# Patient Record
Sex: Female | Born: 1975 | Race: Black or African American | Hispanic: No | Marital: Married | Smoking: Never smoker
Health system: Southern US, Community
[De-identification: ages and names within clinical notes are randomized; demographics above are authoritative.]

## PROBLEM LIST (undated history)

## (undated) DIAGNOSIS — M1990 Unspecified osteoarthritis, unspecified site: Secondary | ICD-10-CM

## (undated) DIAGNOSIS — T7840XA Allergy, unspecified, initial encounter: Secondary | ICD-10-CM

## (undated) DIAGNOSIS — M549 Dorsalgia, unspecified: Secondary | ICD-10-CM

## (undated) DIAGNOSIS — D649 Anemia, unspecified: Secondary | ICD-10-CM

## (undated) DIAGNOSIS — F419 Anxiety disorder, unspecified: Secondary | ICD-10-CM

## (undated) DIAGNOSIS — E559 Vitamin D deficiency, unspecified: Secondary | ICD-10-CM

## (undated) DIAGNOSIS — J45909 Unspecified asthma, uncomplicated: Secondary | ICD-10-CM

## (undated) DIAGNOSIS — M2550 Pain in unspecified joint: Secondary | ICD-10-CM

## (undated) DIAGNOSIS — M7989 Other specified soft tissue disorders: Secondary | ICD-10-CM

## (undated) DIAGNOSIS — Z91018 Allergy to other foods: Secondary | ICD-10-CM

## (undated) HISTORY — DX: Other specified soft tissue disorders: M79.89

## (undated) HISTORY — PX: COSMETIC SURGERY: SHX468

## (undated) HISTORY — DX: Anemia, unspecified: D64.9

## (undated) HISTORY — DX: Vitamin D deficiency, unspecified: E55.9

## (undated) HISTORY — DX: Unspecified osteoarthritis, unspecified site: M19.90

## (undated) HISTORY — DX: Allergy to other foods: Z91.018

## (undated) HISTORY — DX: Pain in unspecified joint: M25.50

## (undated) HISTORY — DX: Dorsalgia, unspecified: M54.9

## (undated) HISTORY — DX: Allergy, unspecified, initial encounter: T78.40XA

## (undated) HISTORY — DX: Anxiety disorder, unspecified: F41.9

---

## 2001-10-09 HISTORY — PX: GASTRIC BYPASS: SHX52

## 2012-04-11 ENCOUNTER — Encounter (HOSPITAL_BASED_OUTPATIENT_CLINIC_OR_DEPARTMENT_OTHER): Payer: Self-pay | Admitting: *Deleted

## 2012-04-11 ENCOUNTER — Emergency Department (HOSPITAL_BASED_OUTPATIENT_CLINIC_OR_DEPARTMENT_OTHER)
Admission: EM | Admit: 2012-04-11 | Discharge: 2012-04-11 | Disposition: A | Discharge status: Home / Self Care | Payer: No Typology Code available for payment source | Attending: Emergency Medicine | Admitting: Emergency Medicine

## 2012-04-11 DIAGNOSIS — Z91013 Allergy to seafood: Secondary | ICD-10-CM | POA: Insufficient documentation

## 2012-04-11 DIAGNOSIS — M542 Cervicalgia: Secondary | ICD-10-CM | POA: Insufficient documentation

## 2012-04-11 DIAGNOSIS — M7918 Myalgia, other site: Secondary | ICD-10-CM

## 2012-04-11 DIAGNOSIS — J45909 Unspecified asthma, uncomplicated: Secondary | ICD-10-CM | POA: Insufficient documentation

## 2012-04-11 HISTORY — DX: Unspecified asthma, uncomplicated: J45.909

## 2012-04-11 MED ORDER — DIAZEPAM 5 MG PO TABS
5.0000 mg | ORAL_TABLET | Freq: Once | ORAL | Status: AC
  Administered 2012-04-11: 5 mg via ORAL
  Filled 2012-04-11: qty 1

## 2012-04-11 MED ORDER — IBUPROFEN 600 MG PO TABS: 600.0000 mg | ORAL_TABLET | Freq: Four times a day (QID) | ORAL | Status: AC | PRN

## 2012-04-11 MED ORDER — DIAZEPAM 5 MG PO TABS: 5.0000 mg | ORAL_TABLET | Freq: Two times a day (BID) | ORAL | Status: AC

## 2012-04-11 MED ORDER — OXYCODONE-ACETAMINOPHEN 5-325 MG PO TABS
1.0000 | ORAL_TABLET | Freq: Once | ORAL | Status: AC
  Administered 2012-04-11: 1 via ORAL
  Filled 2012-04-11: qty 1

## 2012-04-11 MED ORDER — ONDANSETRON 4 MG PO TBDP
4.0000 mg | ORAL_TABLET | Freq: Once | ORAL | Status: AC
  Administered 2012-04-11: 4 mg via ORAL
  Filled 2012-04-11: qty 1

## 2012-04-11 MED ORDER — OXYCODONE-ACETAMINOPHEN 5-325 MG PO TABS: 1.0000 | ORAL_TABLET | ORAL | Status: AC | PRN
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
## 2012-10-09 HISTORY — PX: KNEE SURGERY: SHX244

## 2013-10-21 ENCOUNTER — Ambulatory Visit (INDEPENDENT_AMBULATORY_CARE_PROVIDER_SITE_OTHER): Payer: BC Managed Care – PPO | Admitting: Obstetrics & Gynecology

## 2013-10-21 ENCOUNTER — Encounter: Payer: Self-pay | Admitting: Obstetrics & Gynecology

## 2013-10-21 VITALS — BP 126/87 | HR 96 | Resp 16 | Ht 66.0 in | Wt 297.0 lb

## 2013-10-21 DIAGNOSIS — Z0000 Encounter for general adult medical examination without abnormal findings: Secondary | ICD-10-CM

## 2013-10-21 DIAGNOSIS — Z124 Encounter for screening for malignant neoplasm of cervix: Secondary | ICD-10-CM

## 2013-10-21 DIAGNOSIS — Z01419 Encounter for gynecological examination (general) (routine) without abnormal findings: Secondary | ICD-10-CM

## 2013-10-21 DIAGNOSIS — Z1151 Encounter for screening for human papillomavirus (HPV): Secondary | ICD-10-CM

## 2013-10-21 DIAGNOSIS — Z23 Encounter for immunization: Secondary | ICD-10-CM

## 2013-10-21 DIAGNOSIS — Z113 Encounter for screening for infections with a predominantly sexual mode of transmission: Secondary | ICD-10-CM

## 2013-10-21 MED ORDER — NORETHINDRONE-ETH ESTRADIOL 0.4-35 MG-MCG PO TABS: ORAL_TABLET | ORAL | Status: DC

## 2013-10-21 NOTE — Progress Notes (Signed)
Subjective:    Lisa Tran is a 38 y.o. female who presents for an annual exam. The patient has no complaints today except for some dysuria since the beginning of the year. She tried OTC UTI pills with some help. The patient is sexually active. GYN screening history: last pap: was normal. The patient wears seatbelts: yes. The patient participates in regular exercise: yes. (zumba) Has the patient ever been transfused or tattooed?: no. The patient reports that there is not domestic violence in her life.   Menstrual History: OB History   Grav Para Term Preterm Abortions TAB SAB Ect Mult Living   0 0 0 0 0 0 0 0 0 0       Menarche age: 32 [place]: 45   Patient's last menstrual period was 10/07/2013.    The following portions of the patient's history were reviewed and updated as appropriate: allergies, current medications, past family history, past medical history, past social history, past surgical history and problem list.  Review of Systems A comprehensive review of systems was negative. Married for 7 years, denies dyspareunia. Uses OTC for contraception. She wants a flu vaccine soon, when she doesn't have a cold. 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 No mammogram ever.   Objective:    BP 126/87  Pulse 96  Resp 16  Ht 5\' 6"  (1.676 m)  Wt 297 lb (134.718 kg)  BMI 47.96 kg/m2  LMP 10/07/2013  General Appearance:    Alert, cooperative, no distress, appears stated age  Head:    Normocephalic, without obvious abnormality, atraumatic  Eyes:    PERRL, conjunctiva/corneas clear, EOM's intact, fundi    benign, both eyes  Ears:    Normal TM's and external ear canals, both ears  Nose:   Nares normal, septum midline, mucosa normal, no drainage    or sinus tenderness  Throat:   Lips, mucosa, and tongue normal; teeth and gums normal  Neck:   Supple, symmetrical, trachea midline, no adenopathy;    thyroid:  no enlargement/tenderness/nodules; no carotid   bruit or JVD  Back:      Symmetric, no curvature, ROM normal, no CVA tenderness  Lungs:     Clear to auscultation bilaterally, respirations unlabored  Chest Wall:    No tenderness or deformity   Heart:    Regular rate and rhythm, S1 and S2 normal, no murmur, rub   or gallop  Breast Exam:    No tenderness, masses, or nipple abnormality  Abdomen:     Soft, non-tender, bowel sounds active all four quadrants,    no masses, no organomegaly  Genitalia:    Normal female without lesion, discharge or tenderness, NSSmid plane, NT, mobile, no adnexal masses appreciated.     Extremities:   Extremities normal, atraumatic, no cyanosis or edema  Pulses:   2+ and symmetric all extremities  Skin:   Skin color, texture, turgor normal, no rashes or lesions  Lymph nodes:   Cervical, supraclavicular, and axillary nodes normal  Neurologic:   CNII-XII intact, normal strength, sensation and reflexes    throughout  .    Assessment:    Healthy female exam.    Plan:     Breast self exam technique reviewed and patient encouraged to perform self-exam monthly. Chlamydia specimen. GC specimen. Mammogram. Thin prep Pap smear. with cotesting Refer to bariatric center Flu vaccine when well Urine culture

## 2013-10-21 NOTE — Patient Instructions (Signed)

## 2013-10-23 LAB — CULTURE, URINE COMPREHENSIVE
Colony Count: NO GROWTH
Organism ID, Bacteria: NO GROWTH

## 2013-11-03 ENCOUNTER — Other Ambulatory Visit: Payer: Self-pay | Admitting: *Deleted

## 2013-11-03 DIAGNOSIS — IMO0001 Reserved for inherently not codable concepts without codable children: Secondary | ICD-10-CM

## 2013-11-03 MED ORDER — NORETHINDRONE-ETH ESTRADIOL 0.4-35 MG-MCG PO TABS: ORAL_TABLET | ORAL | Status: DC

## 2013-11-03 NOTE — Telephone Encounter (Signed)
Pt called stating that her OCP RX was not at the [facility], even though there is a confirmation receipt.  RX resent to [facility] per original orders.

## 2013-12-15 ENCOUNTER — Ambulatory Visit (HOSPITAL_COMMUNITY)
Admission: RE | Admit: 2013-12-15 | Discharge: 2013-12-15 | Disposition: A | Discharge status: Home / Self Care | Payer: BC Managed Care – PPO | Source: Ambulatory Visit | Attending: Obstetrics & Gynecology | Admitting: Obstetrics & Gynecology

## 2013-12-15 DIAGNOSIS — Z1231 Encounter for screening mammogram for malignant neoplasm of breast: Secondary | ICD-10-CM | POA: Insufficient documentation

## 2013-12-15 DIAGNOSIS — Z0000 Encounter for general adult medical examination without abnormal findings: Secondary | ICD-10-CM

## 2014-11-13 ENCOUNTER — Other Ambulatory Visit: Payer: Self-pay | Admitting: Obstetrics & Gynecology

## 2014-11-13 DIAGNOSIS — Z3041 Encounter for surveillance of contraceptive pills: Secondary | ICD-10-CM

## 2014-11-23 ENCOUNTER — Ambulatory Visit: Payer: Self-pay | Admitting: Obstetrics & Gynecology

## 2014-12-08 ENCOUNTER — Other Ambulatory Visit: Payer: Self-pay | Admitting: General Practice

## 2014-12-08 DIAGNOSIS — Z1231 Encounter for screening mammogram for malignant neoplasm of breast: Secondary | ICD-10-CM

## 2014-12-23 ENCOUNTER — Ambulatory Visit (INDEPENDENT_AMBULATORY_CARE_PROVIDER_SITE_OTHER): Payer: BLUE CROSS/BLUE SHIELD | Admitting: Obstetrics & Gynecology

## 2014-12-23 ENCOUNTER — Ambulatory Visit (INDEPENDENT_AMBULATORY_CARE_PROVIDER_SITE_OTHER): Payer: BLUE CROSS/BLUE SHIELD

## 2014-12-23 ENCOUNTER — Encounter: Payer: Self-pay | Admitting: Obstetrics & Gynecology

## 2014-12-23 VITALS — BP 114/74 | HR 77 | Resp 16 | Ht 66.0 in | Wt 302.0 lb

## 2014-12-23 DIAGNOSIS — Z1231 Encounter for screening mammogram for malignant neoplasm of breast: Secondary | ICD-10-CM

## 2014-12-23 DIAGNOSIS — Z01419 Encounter for gynecological examination (general) (routine) without abnormal findings: Secondary | ICD-10-CM

## 2014-12-23 DIAGNOSIS — Z3041 Encounter for surveillance of contraceptive pills: Secondary | ICD-10-CM | POA: Diagnosis not present

## 2014-12-23 DIAGNOSIS — Z124 Encounter for screening for malignant neoplasm of cervix: Secondary | ICD-10-CM | POA: Diagnosis not present

## 2014-12-23 DIAGNOSIS — N760 Acute vaginitis: Secondary | ICD-10-CM

## 2014-12-23 DIAGNOSIS — R922 Inconclusive mammogram: Secondary | ICD-10-CM

## 2014-12-23 DIAGNOSIS — A499 Bacterial infection, unspecified: Secondary | ICD-10-CM | POA: Diagnosis not present

## 2014-12-23 DIAGNOSIS — Z0000 Encounter for general adult medical examination without abnormal findings: Secondary | ICD-10-CM

## 2014-12-23 DIAGNOSIS — Z1151 Encounter for screening for human papillomavirus (HPV): Secondary | ICD-10-CM

## 2014-12-23 DIAGNOSIS — B9689 Other specified bacterial agents as the cause of diseases classified elsewhere: Secondary | ICD-10-CM

## 2014-12-23 LAB — WET PREP FOR TRICH, YEAST, CLUE
Trich, Wet Prep: NONE SEEN
Yeast Wet Prep HPF POC: NONE SEEN

## 2014-12-23 MED ORDER — METRONIDAZOLE 500 MG PO TABS: 500.0000 mg | ORAL_TABLET | Freq: Two times a day (BID) | ORAL | Status: DC

## 2014-12-23 MED ORDER — NORETHINDRONE-ETH ESTRADIOL 0.4-35 MG-MCG PO TABS: ORAL_TABLET | ORAL | Status: DC

## 2014-12-23 NOTE — Progress Notes (Signed)
Subjective:    Lisa Tran is a 39 y.o. M AA P0  female who presents for an annual exam. The patient has no complaints today. She needs a refill of her OCPs.  The patient is sexually active. GYN screening history: last pap: was normal. The patient wears seatbelts: yes. The patient participates in regular exercise: yes. Has the patient ever been transfused or tattooed?: no. The patient reports that there is not domestic violence in her life.   Menstrual History: OB History    Gravida Para Term Preterm AB TAB SAB Ectopic Multiple Living   0 0 0 0 0 0 0 0 0 0       Menarche age: 40  Patient's last menstrual period was 12/14/2014.    The following portions of the patient's history were reviewed and updated as appropriate: allergies, current medications, past family history, past medical history, past social history, past surgical history and problem list.  Review of Systems Pertinent items are noted in HPI. Married for 8 years.Declines a flu vaccine.   Objective:    BP 114/74 mmHg  Pulse 77  Resp 16  Ht 5\' 6"  (1.676 m)  Wt 302 lb (136.986 kg)  BMI 48.77 kg/m2  LMP 12/14/2014  General Appearance:    Alert, cooperative, no distress, appears stated age  Head:    Normocephalic, without obvious abnormality, atraumatic  Eyes:    PERRL, conjunctiva/corneas clear, EOM's intact, fundi    benign, both eyes  Ears:    Normal TM's and external ear canals, both ears  Nose:   Nares normal, septum midline, mucosa normal, no drainage    or sinus tenderness  Throat:   Lips, mucosa, and tongue normal; teeth and gums normal  Neck:   Supple, symmetrical, trachea midline, no adenopathy;    thyroid:  no enlargement/tenderness/nodules; no carotid   bruit or JVD  Back:     Symmetric, no curvature, ROM normal, no CVA tenderness  Lungs:     Clear to auscultation bilaterally, respirations unlabored  Chest Wall:    No tenderness or deformity   Heart:    Regular rate and rhythm, S1 and S2 normal, no  murmur, rub   or gallop  Breast Exam:    No tenderness, masses, or nipple abnormality  Abdomen:     Soft, non-tender, bowel sounds active all four quadrants,    no masses, no organomegaly  Genitalia:    Normal female without lesion, discharge or tenderness, frothy vaginal discharge, NSSmid plane, NT, no palpable adnexal masses     Extremities:   Extremities normal, atraumatic, no cyanosis or edema  Pulses:   2+ and symmetric all extremities  Skin:   Skin color, texture, turgor normal, no rashes or lesions  Lymph nodes:   Cervical, supraclavicular, and axillary nodes normal  Neurologic:   CNII-XII intact, normal strength, sensation and reflexes    throughout  .    Assessment:    Healthy female exam.  Probable BV Plan:     Breast self exam technique reviewed and patient encouraged to perform self-exam monthly. Mammogram. Thin prep Pap smear. with cotesting Treat with flagyl Refer to bariatrics

## 2014-12-23 NOTE — Addendum Note (Signed)
Addended by: Asencion Islam on: 12/23/2014 10:01 AM   Modules accepted: Orders

## 2014-12-24 ENCOUNTER — Other Ambulatory Visit: Payer: Self-pay | Admitting: General Practice

## 2014-12-24 ENCOUNTER — Ambulatory Visit: Payer: Self-pay | Admitting: Obstetrics & Gynecology

## 2014-12-24 ENCOUNTER — Telehealth: Payer: Self-pay | Admitting: *Deleted

## 2014-12-24 DIAGNOSIS — R928 Other abnormal and inconclusive findings on diagnostic imaging of breast: Secondary | ICD-10-CM

## 2014-12-24 LAB — CYTOLOGY - PAP

## 2014-12-24 NOTE — Telephone Encounter (Signed)
Pt notified of Wet prep results and she has started her Flagyl

## 2015-01-01 ENCOUNTER — Ambulatory Visit
Admission: RE | Admit: 2015-01-01 | Discharge: 2015-01-01 | Disposition: A | Discharge status: Home / Self Care | Payer: BLUE CROSS/BLUE SHIELD | Source: Ambulatory Visit | Attending: General Practice | Admitting: General Practice

## 2015-01-01 DIAGNOSIS — R928 Other abnormal and inconclusive findings on diagnostic imaging of breast: Secondary | ICD-10-CM

## 2015-02-25 DIAGNOSIS — Z9889 Other specified postprocedural states: Secondary | ICD-10-CM | POA: Insufficient documentation

## 2015-02-25 DIAGNOSIS — Z9884 Bariatric surgery status: Secondary | ICD-10-CM | POA: Insufficient documentation

## 2015-03-16 DIAGNOSIS — E611 Iron deficiency: Secondary | ICD-10-CM | POA: Insufficient documentation

## 2015-09-28 ENCOUNTER — Emergency Department
Admission: EM | Admit: 2015-09-28 | Discharge: 2015-09-28 | Disposition: A | Discharge status: Home / Self Care | Payer: BLUE CROSS/BLUE SHIELD | Source: Home / Self Care | Attending: Family Medicine | Admitting: Family Medicine

## 2015-09-28 ENCOUNTER — Encounter: Payer: Self-pay | Admitting: *Deleted

## 2015-09-28 ENCOUNTER — Emergency Department (INDEPENDENT_AMBULATORY_CARE_PROVIDER_SITE_OTHER): Payer: BLUE CROSS/BLUE SHIELD

## 2015-09-28 DIAGNOSIS — M542 Cervicalgia: Secondary | ICD-10-CM

## 2015-09-28 MED ORDER — CYCLOBENZAPRINE HCL 10 MG PO TABS: ORAL_TABLET | ORAL | Status: DC

## 2015-09-28 MED ORDER — NITROFURANTOIN MONOHYD MACRO 100 MG PO CAPS: 100.0000 mg | ORAL_CAPSULE | Freq: Two times a day (BID) | ORAL | Status: DC

## 2015-09-28 MED ORDER — PREDNISONE 20 MG PO TABS: 20.0000 mg | ORAL_TABLET | Freq: Two times a day (BID) | ORAL | Status: DC

## 2015-09-28 NOTE — ED Notes (Signed)
Pt c/o almost 2 weeks of neck pain without injury. Taken bayer and Aleve and used icy hot and heat with minimal relief.

## 2015-09-28 NOTE — ED Provider Notes (Signed)
CSN: [id number]     Arrival date & time 09/28/15  1325 History   First MD Initiated Contact with Patient 09/28/15 1332     Chief Complaint  Patient presents with  . Neck Pain      HPI Comments: Patient complains of onset of left side neck pain two weeks ago that became worse yesterday.  The pain radiates into her left trapezius area.  She recalls no injury or change in physical activities.  She has difficulty sleeping as a result of the pain.  She has developed intermittent lancinating pain with certain movements.  Her left neck pain is exacerbated by rotating her neck to the left, and flexing her neck laterally to the right.  Patient is a 39 y.o. female presenting with neck injury. The history is provided by the patient.  Neck Injury This is a new problem. Episode onset: 2 weeks ago. The problem occurs constantly. The problem has been gradually worsening. Exacerbated by: neck rotation. Nothing relieves the symptoms. She has tried a warm compress (Aleve) for the symptoms. The treatment provided mild relief.    Past Medical History  Diagnosis Date  . Asthma    Past Surgical History  Procedure Laterality Date  . Cosmetic surgery     Family History  Problem Relation Age of Onset  . Diabetes Maternal Aunt   . Diabetes Maternal Grandfather   . Hypertension Maternal Aunt    Social History  Substance Use Topics  . Smoking status: Never Smoker   . Smokeless tobacco: Never Used  . Alcohol Use: Yes   OB History    Gravida Para Term Preterm AB TAB SAB Ectopic Multiple Living   0 0 0 0 0 0 0 0 0 0      Review of Systems  Constitutional: Negative for fever, fatigue and unexpected weight change.  All other systems reviewed and are negative.   Allergies  Shellfish allergy  Home Medications   Prior to Admission medications   Medication Sig Start Date End Date Taking? Authorizing Provider  albuterol (PROVENTIL HFA;VENTOLIN HFA) 108 (90 BASE) MCG/ACT inhaler Inhale 2 puffs into the  lungs every 6 (six) hours as needed. For shortness of breath or wheezing    Historical Provider, MD  cetirizine (ZYRTEC) 10 MG tablet Take 10 mg by mouth daily.    Historical Provider, MD  Cholecalciferol (VITAMIN D3) 3000 UNITS TABS Take 1 tablet by mouth daily.    Historical Provider, MD  cyclobenzaprine (FLEXERIL) 10 MG tablet Take one tab by mouth at bedtime for muscle spasm 09/28/15   Kandra Nicolas, MD  Multiple Vitamin (MULTIVITAMIN WITH MINERALS) TABS Take 1 tablet by mouth daily.    Historical Provider, MD  NON FORMULARY 1 capsule daily.    Historical Provider, MD  norethindrone-ethinyl estradiol Hulda Humphrey) 0.4-35 MG-MCG tablet TAKE 1 TABLET DAILY 12/23/14   Emily Filbert, MD  predniSONE (DELTASONE) 20 MG tablet Take 1 tablet (20 mg total) by mouth 2 (two) times daily. Take with food. 09/28/15   Kandra Nicolas, MD   Meds Ordered and Administered this Visit  Medications - No data to display  BP 125/81 mmHg  Pulse 84  Resp 16  Ht 5\' 6"  (1.676 m)  Wt 298 lb (135.172 kg)  BMI 48.12 kg/m2  SpO2 100%  LMP 09/19/2015 No data found.   Physical Exam  Constitutional: She is oriented to person, place, and time. She appears well-developed and well-nourished. No distress.  Patient is obese (BMI 48.1)  HENT:  Head: Normocephalic.  Nose: Nose normal.  Mouth/Throat: Oropharynx is clear and moist.  Eyes: Conjunctivae are normal. Pupils are equal, round, and reactive to light.  Neck: Neck supple.    Area of patient's pain outlined; no tenderness to palpation in this area.  Distal neurovascular function is intact.   Cardiovascular: Normal heart sounds.   Pulmonary/Chest: Breath sounds normal.  Lymphadenopathy:    She has no cervical adenopathy.  Neurological: She is alert and oriented to person, place, and time.  Skin: Skin is warm and dry. No rash noted.  Nursing note and vitals reviewed.   ED Course  Procedures None   Imaging Review Dg Cervical Spine Complete  09/28/2015   CLINICAL DATA:  Pain.  No known injury. EXAM: CERVICAL SPINE - COMPLETE 4+ VIEW COMPARISON:  No prior FINDINGS: Mild loss of normal cervical lordosis noted . This may be related to positioning or torticollis. No acute bony abnormality identified. No evidence of fracture or dislocation. Pulmonary apices are clear. IMPRESSION: 1. No acute bony abnormality. 2. Mild loss of normal cervical lordosis. This may be related to positioning or torticollis. Electronically Signed   By: Marcello Moores  Register   On: 09/28/2015 14:18    MDM   1. Cervical pain (neck); ?radiculopathy      Begin prednisone burst, and Flexeril 10mg  HS. Try applying ice pack 2 to 3 times daily, or ice pack alternating with heat.  May take Extra Strength Tylenol as needed for pain.  After finishing prednisone, may resume taking 2 Aleve tabs every 12 hours. Followup with Dr. Lynne Leader ([facility]) in one week     Kandra Nicolas, MD 09/28/15 [phone]

## 2015-09-28 NOTE — Discharge Instructions (Signed)
Try applying ice pack 2 to 3 times daily, or ice pack alternating with heat.  May take Extra Strength Tylenol as needed for pain.  After finishing prednisone, may resume taking 2 Aleve tabs every 12 hours.

## 2015-09-30 ENCOUNTER — Telehealth: Payer: Self-pay

## 2015-10-05 ENCOUNTER — Encounter: Payer: Self-pay | Admitting: Family Medicine

## 2015-10-05 ENCOUNTER — Ambulatory Visit (INDEPENDENT_AMBULATORY_CARE_PROVIDER_SITE_OTHER): Payer: BLUE CROSS/BLUE SHIELD | Admitting: Family Medicine

## 2015-10-05 VITALS — BP 127/98 | HR 98 | Ht 66.0 in | Wt 311.0 lb

## 2015-10-05 DIAGNOSIS — M6249 Contracture of muscle, multiple sites: Secondary | ICD-10-CM

## 2015-10-05 DIAGNOSIS — M62838 Other muscle spasm: Secondary | ICD-10-CM | POA: Insufficient documentation

## 2015-10-05 MED ORDER — CYCLOBENZAPRINE HCL 10 MG PO TABS: ORAL_TABLET | ORAL | Status: DC

## 2015-10-05 NOTE — Assessment & Plan Note (Signed)
Plan for PT and refill flexeril.  Recheck in 4 weeks or sooner if needed.

## 2015-10-05 NOTE — Patient Instructions (Signed)
Thank you for coming in today. Come back or go to the emergency room if you notice new weakness new numbness problems walking or bowel or bladder problems. Cervical Sprain A cervical sprain is an injury in the neck in which the strong, fibrous tissues (ligaments) that connect your neck bones stretch or tear. Cervical sprains can range from mild to severe. Severe cervical sprains can cause the neck vertebrae to be unstable. This can lead to damage of the spinal cord and can result in serious nervous system problems. The amount of time it takes for a cervical sprain to get better depends on the cause and extent of the injury. Most cervical sprains heal in 1 to 3 weeks. CAUSES  Severe cervical sprains may be caused by:   Contact sport injuries (such as from football, rugby, wrestling, hockey, auto racing, gymnastics, diving, martial arts, or boxing).   Motor vehicle collisions.   Whiplash injuries. This is an injury from a sudden forward and backward whipping movement of the head and neck.  Falls.  Mild cervical sprains may be caused by:   Being in an awkward position, such as while cradling a telephone between your ear and shoulder.   Sitting in a chair that does not offer proper support.   Working at a poorly Landscape architect station.   Looking up or down for long periods of time.  SYMPTOMS   Pain, soreness, stiffness, or a burning sensation in the front, back, or sides of the neck. This discomfort may develop immediately after the injury or slowly, 24 hours or more after the injury.   Pain or tenderness directly in the middle of the back of the neck.   Shoulder or upper back pain.   Limited ability to move the neck.   Headache.   Dizziness.   Weakness, numbness, or tingling in the hands or arms.   Muscle spasms.   Difficulty swallowing or chewing.   Tenderness and swelling of the neck.  DIAGNOSIS  Most of the time your health care provider can diagnose  a cervical sprain by taking your history and doing a physical exam. Your health care provider will ask about previous neck injuries and any known neck problems, such as arthritis in the neck. X-rays may be taken to find out if there are any other problems, such as with the bones of the neck. Other tests, such as a CT scan or MRI, may also be needed.  TREATMENT  Treatment depends on the severity of the cervical sprain. Mild sprains can be treated with rest, keeping the neck in place (immobilization), and pain medicines. Severe cervical sprains are immediately immobilized. Further treatment is done to help with pain, muscle spasms, and other symptoms and may include:  Medicines, such as pain relievers, numbing medicines, or muscle relaxants.   Physical therapy. This may involve stretching exercises, strengthening exercises, and posture training. Exercises and improved posture can help stabilize the neck, strengthen muscles, and help stop symptoms from returning.  HOME CARE INSTRUCTIONS   Put ice on the injured area.   Put ice in a plastic bag.   Place a towel between your skin and the bag.   Leave the ice on for 15-20 minutes, 3-4 times a day.   If your injury was severe, you may have been given a cervical collar to wear. A cervical collar is a two-piece collar designed to keep your neck from moving while it heals.  Do not remove the collar unless instructed by your health  care provider.  If you have long hair, keep it outside of the collar.  Ask your health care provider before making any adjustments to your collar. Minor adjustments may be required over time to improve comfort and reduce pressure on your chin or on the back of your head.  Ifyou are allowed to remove the collar for cleaning or bathing, follow your health care provider's instructions on how to do so safely.  Keep your collar clean by wiping it with mild soap and water and drying it completely. If the collar you have  been given includes removable pads, remove them every 1-2 days and hand wash them with soap and water. Allow them to air dry. They should be completely dry before you wear them in the collar.  If you are allowed to remove the collar for cleaning and bathing, wash and dry the skin of your neck. Check your skin for irritation or sores. If you see any, tell your health care provider.  Do not drive while wearing the collar.   Only take over-the-counter or prescription medicines for pain, discomfort, or fever as directed by your health care provider.   Keep all follow-up appointments as directed by your health care provider.   Keep all physical therapy appointments as directed by your health care provider.   Make any needed adjustments to your workstation to promote good posture.   Avoid positions and activities that make your symptoms worse.   Warm up and stretch before being active to help prevent problems.  SEEK MEDICAL CARE IF:   Your pain is not controlled with medicine.   You are unable to decrease your pain medicine over time as planned.   Your activity level is not improving as expected.  SEEK IMMEDIATE MEDICAL CARE IF:   You develop any bleeding.  You develop stomach upset.  You have signs of an allergic reaction to your medicine.   Your symptoms get worse.   You develop new, unexplained symptoms.   You have numbness, tingling, weakness, or paralysis in any part of your body.  MAKE SURE YOU:   Understand these instructions.  Will watch your condition.  Will get help right away if you are not doing well or get worse.   This information is not intended to replace advice given to you by your health care provider. Make sure you discuss any questions you have with your health care provider.   Document Released: 07/23/2007 Document Revised: 09/30/2013 Document Reviewed: 04/02/2013 Elsevier Interactive Patient Education [employer].

## 2015-10-05 NOTE — Progress Notes (Signed)
   Subjective:    I'm seeing this patient as a consultation for:  Dr. Assunta Found  CC: Neck pain  HPI: Patient was seen in urgent care on December 20 for left lateral neck and trapezius pain. X-ray showed loss of cervical lordosis but otherwise was unremarkable. She was given prednisone and Flexeril which has helped a lot. She states she is approximately 90% better. She notes some mild stiffness and pain in her left lateral trapezius. She denies no weakness or numbness fevers chills nausea vomiting or diarrhea. She has finished her prednisone and Flexeril prescriptions.  Past medical history, Surgical history, Family history not pertinant except as noted below, Social history, Allergies, and medications have been entered into the medical record, reviewed, and no changes needed.   Review of Systems: No headache, visual changes, nausea, vomiting, diarrhea, constipation, dizziness, abdominal pain, skin rash, fevers, chills, night sweats, weight loss, swollen lymph nodes, body aches, joint swelling, muscle aches, chest pain, shortness of breath, mood changes, visual or auditory hallucinations.   Objective:    Filed Vitals:   10/05/15 0828  BP: 127/98  Pulse: 98   General: Well Developed, well nourished, and in no acute distress.  Neuro/Psych: Alert and oriented x3, extra-ocular muscles intact, able to move all 4 extremities, sensation grossly intact. Skin: Warm and dry, no rashes noted.  Respiratory: Not using accessory muscles, speaking in full sentences, trachea midline.  Cardiovascular: Pulses palpable, no extremity edema. Abdomen: Does not appear distended. MSK: Neck: Nontender to spinal midline. Tender to palpation left lateral trapezius. Normal neck range of motion. Upper extremity strength reflexes sensation are intact and equal bilateral upper extremities. Pulses capillary refill and sensation are intact bilateral upper extremities.  X-ray C-spine dated 09/28/2015 reviewed  No results  found for this or any previous visit (from the past 24 hour(s)). No results found.  Impression and Recommendations:   This case required medical decision making of moderate complexity.

## 2015-10-13 ENCOUNTER — Encounter: Payer: Self-pay | Admitting: Rehabilitative and Restorative Service Providers"

## 2015-10-13 ENCOUNTER — Ambulatory Visit (INDEPENDENT_AMBULATORY_CARE_PROVIDER_SITE_OTHER): Payer: BLUE CROSS/BLUE SHIELD | Admitting: Rehabilitative and Restorative Service Providers"

## 2015-10-13 DIAGNOSIS — R293 Abnormal posture: Secondary | ICD-10-CM

## 2015-10-13 DIAGNOSIS — M539 Dorsopathy, unspecified: Secondary | ICD-10-CM

## 2015-10-13 DIAGNOSIS — Z7409 Other reduced mobility: Secondary | ICD-10-CM | POA: Diagnosis not present

## 2015-10-13 DIAGNOSIS — M542 Cervicalgia: Secondary | ICD-10-CM

## 2015-10-13 DIAGNOSIS — R29898 Other symptoms and signs involving the musculoskeletal system: Secondary | ICD-10-CM

## 2015-10-13 NOTE — Patient Instructions (Addendum)
Self massage using 4 inch rubber ball  All sport ball from [employer] for dollar store; [employer]; [employer]; etc  Axial Extension (Chin Tuck)    Pull chin in and lengthen back of neck. Hold __10-15__ seconds while counting out loud. Repeat _5___ times. Do __4-5__ sessions per day.  Extensors, Supine    Lie supine, head on small, rolled towel. Gently tuck chin and bring toward chest. Hold _10-15__ seconds. Repeat __5_ times per session. Do _3-4 sessions per day.  Side Bend, Sitting    Sit, head in comfortable, centered position, chin slightly tucked. Gently tilt head, bringing ear toward same-side shoulder. Hold _10__ seconds.  Repeat ___ times per session. Do ___ sessions per day.   Shoulder Blade Squeeze    Rotate shoulders back, then squeeze shoulder blades down and back.Hold 10 sec  Repeat __10__ times. Do _several___ sessions per day.   Scapula Adduction With Pectoralis Stretch: Low - Standing   Shoulders at 45 hands even with shoulders, keeping weight through legs, shift weight forward until you feel pull or stretch through the front of your chest. Hold _30__ seconds. Do _3__ times, _2-4__ times per day.   Scapula Adduction With Pectoralis Stretch: Mid-Range - Standing   Shoulders at 90 elbows even with shoulders, keeping weight through legs, shift weight forward until you feel pull or strength through the front of your chest. Hold __30_ seconds. Do _3__ times, __2-4_ times per day.   Scapula Adduction With Pectoralis Stretch: High - Standing   Shoulders at 120 hands up high on the doorway, keeping weight on feet, shift weight forward until you feel pull or stretch through the front of your chest. Hold _30__ seconds. Do _3__ times, _2-3__ times per day.  Lying with arms at sides 90 - 120 degrees  Stay for ~5 min   Side Twist, Supine (Non-Weight Bearing)    Lie on back, feet flat on floor, arms out to side. Slowly rock knees from side to  side in small, pain-free range of motion. Allow lower back to rotate Repeat __3-5_ times per session. Do _2-3_ sessions per day.   TENS UNIT: This is helpful for muscle pain and spasm.   Search and Purchase a TENS 7000 2nd edition at www.tenspros.com. It should be less than $30.     TENS unit instructions: Do not shower or bathe with the unit on Turn the unit off before removing electrodes or batteries If the electrodes lose stickiness add a drop of water to the electrodes after they are disconnected from the unit and place on plastic sheet. If you continued to have difficulty, call the TENS unit company to purchase more electrodes. Do not apply lotion on the skin area prior to use. Make sure the skin is clean and dry as this will help prolong the life of the electrodes. After use, always check skin for unusual red areas, rash or other skin difficulties. If there are any skin problems, does not apply electrodes to the same area. Never remove the electrodes from the unit by pulling the wires. Do not use the TENS unit or electrodes other than as directed. Do not change electrode placement without consultating your therapist or physician. Keep 2 fingers with between each electrode. Wear time ratio is 2:1, on to off times.    For example on for 30 minutes off for 15 minutes and then on for 30 minutes off for 15 minutes

## 2015-10-13 NOTE — Therapy (Signed)
[place] [facility] [place], [place], [address] Phone: [phone]   Fax:  [phone]  Physical Therapy Evaluation  Patient Details  Name: Lisa Tran MRN: [id number] Date of Birth: 1976/06/22 Referring Provider: Georgina Snell  Encounter Date: 10/13/2015      PT End of Session - 10/13/15 0809    Visit Number 1   Number of Visits 4   Date for PT Re-Evaluation 11/10/15   PT Start Time 0709   PT Stop Time 0813   PT Time Calculation (min) 64 min   Activity Tolerance Patient tolerated treatment well      Past Medical History  Diagnosis Date  . Asthma     Past Surgical History  Procedure Laterality Date  . Cosmetic surgery      There were no vitals filed for this visit.  Visit Diagnosis:  Cervical pain - Plan: PT plan of care cert/re-cert  Tightness of neck - Plan: PT plan of care cert/re-cert  Abnormal posture - Plan: PT plan of care cert/re-cert  Decreased mobility and endurance - Plan: PT plan of care cert/re-cert      Subjective Assessment - 10/13/15 0715    Subjective Patient reports that she noted Lt cervical pain ~3 weeks ago with sympotms gradually increasing over the next week and became sharp and pt was unable to sleep. She was seen be MD and treated with meds with good improvement. She continues to have some symptoms.    Pertinent History Denies any medical problems    How long can you sit comfortably? no limit   How long can you stand comfortably? no limit   How long can you walk comfortably? no limit   Diagnostic tests x-ray - cervical muscle spams    Patient Stated Goals improve neck pain and avoid a recurrence    Currently in Pain? No/denies   Pain Location Neck   Pain Orientation Left   Pain Type Acute pain   Pain Radiating Towards Lt neck into shoulder blade area - was in Rt shoulder blade    Pain Onset 1 to 4 weeks ago   Pain Frequency Intermittent   Aggravating Factors  lying wrong  at night; sleeping on side    Pain Relieving Factors heat, meds            [facility] PT Assessment - 10/13/15 0001    Assessment   Medical Diagnosis Lt cervical paraspinal spasms   Referring Provider Georgina Snell   Onset Date/Surgical Date 09/21/15   Hand Dominance Left   Next MD Visit 1/17   Precautions   Precautions None   Balance Screen   Has the patient fallen in the past 6 months No   Has the patient had a decrease in activity level because of a fear of falling?  No   Is the patient reluctant to leave their home because of a fear of falling?  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    Leisure household chores; sedentary; exercise 2-3x/wk zumba and bike    Observation/Other Assessments   Focus on Therapeutic Outcomes (FOTO)  32% limitation    Sensation   Additional Comments WFL's per pt report   Posture/Postural Control   Posture Comments head forward shoyudlers rounded and elevated; head of the humerus anterior in orientation; increased thoracic kyphosis; scapulae  abducted and rotated along the thoracic wall    AROM   Cervical Flexion 47   Cervical Extension 52  "tender"   Cervical - Right Side Bend 39   Cervical - Left Side Bend 40   Cervical - Right Rotation 73   Cervical - Left Rotation 68   Strength   Overall Strength Comments WFL's bilat UE's    Palpation   Palpation comment tight bilat cervical paraspinals; upper traps; pecs Lt > Rt                   [facility] Adult PT Treatment/Exercise - 10/13/15 0001    Self-Care   Self-Care --  myofacial ball release work    Neuro Re-ed    Neuro Re-ed Details  working on posture and alignment   Shoulder Exercises: Supine   Other Supine Exercises cervical retraction 10 sec x 5   Shoulder Exercises: Standing   Other Standing Exercises scap squeeze 10 sec x 10 with noodle     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 x3 stretches; 30 sec hold 3 reps   Other Shoulder Stretches lateral cervical flexion 10 sec x 3   Moist Heat Therapy   Number Minutes Moist Heat 15 Minutes   Moist Heat Location Cervical   Electrical Stimulation   Electrical Stimulation Location bilat cervical Lt pec/trap    Electrical Stimulation Action IFC   Electrical Stimulation Parameters to tolerance   Electrical Stimulation Goals Pain;Tone                PT Education - 10/13/15 0804    Education provided Yes   Education Details posture and alignment; HEP; TENS unit   Person(s) Educated Patient   Methods Explanation;Demonstration;Tactile cues;Verbal cues;Handout   Comprehension Verbalized understanding;Returned demonstration;Verbal cues required;Tactile cues required          PT Short Term Goals - 10/13/15 0813    PT SHORT TERM GOAL #1   Title Improve posture and alignment with pt to demonstrate good upright posture 11-10-15   Time 4   Period Weeks   Status New   PT SHORT TERM GOAL #2   Title Increase lateral cervical flexion by 5-8 degrees 11-10-15    Time 4   Period Weeks   Status New   PT SHORT TERM GOAL #3   Title Patient I In HEP 11-10-15   Time 4   Period Weeks   Status New   PT SHORT TERM GOAL #4   Title Improve FOTO to </=25% limitaion 11-10-15   Time 4   Period Weeks   Status New                  Plan - 10/13/15 0810    Clinical Impression Statement Patient presents with resolving Lt cervical muscular tightness and pain with symptoms including limited cervical ROM; muscular tightness; poor postrue and alignment; decreased functional activity level. Pt will benefit form PT to address problems identified.    Pt will benefit from skilled therapeutic intervention in order to improve on the following deficits Postural dysfunction;Improper body mechanics;Increased muscle spasms;Pain;Decreased range of motion;Decreased activity  tolerance   Rehab Potential Good   PT Frequency 1x / week   PT Duration 4 weeks   PT Treatment/Interventions Patient/family education;ADLs/Self Care Home Management;Neuromuscular re-education;Therapeutic activities;Therapeutic exercise;Moist Heat;Electrical Stimulation;Cryotherapy;Ultrasound;Dry needling;Manual techniques   PT Next Visit Plan review exercises; progress stretching; add pposterior shoulder girdle stretcthening   PT Home Exercise Plan postural correction; education re  work ergonomic changes; HEP    Consulted and Agree with Plan of Care Patient         Problem List Patient Active Problem List   Diagnosis Date Noted  . Cervical paraspinal muscle spasm 10/05/2015  . Iron deficiency 03/16/2015  . Other specified postprocedural states 02/25/2015  . Morbid obesity ([place]) 02/25/2015    Finnean Cerami Nilda Simmer PT, MPH  10/13/2015, 8:30 AM  [facility] [place], [place], [address] Phone: [phone]   Fax:  [phone]  Name: Lisa Tran MRN: [id number] Date of Birth: [birth year]

## 2015-10-20 ENCOUNTER — Ambulatory Visit (INDEPENDENT_AMBULATORY_CARE_PROVIDER_SITE_OTHER): Payer: BLUE CROSS/BLUE SHIELD | Admitting: Rehabilitative and Restorative Service Providers"

## 2015-10-20 ENCOUNTER — Encounter: Payer: Self-pay | Admitting: Rehabilitative and Restorative Service Providers"

## 2015-10-20 DIAGNOSIS — Z7409 Other reduced mobility: Secondary | ICD-10-CM | POA: Diagnosis not present

## 2015-10-20 DIAGNOSIS — R29898 Other symptoms and signs involving the musculoskeletal system: Secondary | ICD-10-CM

## 2015-10-20 DIAGNOSIS — R293 Abnormal posture: Secondary | ICD-10-CM

## 2015-10-20 DIAGNOSIS — M539 Dorsopathy, unspecified: Secondary | ICD-10-CM | POA: Diagnosis not present

## 2015-10-20 DIAGNOSIS — M542 Cervicalgia: Secondary | ICD-10-CM | POA: Diagnosis not present

## 2015-10-20 NOTE — Therapy (Signed)
[place], [place], [address] Phone: [phone]   Fax:  [phone]  Physical Therapy Treatment  Patient Details  Name: Lisa Tran MRN: [id number] Date of Birth: 01/17/1976 Referring Provider: Georgina Snell  Encounter Date: 10/20/2015      PT End of Session - 10/20/15 0711    Visit Number 2   Number of Visits 4   Date for PT Re-Evaluation 11/10/15   PT Start Time 0707   PT Stop Time 0755   PT Time Calculation (min) 48 min   Activity Tolerance Patient tolerated treatment well      Past Medical History  Diagnosis Date  . Asthma     Past Surgical History  Procedure Laterality Date  . Cosmetic surgery      There were no vitals filed for this visit.  Visit Diagnosis:  Cervical pain  Tightness of neck  Abnormal posture  Decreased mobility and endurance      Subjective Assessment - 10/20/15 0709    Subjective Patient reports that she continues to have tightness in the neck - Lt > Rt. She is more aware of the tightness in her neck and shoulders and is working to improve this - working on posture and work station. Her employer is going to work on Personal assistant for work station.    Currently in Pain? Yes   Pain Score 3    Pain Orientation Left   Pain Descriptors / Indicators Tightness   Pain Onset 1 to 4 weeks ago   Pain Frequency Intermittent            [facility] PT Assessment - 10/20/15 0001    AROM   Cervical Extension 55  tender/discomfort   Cervical - Right Side Bend 40   Cervical - Left Side Bend 44   Palpation   Palpation comment tight bilat cervical paraspinals; upper traps; pecs Lt > Rt                     [facility] Adult PT Treatment/Exercise - 10/20/15 0001    Shoulder Exercises: Supine   Other Supine Exercises cervical retraction 10 sec x 5   Shoulder Exercises: Standing   Extension Both;10 reps;Theraband   Theraband Level (Shoulder Extension) Level 2 (Red)    Row Both;10 reps;Theraband   Theraband Level (Shoulder Row) Level 2 (Red)   Retraction Both;10 reps;Theraband   Theraband Level (Shoulder Retraction) Level 1 (Yellow)  with noodle   Other Standing Exercises scap squeeze 10 sec x 10 with noodle    Shoulder Exercises: Stretch   Corner Stretch Limitations doorway x3 stretches; 30 sec hold 3 reps   Other Shoulder Stretches lateral cervical flexion 10 sec x 3   Moist Heat Therapy   Number Minutes Moist Heat 15 Minutes   Moist Heat Location Cervical   Electrical Stimulation   Electrical Stimulation Location bilat cervical Lt pec/trap    Electrical Stimulation Action IFC   Electrical Stimulation Parameters to tolerance   Electrical Stimulation Goals Pain;Tone   Manual Therapy   Joint Mobilization cervical PA  and UPA mobs    Soft tissue mobilization working through ant/lat/post cervical musculature    Passive ROM cervical                 PT Education - 10/20/15 0716    Education provided Yes   Education Details posture and alignment; corrected exercise; HEP;           PT Short  Term Goals - 10/20/15 0715    PT SHORT TERM GOAL #1   Title Improve posture and alignment with pt to demonstrate good upright posture 11-10-15   Time 4   Period Weeks   Status On-going   PT SHORT TERM GOAL #2   Title Increase lateral cervical flexion by 5-8 degrees 11-10-15    Time 4   Period Weeks   Status On-going   PT SHORT TERM GOAL #3   Title Patient I In HEP 11-10-15   Time 4   Period Weeks   Status On-going   PT SHORT TERM GOAL #4   Title Improve FOTO to </=25% limitaion 11-10-15   Time 4   Period Weeks   PT SHORT TERM GOAL #5   Status On-going                  Plan - 10/20/15 0711    Clinical Impression Statement Patient reports increaed awareness of poor posture and alignment and 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 Note improving posture and good techniques with exercises. Added posterior  shoulder girdle strengthening  without difficulty. Progressing toward stated goals of therapy.    Pt will benefit from skilled therapeutic intervention in order to improve on the following deficits Postural dysfunction;Improper body mechanics;Increased muscle spasms;Pain;Decreased range of motion;Decreased activity tolerance   Rehab Potential Good   PT Frequency 1x / week   PT Duration 4 weeks   PT Treatment/Interventions Patient/family education;ADLs/Self Care Home Management;Neuromuscular re-education;Therapeutic activities;Therapeutic exercise;Moist Heat;Electrical Stimulation;Cryotherapy;Ultrasound;Dry needling;Manual techniques   PT Next Visit Plan review exercises; progress stretching; continue posterior shoulder girdle stretcthening   PT Home Exercise Plan postural correction; education re work ergonomic changes; HEP    Consulted and Agree with Plan of Care Patient        Problem List Patient Active Problem List   Diagnosis Date Noted  . Cervical paraspinal muscle spasm 10/05/2015  . Iron deficiency 03/16/2015  . Other specified postprocedural states 02/25/2015  . Morbid obesity ([place]) 02/25/2015    Sayra Frisby Nilda Simmer PT, MPH  10/20/2015, 7:43 AM  [facility] [place], [place], [address] Phone: [phone]   Fax:  [phone]  Name: Lisa Tran MRN: [id number] Date of Birth: 1975/11/12

## 2015-10-20 NOTE — Patient Instructions (Signed)

## 2015-10-27 ENCOUNTER — Ambulatory Visit (INDEPENDENT_AMBULATORY_CARE_PROVIDER_SITE_OTHER): Payer: BLUE CROSS/BLUE SHIELD | Admitting: Physical Therapy

## 2015-10-27 DIAGNOSIS — Z7409 Other reduced mobility: Secondary | ICD-10-CM | POA: Diagnosis not present

## 2015-10-27 DIAGNOSIS — M542 Cervicalgia: Secondary | ICD-10-CM | POA: Diagnosis not present

## 2015-10-27 DIAGNOSIS — R293 Abnormal posture: Secondary | ICD-10-CM

## 2015-10-27 DIAGNOSIS — R29898 Other symptoms and signs involving the musculoskeletal system: Secondary | ICD-10-CM

## 2015-10-27 DIAGNOSIS — M539 Dorsopathy, unspecified: Secondary | ICD-10-CM

## 2015-10-27 NOTE — Therapy (Signed)
[place], [place], [address] Phone: [phone]   Fax:  [phone]  Physical Therapy Treatment  Patient Details  Name: Lisa Tran MRN: [id number] Date of Birth: 06/27/1976 Referring Provider: Dr. Georgina Snell   Encounter Date: 10/27/2015      PT End of Session - 10/27/15 0703    Visit Number 3   Number of Visits 4   Date for PT Re-Evaluation 11/10/15   PT Start Time 0701   PT Stop Time 0801   PT Time Calculation (min) 60 min   Activity Tolerance Patient tolerated treatment well;No increased pain      Past Medical History  Diagnosis Date  . Asthma     Past Surgical History  Procedure Laterality Date  . Cosmetic surgery      There were no vitals filed for this visit.  Visit Diagnosis:  Cervical pain  Tightness of neck  Abnormal posture  Decreased mobility and endurance      Subjective Assessment - 10/27/15 0704    Subjective Pt continues to have pain with laying on Lt side and with looking up (to reach into cabinet)   Currently in Pain? Yes   Pain Score 2    Pain Location Neck   Pain Orientation Left   Pain Descriptors / Indicators Nagging   Aggravating Factors  sleeping on side   Pain Relieving Factors heat, medication             [facility] PT Assessment - 10/27/15 0001    Assessment   Medical Diagnosis Lt cervical paraspinal spasms   Referring Provider Dr. Georgina Snell    Next MD Visit 11/05/15   AROM   Cervical Flexion 60   Cervical Extension 55   Cervical - Right Side Bend 50   Cervical - Left Side Bend 50   Cervical - Right Rotation 70   Cervical - Left Rotation 70          [facility] Adult PT Treatment/Exercise - 10/27/15 0001    Exercises   Exercises Neck   Shoulder Exercises: Standing   Horizontal ABduction Strengthening;Both;15 reps;Theraband   Theraband Level (Shoulder Horizontal ABduction) Level 2 (Red)   Extension Strengthening;Both;10 reps;Theraband   Theraband Level (Shoulder Extension) Level 2 (Red)   Row Strengthening;Both;10 reps;Theraband  2 sets    Theraband Level (Shoulder Row) Level 2 (Red)   Other Standing Exercises scap squeeze x 5 sec hold x 10 reps; axial extension x 5 sec hold x 10 reps   Other Standing Exercises sash with yellow band x 10 each side; external rotation with bilat -red band x 10 reps x 2 sets   Shoulder Exercises: Stretch   Other Shoulder Stretches horiz adduction with cervical flexion x 20 sec x 1 rep    Moist Heat Therapy   Number Minutes Moist Heat 15 Minutes   Moist Heat Location Cervical   Electrical Stimulation   Electrical Stimulation Location bilat cervical Lt pec/trap    Electrical Stimulation Action IFC   Electrical Stimulation Parameters to tolerance    Electrical Stimulation Goals Pain;Tone   Neck Exercises: Stretches   Upper Trapezius Stretch 4 reps   Upper Trapezius Stretch Limitations (2 reps with towel over the shoulder)    Levator Stretch 30 seconds;2 reps  each side    Other Neck Stretches elbows on knees, cervical diagonals x 5 reps each side; cervical flexion to neutral x 3 reps  PT Education - 10/27/15 0710    Education provided Yes   Education Details Pt encouraged to try towel roll in pillow to aid in improved sleeping.  HEP added horiz abd and sash exercise   Person(s) Educated Patient   Methods Explanation   Comprehension Verbalized understanding; returned demonstration           PT Short Term Goals - 10/27/15 0752    PT SHORT TERM GOAL #1   Title Improve posture and alignment with pt to demonstrate good upright posture 11-10-15   Time 4   Period Weeks   Status On-going   PT SHORT TERM GOAL #2   Title Increase lateral cervical flexion by 5-8 degrees 11-10-15    Time 4   Period Weeks   Status Achieved   PT SHORT TERM GOAL #3   Title Patient I In HEP 11-10-15   Time 4   Period Weeks   Status On-going   PT SHORT TERM GOAL #4   Title Improve FOTO to  </=25% limitaion 11-10-15   Time 4   Period Weeks   Status On-going                  Plan - 10/27/15 0749    Clinical Impression Statement Pt tolerated exercises well without increase in pain.  Pt reported decreased neck tension after upper trap/levator traps and further pain reduction with use of MHP and estim at end of session.  Patient has met STG #3 ands is progressing well towards remaining goals.    Pt will benefit from skilled therapeutic intervention in order to improve on the following deficits Postural dysfunction;Improper body mechanics;Increased muscle spasms;Pain;Decreased range of motion;Decreased activity tolerance   Rehab Potential Good   PT Frequency 1x / week   PT Duration 4 weeks   PT Treatment/Interventions Patient/family education;ADLs/Self Care Home Management;Neuromuscular re-education;Therapeutic activities;Therapeutic exercise;Moist Heat;Electrical Stimulation;Cryotherapy;Ultrasound;Dry needling;Manual techniques   PT Next Visit Plan Continue progressive stretching/strengthening of postural muscles.  Write MD note for upcoming appt (FOTO).    Consulted and Agree with Plan of Care Patient        Problem List Patient Active Problem List   Diagnosis Date Noted  . Cervical paraspinal muscle spasm 10/05/2015  . Iron deficiency 03/16/2015  . Other specified postprocedural states 02/25/2015  . Morbid obesity ([place]) 02/25/2015   Kerin Perna, PTA 10/27/2015 7:54 AM  [facility] [place], [place], [address] Phone: [phone]   Fax:  [phone]  Name: Lisa Tran MRN: [id number] Date of Birth: [birth year]

## 2015-10-27 NOTE — Patient Instructions (Signed)
(  Home) PNF: D2 Flexion - Unilateral    Opposite side toward anchor, right arm down, across body, thumb down, pull arm up and out, rotating to thumb up. Follow hand with head and eyes. Repeat __10__ times per set. Do __2__ sets per session. Do __3__ sessions per week. Yellow band, can progress to red band. (seat belt to hitch hiker pose)  Resisted Horizontal Abduction: Bilateral    Sit or stand, tubing in both hands, arms out in front. Keeping arms straight, pinch shoulder blades together and stretch arms out. Repeat _10___ times per set. Do __2__ sets per session. Do _1___ sessions per day.  [email or web address].exer.us/969    [facility] [place], [place] [address]  [phone] (office) [phone] (fax)

## 2015-11-03 ENCOUNTER — Ambulatory Visit (INDEPENDENT_AMBULATORY_CARE_PROVIDER_SITE_OTHER): Payer: BLUE CROSS/BLUE SHIELD | Admitting: Rehabilitative and Restorative Service Providers"

## 2015-11-03 DIAGNOSIS — M539 Dorsopathy, unspecified: Secondary | ICD-10-CM | POA: Diagnosis not present

## 2015-11-03 DIAGNOSIS — M542 Cervicalgia: Secondary | ICD-10-CM | POA: Diagnosis not present

## 2015-11-03 DIAGNOSIS — Z7409 Other reduced mobility: Secondary | ICD-10-CM

## 2015-11-03 DIAGNOSIS — R293 Abnormal posture: Secondary | ICD-10-CM

## 2015-11-03 DIAGNOSIS — R29898 Other symptoms and signs involving the musculoskeletal system: Secondary | ICD-10-CM

## 2015-11-03 NOTE — Patient Instructions (Signed)

## 2015-11-03 NOTE — Therapy (Signed)
[facility] [address] [place], [place], [address] Phone: [phone]   Fax:  [phone]  Physical Therapy Treatment  Patient Details  Name: Lisa Tran MRN: [id number] Date of Birth: 08/08/1976 Referring Provider: Corey  Encounter Date: 11/03/2015      PT End of Session - 11/03/15 0708    Visit Number 4   Number of Visits 4   Date for PT Re-Evaluation 11/10/15   PT Start Time 0705   PT Stop Time 0755   PT Time Calculation (min) 50 min   Activity Tolerance Patient tolerated treatment well      Past Medical History  Diagnosis Date  . Asthma     Past Surgical History  Procedure Laterality Date  . Cosmetic surgery      There were no vitals filed for this visit.  Visit Diagnosis:  Cervical pain  Tightness of neck  Abnormal posture  Decreased mobility and endurance      Subjective Assessment - 11/03/15 0709    Currently in Pain? Yes   Pain Score 1    Pain Location Neck   Pain Orientation Left   Pain Descriptors / Indicators Nagging   Pain Type Acute pain   Pain Onset More than a month ago   Pain Frequency Intermittent            [facility] PT Assessment - 11/03/15 0001    Assessment   Medical Diagnosis Lt cervical paraspinal spasms   Referring Provider Corey   Next MD Visit 11/05/15   Observation/Other Assessments   Focus on Therapeutic Outcomes (FOTO)  19% limitation    AROM   Cervical Flexion 60   Cervical Extension 61  nagging ache   Cervical - Right Side Bend 57   Cervical - Left Side Bend 54   Cervical - Right Rotation 74   Cervical - Left Rotation 78   Strength   Overall Strength Comments WFL's bilat UE's    Palpation   Palpation comment mild tight bilat cervical paraspinals; upper traps; pecs Lt > Rt                     [facility] Adult PT Treatment/Exercise - 11/03/15 0001    Shoulder Exercises: Supine   Other Supine Exercises cervical retraction 10 sec x 5    Shoulder Exercises: Standing   Horizontal ABduction Strengthening;Both;15 reps;Theraband   Theraband Level (Shoulder Horizontal ABduction) Level 2 (Red)   Extension Strengthening;Both;10 reps;Theraband   Theraband Level (Shoulder Extension) Level 2 (Red)   Row Strengthening;Both;10 reps;Theraband  2 sets    Theraband Level (Shoulder Row) Level 2 (Red)   Retraction Both;10 reps;Theraband   Theraband Level (Shoulder Retraction) Level 1 (Yellow)   Other Standing Exercises scap squeeze x 5 sec hold x 10 reps; axial extension x 5 sec hold x 10 reps   Shoulder Exercises: Stretch   Corner Stretch Limitations doorway x3 stretches; 30 sec hold 3 reps   Moist Heat Therapy   Number Minutes Moist Heat 15 Minutes   Moist Heat Location Cervical   Electrical Stimulation   Electrical Stimulation Location bilat cervical Lt pec/trap    Electrical Stimulation Action IFC   Electrical Stimulation Parameters to tolerance   Electrical Stimulation Goals Pain;Tone   Manual Therapy   Joint Mobilization cervical PA  and UPA mobs    Soft tissue mobilization working through ant/lat/post cervical musculature    Passive ROM cervical                   PT Education - 11/03/15 0721    Education provided Yes   Education Details HEP; TENS unit: cont exercise   Person(s) Educated Patient   Methods Explanation   Comprehension Verbalized understanding          PT Short Term Goals - 11/03/15 0732    PT SHORT TERM GOAL #1   Title Improve posture and alignment with pt to demonstrate good upright posture 11-10-15   Time 4   Period Weeks   Status Achieved   PT SHORT TERM GOAL #2   Title Increase lateral cervical flexion by 5-8 degrees 11-10-15    Time 4   Period Weeks   Status Achieved   PT SHORT TERM GOAL #3   Title Patient I In HEP 11-10-15   Period Weeks   Status Achieved   PT SHORT TERM GOAL #4   Title Improve FOTO to </=25% limitaion 11-10-15   Time 4   Period Weeks   Status Achieved                   Plan - 11/03/15 0731    Clinical Impression Statement Excellent progress with good improvement in symptoms. Pt is independent in HEP. Pt will continue with consistent, regular exercises and call with any questioins or problems. Goals of therapy have been accomplished.    Pt will benefit from skilled therapeutic intervention in order to improve on the following deficits Postural dysfunction;Improper body mechanics;Increased muscle spasms;Pain;Decreased range of motion;Decreased activity tolerance   Rehab Potential Good   PT Frequency 1x / week   PT Duration 4 weeks   PT Treatment/Interventions Patient/family education;ADLs/Self Care Home Management;Neuromuscular re-education;Therapeutic activities;Therapeutic exercise;Moist Heat;Electrical Stimulation;Cryotherapy;Ultrasound;Dry needling;Manual techniques   PT Next Visit Plan D/C to I HEP. Pt will persue TENS unit for home and continue with I HEP   PT Home Exercise Plan postural correction; education re work ergonomic changes; HEP    Consulted and Agree with Plan of Care Patient        Problem List Patient Active Problem List   Diagnosis Date Noted  . Cervical paraspinal muscle spasm 10/05/2015  . Iron deficiency 03/16/2015  . Other specified postprocedural states 02/25/2015  . Morbid obesity (HCC) 02/25/2015    Celyn P Holt PT, MPH  11/03/2015, 7:40 AM  [facility] [address] [place], [place], [address] Phone: [phone]   Fax:  [phone]  Name: Lisa Tran MRN: [id number] Date of Birth: 09/17/1976  PHYSICAL THERAPY DISCHARGE SUMMARY  Visits from Start of Care: 4  Current functional level related to goals / functional outcomes: I in HEP returned to all normal functional activities   Remaining deficits: Some nagging ache if she sleeps the wrong way   Education / Equipment: HEP; TB Plan: Patient agrees to discharge.  Patient  goals were met. Patient is being discharged due to meeting the stated rehab goals.  ?????    Celyn P. Holt PT, MPH 11/03/2015 7:42 AM     

## 2015-11-05 ENCOUNTER — Ambulatory Visit (INDEPENDENT_AMBULATORY_CARE_PROVIDER_SITE_OTHER): Payer: BLUE CROSS/BLUE SHIELD | Admitting: Family Medicine

## 2015-11-05 DIAGNOSIS — M6249 Contracture of muscle, multiple sites: Secondary | ICD-10-CM

## 2015-11-05 NOTE — Progress Notes (Signed)
No show

## 2016-01-05 ENCOUNTER — Other Ambulatory Visit (HOSPITAL_COMMUNITY): Payer: Self-pay | Admitting: Obstetrics & Gynecology

## 2016-01-05 DIAGNOSIS — Z1231 Encounter for screening mammogram for malignant neoplasm of breast: Secondary | ICD-10-CM

## 2016-01-19 ENCOUNTER — Ambulatory Visit (INDEPENDENT_AMBULATORY_CARE_PROVIDER_SITE_OTHER): Payer: BLUE CROSS/BLUE SHIELD

## 2016-01-19 ENCOUNTER — Ambulatory Visit (INDEPENDENT_AMBULATORY_CARE_PROVIDER_SITE_OTHER): Payer: BLUE CROSS/BLUE SHIELD | Admitting: Obstetrics & Gynecology

## 2016-01-19 ENCOUNTER — Encounter: Payer: Self-pay | Admitting: Obstetrics & Gynecology

## 2016-01-19 VITALS — BP 123/75 | HR 91 | Resp 16 | Ht 66.0 in | Wt 304.0 lb

## 2016-01-19 DIAGNOSIS — Z01419 Encounter for gynecological examination (general) (routine) without abnormal findings: Secondary | ICD-10-CM

## 2016-01-19 DIAGNOSIS — Z1231 Encounter for screening mammogram for malignant neoplasm of breast: Secondary | ICD-10-CM | POA: Diagnosis not present

## 2016-01-19 DIAGNOSIS — Z124 Encounter for screening for malignant neoplasm of cervix: Secondary | ICD-10-CM

## 2016-01-19 DIAGNOSIS — B373 Candidiasis of vulva and vagina: Secondary | ICD-10-CM | POA: Diagnosis not present

## 2016-01-19 DIAGNOSIS — B3731 Acute candidiasis of vulva and vagina: Secondary | ICD-10-CM

## 2016-01-19 DIAGNOSIS — Z1151 Encounter for screening for human papillomavirus (HPV): Secondary | ICD-10-CM | POA: Diagnosis not present

## 2016-01-19 DIAGNOSIS — R928 Other abnormal and inconclusive findings on diagnostic imaging of breast: Secondary | ICD-10-CM | POA: Diagnosis not present

## 2016-01-19 NOTE — Addendum Note (Signed)
Addended by: Asencion Islam on: 01/19/2016 10:16 AM   Modules accepted: Orders

## 2016-01-19 NOTE — Progress Notes (Signed)
Subjective:    Shakkia Kaffenberger is a 40 y.o. G66 female who presents for an annual exam. The patient has no complaints today except that she thinks she may have a yeast infection. The patient is sexually active. GYN screening history: last pap: was normal. The patient wears seatbelts: yes. The patient participates in regular exercise: yes. Has the patient ever been transfused or tattooed?: no. The patient reports that there is not domestic violence in her life.   Menstrual History: OB History    Gravida Para Term Preterm AB TAB SAB Ectopic Multiple Living   0 0 0 0 0 0 0 0 0 0       Menarche age: 5 Patient's last menstrual period was 01/02/2016.    The following portions of the patient's history were reviewed and updated as appropriate: allergies, current medications, past family history, past medical history, past social history, past surgical history and problem list.  Review of Systems Pertinent items are noted in HPI.  Married for 9 years, some dryness with sex. Works at [employer]. Mammogram today.   Objective:    BP 123/75 mmHg  Pulse 91  Resp 16  Ht 5\' 6"  (1.676 m)  Wt 304 lb (137.893 kg)  BMI 49.09 kg/m2  LMP 01/02/2016  General Appearance:    Alert, cooperative, no distress, appears stated age  Head:    Normocephalic, without obvious abnormality, atraumatic  Eyes:    PERRL, conjunctiva/corneas clear, EOM's intact, fundi    benign, both eyes  Ears:    Normal TM's and external ear canals, both ears  Nose:   Nares normal, septum midline, mucosa normal, no drainage    or sinus tenderness  Throat:   Lips, mucosa, and tongue normal; teeth and gums normal  Neck:   Supple, symmetrical, trachea midline, no adenopathy;    thyroid:  no enlargement/tenderness/nodules; no carotid   bruit or JVD  Back:     Symmetric, no curvature, ROM normal, no CVA tenderness  Lungs:     Clear to auscultation bilaterally, respirations unlabored  Chest Wall:    No tenderness or deformity   Heart:    Regular rate and rhythm, S1 and S2 normal, no murmur, rub   or gallop  Breast Exam:    No tenderness, masses, or nipple abnormality  Abdomen:     Soft, non-tender, bowel sounds active all four quadrants,    no masses, no organomegaly  Genitalia:    Normal female without lesion, discharge or tenderness, NSSmid plane, NT,  Minimal mobiltiy, normal adnexal exam     Extremities:   Extremities normal, atraumatic, no cyanosis or edema  Pulses:   2+ and symmetric all extremities  Skin:   Skin color, texture, turgor normal, no rashes or lesions  Lymph nodes:   Cervical, supraclavicular, and axillary nodes normal  Neurologic:   CNII-XII intact, normal strength, sensation and reflexes    throughout  .    Assessment:    Healthy female exam.    Plan:     Thin prep Pap smear. with cotesting Fasting labs at her convenience Refer to [facility] for weight loss

## 2016-01-20 ENCOUNTER — Other Ambulatory Visit: Payer: Self-pay | Admitting: *Deleted

## 2016-01-20 ENCOUNTER — Telehealth: Payer: Self-pay | Admitting: *Deleted

## 2016-01-20 DIAGNOSIS — Z3041 Encounter for surveillance of contraceptive pills: Secondary | ICD-10-CM

## 2016-01-20 LAB — WET PREP FOR TRICH, YEAST, CLUE
Trich, Wet Prep: NONE SEEN
Yeast Wet Prep HPF POC: NONE SEEN

## 2016-01-20 LAB — CYTOLOGY - PAP

## 2016-01-20 MED ORDER — METRONIDAZOLE 500 MG PO TABS: 500.0000 mg | ORAL_TABLET | Freq: Two times a day (BID) | ORAL | Status: DC

## 2016-01-20 MED ORDER — NORETHINDRONE-ETH ESTRADIOL 0.4-35 MG-MCG PO TABS: ORAL_TABLET | ORAL | Status: DC

## 2016-01-20 NOTE — Telephone Encounter (Signed)
Pt notified of positive BV and RX sent to [facility] per Dr Hulan Fray

## 2016-01-21 ENCOUNTER — Other Ambulatory Visit (HOSPITAL_COMMUNITY): Payer: Self-pay | Admitting: Obstetrics & Gynecology

## 2016-01-21 DIAGNOSIS — R928 Other abnormal and inconclusive findings on diagnostic imaging of breast: Secondary | ICD-10-CM

## 2016-01-24 DIAGNOSIS — Z01419 Encounter for gynecological examination (general) (routine) without abnormal findings: Secondary | ICD-10-CM | POA: Diagnosis not present

## 2016-01-25 ENCOUNTER — Ambulatory Visit (INDEPENDENT_AMBULATORY_CARE_PROVIDER_SITE_OTHER): Payer: BLUE CROSS/BLUE SHIELD | Admitting: Osteopathic Medicine

## 2016-01-25 ENCOUNTER — Encounter: Payer: Self-pay | Admitting: Osteopathic Medicine

## 2016-01-25 ENCOUNTER — Other Ambulatory Visit: Payer: BLUE CROSS/BLUE SHIELD

## 2016-01-25 DIAGNOSIS — L309 Dermatitis, unspecified: Secondary | ICD-10-CM | POA: Diagnosis not present

## 2016-01-25 LAB — CBC
HCT: 32.6 % — ABNORMAL LOW (ref 35.0–45.0)
Hemoglobin: 10.2 g/dL — ABNORMAL LOW (ref 11.7–15.5)
MCH: 23.6 pg — ABNORMAL LOW (ref 27.0–33.0)
MCHC: 31.3 g/dL — ABNORMAL LOW (ref 32.0–36.0)
MCV: 75.5 fL — ABNORMAL LOW (ref 80.0–100.0)
MPV: 8.4 fL (ref 7.5–12.5)
Platelets: 482 10*3/uL — ABNORMAL HIGH (ref 140–400)
RBC: 4.32 MIL/uL (ref 3.80–5.10)
RDW: 16.6 % — ABNORMAL HIGH (ref 11.0–15.0)
WBC: 4.2 10*3/uL (ref 3.8–10.8)

## 2016-01-25 LAB — COMPREHENSIVE METABOLIC PANEL
ALT: 18 U/L (ref 6–29)
AST: 29 U/L (ref 10–30)
Albumin: 3.8 g/dL (ref 3.6–5.1)
Alkaline Phosphatase: 49 U/L (ref 33–115)
BUN: 9 mg/dL (ref 7–25)
CO2: 25 mmol/L (ref 20–31)
Calcium: 9.1 mg/dL (ref 8.6–10.2)
Chloride: 102 mmol/L (ref 98–110)
Creat: 0.79 mg/dL (ref 0.50–1.10)
Glucose, Bld: 88 mg/dL (ref 65–99)
Potassium: 4.5 mmol/L (ref 3.5–5.3)
Sodium: 137 mmol/L (ref 135–146)
Total Bilirubin: 0.5 mg/dL (ref 0.2–1.2)
Total Protein: 6.8 g/dL (ref 6.1–8.1)

## 2016-01-25 LAB — LIPID PANEL
Cholesterol: 167 mg/dL (ref 125–200)
HDL: 108 mg/dL (ref >=46)
LDL Cholesterol: 40 mg/dL (ref <130)
Total CHOL/HDL Ratio: 1.5 Ratio (ref <=5.0)
Triglycerides: 95 mg/dL (ref <150)
VLDL: 19 mg/dL (ref <30)

## 2016-01-25 LAB — TSH: TSH: 2.3 mIU/L

## 2016-01-25 MED ORDER — HYDROCORTISONE 2.5 % EX OINT: TOPICAL_OINTMENT | CUTANEOUS | Status: DC

## 2016-01-25 NOTE — Patient Instructions (Signed)
Look up some more information about the following medications:  Qsymia (Phentermine and Topiramate)  Contrave (Bupropion and Naltrexone)  Saxenda (injectable Liraglutide)     Calorie Counting for Weight Loss Calories are energy you get from the things you eat and drink. Your body uses this energy to keep you going throughout the day. The number of calories you eat affects your weight. When you eat more calories than your body needs, your body stores the extra calories as fat. When you eat fewer calories than your body needs, your body burns fat to get the energy it needs. Calorie counting means keeping track of how many calories you eat and drink each day. If you make sure to eat fewer calories than your body needs, you should lose weight. In order for calorie counting to work, you will need to eat the number of calories that are right for you in a day to lose a healthy amount of weight per week. A healthy amount of weight to lose per week is usually 1-2 lb (0.5-0.9 kg). A dietitian can determine how many calories you need in a day and give you suggestions on how to reach your calorie goal.  WHAT DO I NEED TO KNOW ABOUT CALORIE COUNTING? In order to meet your daily calorie goal, you will need to:  Find out how many calories are in each food you would like to eat. Try to do this before you eat.  Decide how much of the food you can eat.  Write down what you ate and how many calories it had. Doing this is called keeping a food log. WHERE DO I FIND CALORIE INFORMATION? The number of calories in a food can be found on a Nutrition Facts label. Note that all the information on a label is based on a specific serving of the food. If a food does not have a Nutrition Facts label, try to look up the calories online or ask your dietitian for help. HOW DO I DECIDE HOW MUCH TO EAT? To decide how much of the food you can eat, you will need to consider both the number of calories in one serving and the size of  one serving. This information can be found on the Nutrition Facts label. If a food does not have a Nutrition Facts label, look up the information online or ask your dietitian for help. Remember that calories are listed per serving. If you choose to have more than one serving of a food, you will have to multiply the calories per serving by the amount of servings you plan to eat. For example, the label on a package of bread might say that a serving size is 1 slice and that there are 90 calories in a serving. If you eat 1 slice, you will have eaten 90 calories. If you eat 2 slices, you will have eaten 180 calories. HOW DO I KEEP A FOOD LOG? After each meal, record the following information in your food log:  What you ate.  How much of it you ate.  How many calories it had.  Then, add up your calories. Keep your food log near you, such as in a small notebook in your pocket. Another option is to use a mobile app or website. Some programs will calculate calories for you and show you how many calories you have left each time you add an item to the log. WHAT ARE SOME CALORIE COUNTING TIPS?  Use your calories on foods and drinks that will  fill you up and not leave you hungry. Some examples of this include foods like nuts and nut butters, vegetables, lean proteins, and high-fiber foods (more than 5 g fiber per serving).  Eat nutritious foods and avoid empty calories. Empty calories are calories you get from foods or beverages that do not have many nutrients, such as candy and soda. It is better to have a nutritious high-calorie food (such as an avocado) than a food with few nutrients (such as a bag of chips).  Know how many calories are in the foods you eat most often. This way, you do not have to look up how many calories they have each time you eat them.  Look out for foods that may seem like low-calorie foods but are really high-calorie foods, such as baked goods, soda, and fat-free candy.  Pay  attention to calories in drinks. Drinks such as sodas, specialty coffee drinks, alcohol, and juices have a lot of calories yet do not fill you up. Choose low-calorie drinks like water and diet drinks.  Focus your calorie counting efforts on higher calorie items. Logging the calories in a garden salad that contains only vegetables is less important than calculating the calories in a milk shake.  Find a way of tracking calories that works for you. Get creative. Most people who are successful find ways to keep track of how much they eat in a day, even if they do not count every calorie. WHAT ARE SOME PORTION CONTROL TIPS?  Know how many calories are in a serving. This will help you know how many servings of a certain food you can have.  Use a measuring cup to measure serving sizes. This is helpful when you start out. With time, you will be able to estimate serving sizes for some foods.  Take some time to put servings of different foods on your favorite plates, bowls, and cups so you know what a serving looks like.  Try not to eat straight from a bag or box. Doing this can lead to overeating. Put the amount you would like to eat in a cup or on a plate to make sure you are eating the right portion.  Use smaller plates, glasses, and bowls to prevent overeating. This is a quick and easy way to practice portion control. If your plate is smaller, less food can fit on it.  Try not to multitask while eating, such as watching TV or using your computer. If it is time to eat, sit down at a table and enjoy your food. Doing this will help you to start recognizing when you are full. It will also make you more aware of what and how much you are eating. HOW CAN I CALORIE COUNT WHEN EATING OUT?  Ask for smaller portion sizes or child-sized portions.  Consider sharing an entree and sides instead of getting your own entree.  If you get your own entree, eat only half. Ask for a box at the beginning of your meal and  put the rest of your entree in it so you are not tempted to eat it.  Look for the calories on the menu. If calories are listed, choose the lower calorie options.  Choose dishes that include vegetables, fruits, whole grains, low-fat dairy products, and lean protein. Focusing on smart food choices from each of the 5 food groups can help you stay on track at restaurants.  Choose items that are boiled, broiled, grilled, or steamed.  Choose water, milk, unsweetened iced  tea, or other drinks without added sugars. If you want an alcoholic beverage, choose a lower calorie option. For example, a regular margarita can have up to 700 calories and a glass of wine has around 150.  Stay away from items that are buttered, battered, fried, or served with cream sauce. Items labeled "crispy" are usually fried, unless stated otherwise.  Ask for dressings, sauces, and syrups on the side. These are usually very high in calories, so do not eat much of them.  Watch out for salads. Many people think salads are a healthy option, but this is often not the case. Many salads come with bacon, fried chicken, lots of cheese, fried chips, and dressing. All of these items have a lot of calories. If you want a salad, choose a garden salad and ask for grilled meats or steak. Ask for the dressing on the side, or ask for olive oil and vinegar or lemon to use as dressing.  Estimate how many servings of a food you are given. For example, a serving of cooked rice is  cup or about the size of half a tennis ball or one cupcake wrapper. Knowing serving sizes will help you be aware of how much food you are eating at restaurants. The list below tells you how big or small some common portion sizes are based on everyday objects.  1 oz--4 stacked dice.  3 oz--1 deck of cards.  1 tsp--1 dice.  1 Tbsp-- a Ping-Pong ball.  2 Tbsp--1 Ping-Pong ball.   cup--1 tennis ball or 1 cupcake wrapper.  1 cup--1 baseball.   This information is  not intended to replace advice given to you by your health care provider. Make sure you discuss any questions you have with your health care provider.   Document Released: 09/25/2005 Document Revised: 10/16/2014 Document Reviewed: 07/31/2013 Elsevier Interactive Patient Education [employer].

## 2016-01-25 NOTE — Progress Notes (Signed)
HPI: Lisa Tran is a 40 y.o. female who presents to [facility] today for chief complaint of:  Chief Complaint  Patient presents with  . Establish Care    Discuss weight and Eczema      OBESITY  . Duration: most of her life has been heavy . Context:Gastric Bypass 2003 or 2004 at about 400 lbs, got down to 200 lbs but over time has been gaining again. Would like to get back down to 200 lbs as goal.  . Modifying factors: Medication used in the past, not sure which. Previously seen by bariatrics but didn't really get along with those physicians/office so only went twice. Using elliptical 3x/week for about 30 minutes at a time with continuous resistance, getting some weight training in. Eating: "I ain't eating bad but ain't eating right" - sugary foods are a problem, stress eating is a problem and also overeats when she is happy. Had counted calories in the past - was using MyFitnessPal app but trouble sticking with this.   ECZEMA - needs refill of prn steroid cream  Past medical, social and family history reviewed: Past Medical History  Diagnosis Date  . Asthma   . Allergy    Past Surgical History  Procedure Laterality Date  . Cosmetic surgery    . Knee surgery  2014   Social History  Substance Use Topics  . Smoking status: Never Smoker   . Smokeless tobacco: Never Used  . Alcohol Use: Yes   Family History  Problem Relation Age of Onset  . Diabetes Maternal Aunt   . Diabetes Maternal Grandfather   . Cancer Maternal Grandfather     prostate  . Hypertension Maternal Aunt     Current Outpatient Prescriptions  Medication Sig Dispense Refill  . albuterol (PROVENTIL HFA;VENTOLIN HFA) 108 (90 BASE) MCG/ACT inhaler Inhale 2 puffs into the lungs every 6 (six) hours as needed. For shortness of breath or wheezing    . cetirizine (ZYRTEC) 10 MG tablet Take 10 mg by mouth daily.    . Cholecalciferol (VITAMIN D3) 3000 UNITS TABS Take 1 tablet by  mouth daily.    . Multiple Vitamin (MULTIVITAMIN WITH MINERALS) TABS Take 1 tablet by mouth daily.    . norethindrone-ethinyl estradiol (BALZIVA) 0.4-35 MG-MCG tablet TAKE 1 TABLET DAILY 84 tablet 6  . Olopatadine HCl (PAZEO) 0.7 % SOLN INSTILL 1 DROP INTO BOTH EYES ONCE A DAY FOR 30 DAYS     No current facility-administered medications for this visit.   Allergies  Allergen Reactions  . Shellfish Allergy Anaphylaxis      Review of Systems: CONSTITUTIONAL:  No  fever, no chills, (+)unintentional weight changes HEAD/EYES/EARS/NOSE/THROAT: No  headache, no vision change, no hearing change, No  sore throat, No  sinus pressure CARDIAC: No  chest pain, No  pressure, No palpitations, No  orthopnea RESPIRATORY: No  cough, No  shortness of breath/wheeze GASTROINTESTINAL: No  nausea, No  vomiting, No  abdominal pain, No  blood in stool, No  diarrhea, No  constipation  MUSCULOSKELETAL: No  myalgia/arthralgia GENITOURINARY: No  incontinence, No  abnormal genital bleeding/discharge SKIN: No  rash/wounds/concerning lesions HEM/ONC: No  easy bruising/bleeding, No  abnormal lymph node ENDOCRINE: No polyuria/polydipsia/polyphagia, No  heat/cold intolerance  NEUROLOGIC: No  weakness, No  dizziness, No  slurred speech PSYCHIATRIC: No  concerns with depression, (+) concerns with anxiety, No sleep problems  Exam:  BP 129/86 mmHg  Pulse 80  Ht 5\' 6"  (1.676 m)  Wt  306 lb (138.801 kg)  BMI 49.41 kg/m2  LMP 01/02/2016 Constitutional: VS see above. General Appearance: alert, well-developed, well-nourished, NAD Eyes: Normal lids and conjunctive, non-icteric sclera,  Ears, Nose, Mouth, Throat: MMM, Normal external inspection ears/nares.  Neck: No masses, trachea midline.  Respiratory: Normal respiratory effort. no wheeze, no rhonchi, no rales Cardiovascular: S1/S2 normal, no murmur, no rub/gallop auscultated. RRR. No lower extremity edema. Psychiatric: Normal judgment/insight. Normal mood and affect.  Oriented x3.    No results found for this or any previous visit (from the past 72 hour(s)).    ASSESSMENT/PLAN: Refer nurition therapy for meal plan but overall cut back on processed foods, red meat, sugary carbohydrates and fatty dairy foods. Healthy snacks between meals and portion control advised. For CV exercise try interval training, for weights advised on alternate upper/lower body machines rather than resting between sets of just upper or lower body. Advised talk to trainer if possible. Reviewed intensity/duration/frequency of exercise - increase one of these components at a time but plan to be continually striving for improvement/advancement as long as nonpainful and no CP/SOB. Advised low-impact exercises - continue these. Will think about weight loss medications - research these and and about insurance coverage and will come back in 6 weeks for weight check and consider meds at that time. Getting labs done today as ordered by her GYN.   Morbid obesity, unspecified obesity type ([place]) - Plan: Amb ref to Medical Nutrition Therapy-MNT  Eczema - Plan: hydrocortisone 2.5 % ointment   Return in about 6 weeks (around 03/07/2016), or sooner if needed, for WEIGHT RECHECK  .  Total time spent 45 minutes, greater than 50% of the visit was face-to-face counseling and coordinating care for diagnosis of obesity.

## 2016-01-26 LAB — VITAMIN D 25 HYDROXY (VIT D DEFICIENCY, FRACTURES): Vit D, 25-Hydroxy: 29 ng/mL — ABNORMAL LOW (ref 30–100)

## 2016-01-27 ENCOUNTER — Telehealth: Payer: Self-pay | Admitting: *Deleted

## 2016-01-27 DIAGNOSIS — E559 Vitamin D deficiency, unspecified: Secondary | ICD-10-CM

## 2016-01-27 MED ORDER — VITAMIN D (ERGOCALCIFEROL) 1.25 MG (50000 UNIT) PO CAPS: 50000.0000 [IU] | ORAL_CAPSULE | ORAL | Status: DC

## 2016-01-27 NOTE — Telephone Encounter (Signed)
-----   Message from Emily Filbert, MD sent at 01/26/2016  4:06 PM EDT ----- Please prescribe her Vitamin D 50,000 units po weekly for 8 weeks and then recheck Vitamin D level. Thanks

## 2016-01-27 NOTE — Telephone Encounter (Signed)
Pt notified of Vitamin D deficiency and per Dr Rea College for Vitamin D 50K sent to her pharmacy and will recheck levels after 8 weeks.

## 2016-03-07 ENCOUNTER — Ambulatory Visit: Payer: BLUE CROSS/BLUE SHIELD | Admitting: Osteopathic Medicine

## 2016-03-24 ENCOUNTER — Ambulatory Visit: Payer: BLUE CROSS/BLUE SHIELD | Admitting: Osteopathic Medicine

## 2016-05-02 ENCOUNTER — Ambulatory Visit (INDEPENDENT_AMBULATORY_CARE_PROVIDER_SITE_OTHER): Payer: BLUE CROSS/BLUE SHIELD | Admitting: Osteopathic Medicine

## 2016-05-02 ENCOUNTER — Encounter: Payer: Self-pay | Admitting: Osteopathic Medicine

## 2016-05-02 VITALS — BP 135/84 | HR 80 | Ht 65.5 in | Wt 306.0 lb

## 2016-05-02 DIAGNOSIS — J302 Other seasonal allergic rhinitis: Secondary | ICD-10-CM | POA: Diagnosis not present

## 2016-05-02 MED ORDER — AZELASTINE-FLUTICASONE 137-50 MCG/ACT NA SUSP: 2.0000 | Freq: Two times a day (BID) | NASAL | 3 refills | Status: DC

## 2016-05-02 MED ORDER — MONTELUKAST SODIUM 10 MG PO TABS: 10.0000 mg | ORAL_TABLET | Freq: Every day | ORAL | 3 refills | Status: DC

## 2016-05-02 NOTE — Patient Instructions (Addendum)
Ok to continue Zyrtec or Zyrtec-D plus Flonase/Fluticason nasal spray. If not helping, stop the Flonase and fill the Rx for the other nasal spray. If that's not helping, fill the Rx for Singulair.

## 2016-05-02 NOTE — Progress Notes (Signed)
HPI: Lisa Tran is a 40 y.o. Not Hispanic or Latino female  who presents to [facility] today, 05/02/16,  for chief complaint of:  Chief Complaint  Patient presents with  . Allergies     . Quality: itchy eyes, nasal congestion . Duration: 3 - 4 days has been pretty bad . Modifying factors: on meds as below - Zytrec, eye drops from eye doctor. Nasal spray OTC years ago.  . Assoc signs/symptoms: slight bloody nose this morning   Past medical, surgical, social and family history reviewed: Past Medical History:  Diagnosis Date  . Allergy   . Asthma    Past Surgical History:  Procedure Laterality Date  . COSMETIC SURGERY    . GASTRIC BYPASS  2003  . KNEE SURGERY  2014   Social History  Substance Use Topics  . Smoking status: Never Smoker  . Smokeless tobacco: Never Used  . Alcohol use Yes   Family History  Problem Relation Age of Onset  . Diabetes Maternal Aunt   . Diabetes Maternal Grandfather   . Cancer Maternal Grandfather     prostate  . Hypertension Maternal Aunt      Current medication list and allergy/intolerance information reviewed:   Current Outpatient Prescriptions  Medication Sig Dispense Refill  . albuterol (PROVENTIL HFA;VENTOLIN HFA) 108 (90 BASE) MCG/ACT inhaler Inhale 2 puffs into the lungs every 6 (six) hours as needed. For shortness of breath or wheezing    . cetirizine (ZYRTEC) 10 MG tablet Take 10 mg by mouth daily.    . hydrocortisone 2.5 % ointment Apply to affected area BID. 30 g 3  . Multiple Vitamin (MULTIVITAMIN WITH MINERALS) TABS Take 1 tablet by mouth daily.    . norethindrone-ethinyl estradiol (BALZIVA) 0.4-35 MG-MCG tablet TAKE 1 TABLET DAILY 84 tablet 6  . Olopatadine HCl (PAZEO) 0.7 % SOLN INSTILL 1 DROP INTO BOTH EYES ONCE A DAY FOR 30 DAYS    . Vitamin D, Ergocalciferol, (DRISDOL) 50000 units CAPS capsule Take 1 capsule (50,000 Units total) by mouth every 7 (seven) days. 8 capsule 0   No  current facility-administered medications for this visit.    Allergies  Allergen Reactions  . Shellfish Allergy Anaphylaxis      Review of Systems:  Constitutional:  No  fever, no chills, No recent illness,  HEENT: No  headache, no vision change, no hearing change, No sore throat, (+) sinus pressure, (+) epistaxi  Cardiac: No  chest pain, No  pressure  Respiratory:  No  shortness of breath. No  Cough   Exam:  BP 135/84   Pulse 80   Ht 5' 5.5" (1.664 m)   Wt (!) 306 lb (138.8 kg)   BMI 50.15 kg/m   Constitutional: VS see above. General Appearance: alert, well-developed, well-nourished, NAD  Eyes: Normal lids and conjunctive, non-icteric sclera  Ears, Nose, Mouth, Throat: MMM, Normal external inspection ears/nares/mouth/lips/gums. Pharynx/tonsils no erythema, no exudate. Nasal mucosa pale, raw area on R side.   Neck: No masses, trachea midline. No thyroid enlargement. No tenderness/mass appreciated. No lymphadenopathy  Respiratory: Normal respiratory effort. no wheeze, no rhonchi, no rales  Cardiovascular: S1/S2 normal, no murmur, no rub/gallop auscultated. RRR.    ASSESSMENT/PLAN:   See patient instructions, add Flonase to Zyrtec or Zyrtec-D that she is already taking.   If that is not helping, switch the nasal spray to the azelastine/fluticasone, alternatively can add Singulair.   Seasonal allergies - Plan: Azelastine-Fluticasone 137-50 MCG/ACT SUSP, montelukast (SINGULAIR) 10  MG tablet     Visit summary with medication list and pertinent instructions was printed for patient to review. All questions at time of visit were answered - patient instructed to contact office with any additional concerns. ER/RTC precautions were reviewed with the patient. Follow-up plan: Return if symptoms worsen or fail to improve.

## 2016-06-30 ENCOUNTER — Telehealth: Payer: Self-pay | Admitting: *Deleted

## 2016-06-30 NOTE — Telephone Encounter (Signed)
Pa initiated on Dymista through covermymeds Key: Dollene [place]

## 2016-07-18 NOTE — Telephone Encounter (Signed)
Approved through 07/18/2017 case # [id number]. Message left on patient's vm

## 2016-11-06 DIAGNOSIS — L2089 Other atopic dermatitis: Secondary | ICD-10-CM | POA: Diagnosis not present

## 2016-11-06 DIAGNOSIS — L700 Acne vulgaris: Secondary | ICD-10-CM | POA: Diagnosis not present

## 2017-01-05 DIAGNOSIS — L2089 Other atopic dermatitis: Secondary | ICD-10-CM | POA: Diagnosis not present

## 2017-01-05 DIAGNOSIS — L700 Acne vulgaris: Secondary | ICD-10-CM | POA: Diagnosis not present

## 2017-03-07 DIAGNOSIS — L700 Acne vulgaris: Secondary | ICD-10-CM | POA: Diagnosis not present

## 2017-03-21 ENCOUNTER — Other Ambulatory Visit: Payer: Self-pay

## 2017-03-21 DIAGNOSIS — Z3041 Encounter for surveillance of contraceptive pills: Secondary | ICD-10-CM

## 2017-03-21 MED ORDER — NORETHINDRONE-ETH ESTRADIOL 0.4-35 MG-MCG PO TABS: ORAL_TABLET | ORAL | 0 refills | Status: DC

## 2017-03-21 NOTE — Telephone Encounter (Signed)
Pt called to make her annual appt and the soonest we can get her in is past the time of her having enough birth control pills. I made her an appt and sent her in a month supply of pills to last her through until the appt.

## 2017-04-12 ENCOUNTER — Encounter: Payer: Self-pay | Admitting: Obstetrics & Gynecology

## 2017-04-12 ENCOUNTER — Ambulatory Visit (INDEPENDENT_AMBULATORY_CARE_PROVIDER_SITE_OTHER): Payer: BLUE CROSS/BLUE SHIELD | Admitting: Obstetrics & Gynecology

## 2017-04-12 VITALS — BP 110/79 | HR 97 | Ht 66.0 in | Wt 307.0 lb

## 2017-04-12 DIAGNOSIS — Z01419 Encounter for gynecological examination (general) (routine) without abnormal findings: Secondary | ICD-10-CM | POA: Diagnosis not present

## 2017-04-12 NOTE — Progress Notes (Signed)
Subjective:    Lisa Tran is a 41 y.o. M AA G68female who presents for an annual exam. The patient has no complaints today. The patient is sexually active. GYN screening history: last pap: was normal. The patient wears seatbelts: yes. The patient participates in regular exercise: yes. Has the patient ever been transfused or tattooed?: no. The patient reports that there is not domestic violence in her life.   Menstrual History: OB History    Gravida Para Term Preterm AB Living   1       1     SAB TAB Ectopic Multiple Live Births                  Menarche age: 41 Patient's last menstrual period was 04/05/2017.    The following portions of the patient's history were reviewed and updated as appropriate: allergies, current medications, past family history, past medical history, past social history, past surgical history and problem list.  Review of Systems Pertinent items are noted in HPI.   Married for 10 years Uses lubricant prn Works at [employer] [place]- no breast/gyn/ cancer, + colon cancer in her mGF   Objective:    BP 110/79   Pulse 97   Ht 5\' 6"  (1.676 m)   Wt (!) 307 lb (139.3 kg)   LMP 04/05/2017   BMI 49.55 kg/m   General Appearance:    Alert, cooperative, no distress, appears stated age  Head:    Normocephalic, without obvious abnormality, atraumatic  Eyes:    PERRL, conjunctiva/corneas clear, EOM's intact, fundi    benign, both eyes  Ears:    Normal TM's and external ear canals, both ears  Nose:   Nares normal, septum midline, mucosa normal, no drainage    or sinus tenderness  Throat:   Lips, mucosa, and tongue normal; teeth and gums normal  Neck:   Supple, symmetrical, trachea midline, no adenopathy;    thyroid:  no enlargement/tenderness/nodules; no carotid   bruit or JVD  Back:     Symmetric, no curvature, ROM normal, no CVA tenderness  Lungs:     Clear to auscultation bilaterally, respirations unlabored  Chest Wall:    No tenderness or deformity   Heart:    Regular rate and rhythm, S1 and S2 normal, no murmur, rub   or gallop  Breast Exam:    No tenderness, masses, or nipple abnormality  Abdomen:     Soft, non-tender, bowel sounds active all four quadrants,    no masses, no organomegaly  Genitalia:    Normal female without lesion, discharge or tenderness, nulliparous cervix, No palpable adnexal masses     Extremities:   Extremities normal, atraumatic, no cyanosis or edema  Pulses:   2+ and symmetric all extremities  Skin:   Skin color, texture, turgor normal, no rashes or lesions  Lymph nodes:   Cervical, supraclavicular, and axillary nodes normal  Neurologic:   CNII-XII intact, normal strength, sensation and reflexes    throughout  .    Assessment:    Healthy female exam.    Plan:     Mammogram. Thin prep Pap smear. with cotesting

## 2017-04-14 ENCOUNTER — Other Ambulatory Visit: Payer: Self-pay | Admitting: Obstetrics & Gynecology

## 2017-04-14 DIAGNOSIS — Z3041 Encounter for surveillance of contraceptive pills: Secondary | ICD-10-CM

## 2017-04-17 LAB — CYTOLOGY - PAP
Diagnosis: NEGATIVE
HPV: NOT DETECTED

## 2017-04-24 ENCOUNTER — Encounter: Payer: Self-pay | Admitting: Osteopathic Medicine

## 2017-04-24 ENCOUNTER — Ambulatory Visit (INDEPENDENT_AMBULATORY_CARE_PROVIDER_SITE_OTHER): Payer: BLUE CROSS/BLUE SHIELD | Admitting: Osteopathic Medicine

## 2017-04-24 VITALS — BP 127/79 | HR 94 | Temp 98.7°F | Wt 313.0 lb

## 2017-04-24 DIAGNOSIS — R1013 Epigastric pain: Secondary | ICD-10-CM | POA: Diagnosis not present

## 2017-04-24 MED ORDER — OMEPRAZOLE 40 MG PO CPDR: 40.0000 mg | DELAYED_RELEASE_CAPSULE | Freq: Every day | ORAL | 0 refills | Status: DC

## 2017-04-24 NOTE — Patient Instructions (Signed)
GI issues - I think this is a stomach issue (gastritis aka stomach inflammation/excess acid, hiatal hernia  Where stomach slides up through diaphragm, or irritation from scar tissue due to your surgery). I'm less suspicious of something really bad like a pancreas problem, infection of gallbladder or appendix or colon.   Plan at this point:  Let's look for some reasons and rule out some bad diseases! Labs, breath test for H. Pylori.   Let's get you feeling better! I've sent an antacid which will help stomach inflammation  Let's wait a few days! If no better, or if worse, we will get a CT scan of the abdomen and consider referral to a specialist.   Please call with any questions/concerns

## 2017-04-24 NOTE — Progress Notes (Signed)
HPI: Lisa Tran is a 41 y.o. female  who presents to [facility] today, 04/25/17,  for chief complaint of:  Chief Complaint  Patient presents with  . Abdominal Pain    x 5 days     . Location: worst in epigastrum, also present L upper side under rubs, middle of L side . Quality: aching, sharp pains, bloated, feels like catching w/ deep breaths  . Severity: 4/10 . Duration: 5 days total  . Modifying factors: Tried laxative 3 days ago (Mag Citrate) but not helpful. Gets a bit worse with inhalation, feels like somthing is stuck in upper abdomen  . Assoc signs/symptoms: no fever/chills. Loose BM. Started period today and normal.   Past medical, surgical, social and family history reviewed: Patient Active Problem List   Diagnosis Date Noted  . Cervical paraspinal muscle spasm 10/05/2015  . Iron deficiency 03/16/2015  . Other specified postprocedural states 02/25/2015  . Morbid obesity ([place]) 02/25/2015   Past Surgical History:  Procedure Laterality Date  . COSMETIC SURGERY    . GASTRIC BYPASS  2003  . KNEE SURGERY  2014   Social History  Substance Use Topics  . Smoking status: Never Smoker  . Smokeless tobacco: Never Used  . Alcohol use Yes     Comment: occ   Family History  Problem Relation Age of Onset  . Diabetes Maternal Aunt   . Diabetes Maternal Grandfather   . Cancer Maternal Grandfather        prostate  . Hypertension Maternal Aunt      Current medication list and allergy/intolerance information reviewed:   Current Outpatient Prescriptions  Medication Sig Dispense Refill  . albuterol (PROVENTIL HFA;VENTOLIN HFA) 108 (90 BASE) MCG/ACT inhaler Inhale 2 puffs into the lungs every 6 (six) hours as needed. For shortness of breath or wheezing    . Azelastine-Fluticasone 137-50 MCG/ACT SUSP Place 2 sprays into the nose 2 (two) times daily. 1 Bottle 3  . cetirizine (ZYRTEC) 10 MG tablet Take 10 mg by mouth daily.    .  Cholecalciferol (VITAMIN D3) 1000 units CAPS Take by mouth.    . hydrocortisone 2.5 % ointment Apply to affected area BID. 30 g 3  . montelukast (SINGULAIR) 10 MG tablet Take 1 tablet (10 mg total) by mouth at bedtime. 30 tablet 3  . Multiple Vitamin (MULTIVITAMIN WITH MINERALS) TABS Take 1 tablet by mouth daily.    . vitamin B-12 (CYANOCOBALAMIN) 100 MCG tablet Take 100 mcg by mouth daily.    . norethindrone-ethinyl estradiol (BALZIVA) 0.4-35 MG-MCG tablet TAKE 1 TABLET DAILY 90 tablet 3  . omeprazole (PRILOSEC) 40 MG capsule Take 1 capsule (40 mg total) by mouth daily. 30 capsule 0   No current facility-administered medications for this visit.    Allergies  Allergen Reactions  . Shellfish Allergy Anaphylaxis      Review of Systems:  Constitutional:  No  fever, no chills, No recent illness, No unintentional weight changes. No significant fatigue.   Cardiac: No  chest pain, No  pressure, No palpitations  Respiratory:  No  shortness of breath. No  Cough  Gastrointestinal: +abdominal pain, No  nausea, No  Vomiting, no heartburn,  No  blood in stool, No  diarrhea, No  constipation   Musculoskeletal: No new myalgia/arthralgia  Genitourinary: No  incontinence, No  abnormal genital bleeding, No abnormal genital discharge  Skin: No  Rash, No other wounds/concerning lesions  Hem/Onc: No  easy bruising/bleeding, No  abnormal lymph  node  Endocrine: No cold intolerance,  No heat intolerance. No polyuria/polydipsia/polyphagia   Neurologic: No  weakness, No  dizziness, No  slurred speech/focal weakness/facial droop  Psychiatric: No  concerns with depression, No  concerns with anxiety, No sleep problems, No mood problems  Exam:  BP 127/79   Pulse 94   Temp 98.7 F (37.1 C)   Wt (!) 313 lb (142 kg)   LMP 04/24/2017 (Exact Date)   SpO2 99%   BMI 50.52 kg/m   Constitutional: VS see above. General Appearance: alert, well-developed, well-nourished, NAD  Eyes: Normal lids and  conjunctive, non-icteric sclera  Ears, Nose, Mouth, Throat: MMM, Normal external inspection ears/nares/mouth/lips/gums.   Neck: No masses, trachea midline.   Respiratory: Normal respiratory effort. no wheeze, no rhonchi, no rales  Cardiovascular: S1/S2 normal, no murmur, no rub/gallop auscultated. RRR.   Gastrointestinal: (+)TTP epigastrum , no masses. No hepatomegaly, no splenomegaly. No hernia appreciated. Bowel sounds normal. Rectal exam deferred.   Musculoskeletal: Gait normal. No clubbing/cyanosis of digits.   Neurological: Normal balance/coordination. No tremor.  Skin: warm, dry, intact. No rash/ulcer.   Psychiatric: Normal judgment/insight. Normal mood and affect.    ASSESSMENT/PLAN:   Epigastric abdominal pain - Plan: CBC, COMPLETE METABOLIC PANEL WITH GFR, Lipid panel, TSH, Lipase, Amylase, omeprazole (PRILOSEC) 40 MG capsule    Patient Instructions  GI issues - I think this is a stomach issue (gastritis aka stomach inflammation/excess acid, hiatal hernia  Where stomach slides up through diaphragm, or irritation from scar tissue due to your surgery). I'm less suspicious of something really bad like a pancreas problem, infection of gallbladder or appendix or colon.   Plan at this point:  Let's look for some reasons and rule out some bad diseases! Labs, breath test for H. Pylori.   Let's get you feeling better! I've sent an antacid which will help stomach inflammation  Let's wait a few days! If no better, or if worse, we will get a CT scan of the abdomen and consider referral to a specialist.   Please call with any questions/concerns     Visit summary with medication list and pertinent instructions was printed for patient to review. All questions at time of visit were answered - patient instructed to contact office with any additional concerns. ER/RTC precautions were reviewed with the patient. Follow-up plan: Return for abdominal pain if no better 3-4 days .

## 2017-04-25 ENCOUNTER — Telehealth: Payer: Self-pay | Admitting: *Deleted

## 2017-04-25 DIAGNOSIS — Z3041 Encounter for surveillance of contraceptive pills: Secondary | ICD-10-CM

## 2017-04-25 LAB — CBC
HCT: 29.8 % — ABNORMAL LOW (ref 35.0–45.0)
Hemoglobin: 9 g/dL — ABNORMAL LOW (ref 11.7–15.5)
MCH: 21.8 pg — ABNORMAL LOW (ref 27.0–33.0)
MCHC: 30.2 g/dL — ABNORMAL LOW (ref 32.0–36.0)
MCV: 72.2 fL — ABNORMAL LOW (ref 80.0–100.0)
MPV: 8.1 fL (ref 7.5–12.5)
Platelets: 488 10*3/uL — ABNORMAL HIGH (ref 140–400)
RBC: 4.13 MIL/uL (ref 3.80–5.10)
RDW: 17.2 % — ABNORMAL HIGH (ref 11.0–15.0)
WBC: 5.6 10*3/uL (ref 3.8–10.8)

## 2017-04-25 LAB — COMPLETE METABOLIC PANEL WITH GFR
ALT: 15 U/L (ref 6–29)
AST: 21 U/L (ref 10–30)
Albumin: 3.7 g/dL (ref 3.6–5.1)
Alkaline Phosphatase: 71 U/L (ref 33–115)
BUN: 7 mg/dL (ref 7–25)
CO2: 25 mmol/L (ref 20–31)
Calcium: 8.7 mg/dL (ref 8.6–10.2)
Chloride: 103 mmol/L (ref 98–110)
Creat: 0.74 mg/dL (ref 0.50–1.10)
GFR, Est African American: >89 mL/min (ref >=60)
GFR, Est Non African American: >89 mL/min (ref >=60)
Glucose, Bld: 80 mg/dL (ref 65–99)
Potassium: 4.1 mmol/L (ref 3.5–5.3)
Sodium: 138 mmol/L (ref 135–146)
Total Bilirubin: 0.4 mg/dL (ref 0.2–1.2)
Total Protein: 6.8 g/dL (ref 6.1–8.1)

## 2017-04-25 LAB — LIPID PANEL
Cholesterol: 173 mg/dL (ref <200)
HDL: 115 mg/dL (ref >50)
LDL Cholesterol: 46 mg/dL (ref <100)
Total CHOL/HDL Ratio: 1.5 Ratio (ref <5.0)
Triglycerides: 62 mg/dL (ref <150)
VLDL: 12 mg/dL (ref <30)

## 2017-04-25 LAB — TSH: TSH: 2.27 mIU/L

## 2017-04-25 MED ORDER — NORETHINDRONE-ETH ESTRADIOL 0.4-35 MG-MCG PO TABS: ORAL_TABLET | ORAL | 3 refills | Status: DC

## 2017-04-25 NOTE — Telephone Encounter (Signed)
Pt called stating that her RX for Hulda Humphrey was never sent to [facility].  RX sent today.

## 2017-04-26 ENCOUNTER — Other Ambulatory Visit: Payer: Self-pay | Admitting: *Deleted

## 2017-04-26 LAB — AMYLASE: Amylase: 40 U/L (ref 21–101)

## 2017-04-26 LAB — LIPASE: Lipase: 21 U/L (ref 7–60)

## 2017-04-26 MED ORDER — ALBUTEROL SULFATE HFA 108 (90 BASE) MCG/ACT IN AERS: 2.0000 | INHALATION_SPRAY | Freq: Four times a day (QID) | RESPIRATORY_TRACT | 3 refills | Status: DC | PRN

## 2017-07-13 ENCOUNTER — Encounter: Payer: Self-pay | Admitting: *Deleted

## 2017-07-13 ENCOUNTER — Emergency Department
Admission: EM | Admit: 2017-07-13 | Discharge: 2017-07-13 | Disposition: A | Discharge status: Home / Self Care | Payer: BLUE CROSS/BLUE SHIELD | Source: Home / Self Care | Attending: Family Medicine | Admitting: Family Medicine

## 2017-07-13 ENCOUNTER — Ambulatory Visit (HOSPITAL_BASED_OUTPATIENT_CLINIC_OR_DEPARTMENT_OTHER)
Admit: 2017-07-13 | Discharge: 2017-07-13 | Disposition: A | Discharge status: Home / Self Care | Payer: BLUE CROSS/BLUE SHIELD | Attending: Family Medicine | Admitting: Family Medicine

## 2017-07-13 ENCOUNTER — Telehealth: Payer: Self-pay | Admitting: Emergency Medicine

## 2017-07-13 DIAGNOSIS — I8002 Phlebitis and thrombophlebitis of superficial vessels of left lower extremity: Secondary | ICD-10-CM

## 2017-07-13 DIAGNOSIS — M79605 Pain in left leg: Secondary | ICD-10-CM | POA: Diagnosis not present

## 2017-07-13 DIAGNOSIS — IMO0002 Reserved for concepts with insufficient information to code with codable children: Secondary | ICD-10-CM

## 2017-07-13 DIAGNOSIS — R229 Localized swelling, mass and lump, unspecified: Secondary | ICD-10-CM

## 2017-07-13 MED ORDER — APIXABAN 5 MG PO TABS: ORAL_TABLET | ORAL | 0 refills | Status: DC

## 2017-07-13 MED ORDER — IBUPROFEN 200 MG PO TABS: 400.0000 mg | ORAL_TABLET | Freq: Four times a day (QID) | ORAL | 0 refills | Status: DC | PRN

## 2017-07-13 MED ORDER — APIXABAN (ELIQUIS) EDUCATION KIT FOR DVT/PE PATIENTS: 1.0000 | PACK | Freq: Once | 0 refills | Status: DC

## 2017-07-13 MED ORDER — APIXABAN (ELIQUIS) EDUCATION KIT FOR DVT/PE PATIENTS: 1.0000 | PACK | Freq: Once | 0 refills | Status: AC

## 2017-07-13 NOTE — ED Triage Notes (Signed)
Pt c/o knots on the posterior side of her LLE x 5 days with pain, some minimal redness and warmth. She is taking IBF at bedtime. She reports a recent flight from [place].

## 2017-07-13 NOTE — Telephone Encounter (Signed)
Ultrasound report from [facility] discussed with pt.  Based on location of superficial thromboembolus, per UpToDate, pt is at high risk for DVT.  Will start pt on Eliquis and have pt f/u with PCP next week for monitoring of medication and symptoms.  Pt did come back to [facility] to pick up prescription and further discuss dx and treatment. Coupon provided for pt.

## 2017-07-13 NOTE — ED Provider Notes (Signed)
[facility]    CSN: [id number] Arrival date & time: 07/13/17  0815     History   Chief Complaint Chief Complaint  Patient presents with  . Mass    HPI Lisa Tran is a 41 y.o. female.   HPI  Lisa Tran is a 41 y.o. female presenting to UC with c/o 5 days of gradually worsening painful knots on the back and medial aspect of Left knee and lower leg.  Pain is aching and sore, 5/10.  Knots seem to be getting larger. Denies itching to the knots. No known bug bites.  She has taken ibuprofen at night to help her sleep. She reports recent flight from [place].  Denies chest pain or SOB. No prior hx of blood clots but notes her grandfather had a blood clot.  She is not aware of circumstances surrounding his blood clot.   Past Medical History:  Diagnosis Date  . Allergy   . Asthma     Patient Active Problem List   Diagnosis Date Noted  . Cervical paraspinal muscle spasm 10/05/2015  . Iron deficiency 03/16/2015  . Other specified postprocedural states 02/25/2015  . Morbid obesity ([place]) 02/25/2015    Past Surgical History:  Procedure Laterality Date  . COSMETIC SURGERY    . GASTRIC BYPASS  2003  . KNEE SURGERY  2014    OB History    Gravida Para Term Preterm AB Living   1       1     SAB TAB Ectopic Multiple Live Births                   Home Medications    Prior to Admission medications   Medication Sig Start Date End Date Taking? Authorizing Provider  cetirizine (ZYRTEC) 10 MG tablet Take 10 mg by mouth daily.   Yes [provider]  norethindrone-ethinyl estradiol (BALZIVA) 0.4-35 MG-MCG tablet TAKE 1 TABLET DAILY 04/25/17  Yes Dove, Myra C, MD  albuterol (PROVENTIL HFA;VENTOLIN HFA) 108 (90 Base) MCG/ACT inhaler Inhale 2 puffs into the lungs every 6 (six) hours as needed. For shortness of breath or wheezing 04/26/17   Emeterio Reeve, DO  apixaban (ELIQUIS) 5 MG TABS tablet Take 16m twice daily by mouth for 7 days, take 529mdaily  07/13/17   PhNoe GensPA-C  apixaban (ELIQUIS) KIT 1 kit by Does not apply route once. 07/13/17 07/13/17  PhNoe GensPA-C  Azelastine-Fluticasone 137-50 MCG/ACT SUSP Place 2 sprays into the nose 2 (two) times daily. 05/02/16   AlEmeterio ReeveDO  Cholecalciferol (VITAMIN D3) 1000 units CAPS Take by mouth.    [provider]  montelukast (SINGULAIR) 10 MG tablet Take 1 tablet (10 mg total) by mouth at bedtime. 05/02/16   AlEmeterio ReeveDO  Multiple Vitamin (MULTIVITAMIN WITH MINERALS) TABS Take 1 tablet by mouth daily.    [provider]  omeprazole (PRILOSEC) 40 MG capsule Take 1 capsule (40 mg total) by mouth daily. 04/24/17   AlEmeterio ReeveDO  vitamin B-12 (CYANOCOBALAMIN) 100 MCG tablet Take 100 mcg by mouth daily.    [provider]    Family History Family History  Problem Relation Age of Onset  . Diabetes Maternal Aunt   . Diabetes Maternal Grandfather   . Cancer Maternal Grandfather        prostate  . Hypertension Maternal Aunt   . Asthma Mother     Social History Social History  Substance Use Topics  . Smoking  status: Never Smoker  . Smokeless tobacco: Never Used  . Alcohol use Yes     Comment: occ     Allergies   Shellfish allergy   Review of Systems Review of Systems  Musculoskeletal: Positive for myalgias. Negative for arthralgias and back pain.  Skin: Positive for color change. Negative for wound.     Physical Exam Triage Vital Signs ED Triage Vitals  Enc Vitals Group     BP 07/13/17 0844 134/82     Pulse Rate 07/13/17 0844 79     Resp 07/13/17 0844 16     Temp 07/13/17 0844 97.8 F (36.6 C)     Temp Source 07/13/17 0844 Oral     SpO2 07/13/17 0844 100 %     Weight 07/13/17 0845 (!) 309 lb (140.2 kg)     Height 07/13/17 0845 5' 5.5" (1.664 m)     Head Circumference --      Peak Flow --      Pain Score 07/13/17 0845 5     Pain Loc --      Pain Edu? --      Excl. in [place]? --    No data  found.   Updated Vital Signs BP 134/82 (BP Location: Left Arm)   Pulse 79   Temp 97.8 F (36.6 C) (Oral)   Resp 16   Ht 5' 5.5" (1.664 m)   Wt (!) 309 lb (140.2 kg)   LMP 06/18/2017   SpO2 100%   BMI 50.64 kg/m   Visual Acuity Right Eye Distance:   Left Eye Distance:   Bilateral Distance:    Right Eye Near:   Left Eye Near:    Bilateral Near:     Physical Exam  Constitutional: She is oriented to person, place, and time. She appears well-developed and well-nourished.  HENT:  Head: Normocephalic and atraumatic.  Eyes: EOM are normal.  Neck: Normal range of motion.  Cardiovascular: Normal rate.   Pulmonary/Chest: Effort normal.  Musculoskeletal: Normal range of motion. She exhibits tenderness. She exhibits no edema.       Legs: Left leg: medial/postior aspect of knee and proximal lower leg- 4 hard tender nodules.  Full ROM knee.  Neurological: She is alert and oriented to person, place, and time.  Skin: Skin is warm and dry.  Left knee/lower leg: skin in tact. Faint erythema over larger knot.   Psychiatric: She has a normal mood and affect. Her behavior is normal.  Nursing note and vitals reviewed.    UC Treatments / Results  Labs (all labs ordered are listed, but only abnormal results are displayed) Labs Reviewed - No data to display  EKG  EKG Interpretation None       Radiology US Venous Img Lower Unilateral Left  Result Date: 07/13/2017 CLINICAL DATA:  41 year old female with left knee and calf pain. Recent air travel from [place]. EXAM: LEFT LOWER EXTREMITY VENOUS DOPPLER ULTRASOUND TECHNIQUE: Gray-scale sonography with graded compression, as well as color Doppler and duplex ultrasound were performed to evaluate the lower extremity deep venous systems from the level of the common femoral vein and including the common femoral, femoral, profunda femoral, popliteal and calf veins including the posterior tibial, peroneal and gastrocnemius veins when visible. The  superficial great saphenous vein was also interrogated. Spectral Doppler was utilized to evaluate flow at rest and with distal augmentation maneuvers in the common femoral, femoral and popliteal veins. COMPARISON:  None. FINDINGS: Contralateral Common Femoral Vein: Respiratory phasicity is normal  and symmetric with the symptomatic side. No evidence of thrombus. Normal compressibility. Common Femoral Vein: No evidence of thrombus. Normal compressibility, respiratory phasicity and response to augmentation. Saphenofemoral Junction: No evidence of thrombus. Normal compressibility and flow on color Doppler imaging. Profunda Femoral Vein: No evidence of thrombus. Normal compressibility and flow on color Doppler imaging. Femoral Vein: No evidence of thrombus. Normal compressibility, respiratory phasicity and response to augmentation. Popliteal Vein: No evidence of thrombus. Normal compressibility, respiratory phasicity and response to augmentation. Calf Veins: No evidence of thrombus. Normal compressibility and flow on color Doppler imaging. Superficial Great Saphenous Vein: Beginning at about the level of the knee, the great saphenous vein is noncompressible in the lumen is expanded with internal echogenic material consistent with thrombus. Additionally, there is a superficial venous varicosity in the soft tissues of the calf which arises from the great saphenous vein which is thrombosed. The GSV is patent and compressible proximally, in the midthigh an again at the ankle. Venous Reflux:  None. Other Findings:  None. IMPRESSION: 1. Positive for superficial thrombophlebitis of a segment of the great saphenous vein at the knee and a branch superficial venous varicosity in the calf. 2. No evidence of deep venous thrombosis. Signed, Criselda Peaches, MD [facility] Electronically Signed   By: Jacqulynn Cadet M.D.   On: 07/13/2017 17:44     Procedures Procedures (including critical care time)  Medications Ordered in UC Medications - No data to display   Initial Impression / Assessment and Plan / UC Course  I have reviewed the triage vital signs and the nursing notes.  Pertinent labs & imaging results that were available during my care of the patient were reviewed by me and considered in my medical decision making (see chart for details).     Concern for DVT.  U/S unavailable at this facility at this time Pt scheduled for U/S at [facility] today.   Ultrasound shows a superficial thrombophlebitis in segment of great saphenous vein.  Per UpToDate, pt is a high risk for DVT.  Will have her discontinue taking ibuprofen and start her on Eliquis.  Discussed results and treatment plan with pt over the phone and in person when she came back to [facility] to pick up prescription. Encouraged to schedule a f/u appointment with her PCP next week for recheck of symptoms and monitoring of treatment.   Final Clinical Impressions(s) / UC Diagnoses   Final diagnoses:  Mass  Left leg pain  Superficial thrombophlebitis of left leg    New Prescriptions Discharge Medication List as of 07/13/2017  8:49 AM       Controlled Substance Prescriptions [place] Controlled Substance Registry consulted? Not Applicable   Tyrell Antonio 07/13/17 1820

## 2017-07-14 ENCOUNTER — Telehealth: Payer: Self-pay | Admitting: Emergency Medicine

## 2017-07-14 NOTE — Telephone Encounter (Signed)
Left VM to call us and report on how she is doing and how rx for Eliquis is being tolerated.

## 2017-07-16 ENCOUNTER — Telehealth: Payer: Self-pay | Admitting: *Deleted

## 2017-07-16 ENCOUNTER — Encounter: Payer: Self-pay | Admitting: Physician Assistant

## 2017-07-16 ENCOUNTER — Ambulatory Visit (INDEPENDENT_AMBULATORY_CARE_PROVIDER_SITE_OTHER): Payer: BLUE CROSS/BLUE SHIELD | Admitting: Physician Assistant

## 2017-07-16 VITALS — BP 123/85 | HR 76 | Temp 98.2°F | Wt 309.0 lb

## 2017-07-16 DIAGNOSIS — I8002 Phlebitis and thrombophlebitis of superficial vessels of left lower extremity: Secondary | ICD-10-CM

## 2017-07-16 MED ORDER — AMBULATORY NON FORMULARY MEDICATION: 0 refills | Status: DC

## 2017-07-16 MED ORDER — DICLOFENAC SODIUM 75 MG PO TBEC: 75.0000 mg | DELAYED_RELEASE_TABLET | Freq: Two times a day (BID) | ORAL | 0 refills | Status: DC

## 2017-07-16 NOTE — Patient Instructions (Addendum)
-   Stop Eliquis - Start Diclofenac tomorrow - 1 tablet twice a day - Okay to continue Tylenol - Repeat ultrasound in 4 days (1 week out)  - stop birth control pill - use condoms - follow-up with Dr. Hulan Fray regarding copper IUD

## 2017-07-16 NOTE — Progress Notes (Signed)
HPI:                                                                Lisa Tran is a 41 y.o. female who presents to [facility] today for urgent care follow-up / acute superficial thrombophlebitis   Patient with new diagnosis of superficial thrombophlebitis of the left lower extremity presents today for follow-up. Patient was diagnosed in urgent care on 07/13/17 and started on Eliquis. She is on OCP's and had recently returned from a long flight to [place]. She reports discomfort today that is unchanged from 4 days ago and swelling. Pain is moderate, persistent. She has been compliant with Eliquis. Denies chest pain or SOB.  Past Medical History:  Diagnosis Date  . Allergy   . Asthma    Past Surgical History:  Procedure Laterality Date  . COSMETIC SURGERY    . GASTRIC BYPASS  2003  . KNEE SURGERY  2014   Social History  Substance Use Topics  . Smoking status: Never Smoker  . Smokeless tobacco: Never Used  . Alcohol use Yes     Comment: occ   family history includes Asthma in her mother; Cancer in her maternal grandfather; Diabetes in her maternal aunt and maternal grandfather; Hypertension in her maternal aunt.  ROS: negative except as noted in the HPI  Medications: Current Outpatient Prescriptions  Medication Sig Dispense Refill  . albuterol (PROVENTIL HFA;VENTOLIN HFA) 108 (90 Base) MCG/ACT inhaler Inhale 2 puffs into the lungs every 6 (six) hours as needed. For shortness of breath or wheezing 1 Inhaler 3  . apixaban (ELIQUIS) 5 MG TABS tablet Take 10mg  twice daily by mouth for 7 days, take 5mg  daily 30 tablet 0  . Azelastine-Fluticasone 137-50 MCG/ACT SUSP Place 2 sprays into the nose 2 (two) times daily. 1 Bottle 3  . cetirizine (ZYRTEC) 10 MG tablet Take 10 mg by mouth daily.    . Cholecalciferol (VITAMIN D3) 1000 units CAPS Take by mouth.    . montelukast (SINGULAIR) 10 MG tablet Take 1 tablet (10 mg total) by mouth at  bedtime. 30 tablet 3  . Multiple Vitamin (MULTIVITAMIN WITH MINERALS) TABS Take 1 tablet by mouth daily.    . norethindrone-ethinyl estradiol (BALZIVA) 0.4-35 MG-MCG tablet TAKE 1 TABLET DAILY 90 tablet 3  . omeprazole (PRILOSEC) 40 MG capsule Take 1 capsule (40 mg total) by mouth daily. 30 capsule 0  . vitamin B-12 (CYANOCOBALAMIN) 100 MCG tablet Take 100 mcg by mouth daily.     No current facility-administered medications for this visit.    Allergies  Allergen Reactions  . Shellfish Allergy Anaphylaxis       Objective:  BP 123/85   Pulse 76   Wt (!) 309 lb (140.2 kg)   LMP 06/18/2017   SpO2 100%   BMI 50.64 kg/m  Gen:  alert, not ill-appearing, no distress, appropriate for age, morbidly obese female HEENT: head normocephalic without obvious abnormality, conjunctiva and cornea clear, trachea midline Pulm: Normal work of breathing, normal phonation Neuro: alert and oriented x 3, no tremor MSK: extremities atraumatic, normal gait and station Skin: warm, dry, intact; medial aspect of left proximal calf with hyperpigmented/erythematous patch with firm, tender nodules  CLINICAL DATA:  41 year old female with left  knee and calf pain. Recent air travel from [place].  EXAM: LEFT LOWER EXTREMITY VENOUS DOPPLER ULTRASOUND  TECHNIQUE: Gray-scale sonography with graded compression, as well as color Doppler and duplex ultrasound were performed to evaluate the lower extremity deep venous systems from the level of the common femoral vein and including the common femoral, femoral, profunda femoral, popliteal and calf veins including the posterior tibial, peroneal and gastrocnemius veins when visible. The superficial great saphenous vein was also interrogated. Spectral Doppler was utilized to evaluate flow at rest and with distal augmentation maneuvers in the common femoral, femoral and popliteal veins.  COMPARISON:  None.  FINDINGS: Contralateral Common Femoral Vein:  Respiratory phasicity is normal and symmetric with the symptomatic side. No evidence of thrombus. Normal compressibility.  Common Femoral Vein: No evidence of thrombus. Normal compressibility, respiratory phasicity and response to augmentation.  Saphenofemoral Junction: No evidence of thrombus. Normal compressibility and flow on color Doppler imaging.  Profunda Femoral Vein: No evidence of thrombus. Normal compressibility and flow on color Doppler imaging.  Femoral Vein: No evidence of thrombus. Normal compressibility, respiratory phasicity and response to augmentation.  Popliteal Vein: No evidence of thrombus. Normal compressibility, respiratory phasicity and response to augmentation.  Calf Veins: No evidence of thrombus. Normal compressibility and flow on color Doppler imaging.  Superficial Great Saphenous Vein: Beginning at about the level of the knee, the great saphenous vein is noncompressible in the lumen is expanded with internal echogenic material consistent with thrombus. Additionally, there is a superficial venous varicosity in the soft tissues of the calf which arises from the great saphenous vein which is thrombosed. The GSV is patent and compressible proximally, in the midthigh an again at the ankle.  Venous Reflux:  None.  Other Findings:  None.  IMPRESSION: 1. Positive for superficial thrombophlebitis of a segment of the great saphenous vein at the knee and a branch superficial venous varicosity in the calf. 2. No evidence of deep venous thrombosis. Signed,  Criselda Peaches, MD  [facility]  [facility]   Electronically Signed   By: Jacqulynn Cadet M.D.   On: 07/13/2017 17:44  No results found for this or any previous visit (from the past 72 hour(s)).     Assessment and Plan: 41 y.o. female with   1. Thrombophlebitis of superficial veins of left lower extremity - personally  reviewed ultrasound report form 07/13/2017 - discontinuing Eliquis. No DVT and thrombus is not at the saphenofemoral junction or extensive. This was reviewed with Dr. Aundria Mems as well. Starting NSAID tomorrow, which will also help with symptomatic relief. Compression stockings. Will re-assess with ultrasound at 1 week out. Recommend follow-up ultrasound since patient is not being anticoagulated. - discontinuing OCP's. Use condoms. Follow-up with [facility] for contraceptive options - diclofenac (VOLTAREN) 75 MG EC tablet; Take 1 tablet (75 mg total) by mouth 2 (two) times daily.  Dispense: 30 tablet; Refill: 0 - AMBULATORY NON FORMULARY MEDICATION; Knee-high, medium/grade II compression, graduated compression stockings. Large circ Apply to lower extremities.  Dispense: 1 each; Refill: 0 - US Venous Img Lower Unilateral Left; Future   Patient education and anticipatory guidance given Patient agrees with treatment plan Follow-up with PCP in 1 week or sooner as needed if symptoms worsen or fail to improve  Darlyne Russian PA-C

## 2017-07-16 NOTE — Telephone Encounter (Signed)
Spoke to pt she reports that her leg is more swollen. She is currently in the primary care office next door being seen for a reevaluation.

## 2017-07-18 ENCOUNTER — Ambulatory Visit: Payer: BLUE CROSS/BLUE SHIELD

## 2017-07-18 ENCOUNTER — Other Ambulatory Visit: Payer: Self-pay | Admitting: Obstetrics & Gynecology

## 2017-07-18 DIAGNOSIS — R928 Other abnormal and inconclusive findings on diagnostic imaging of breast: Secondary | ICD-10-CM

## 2017-07-19 ENCOUNTER — Ambulatory Visit: Payer: BLUE CROSS/BLUE SHIELD

## 2017-07-19 ENCOUNTER — Encounter: Payer: Self-pay | Admitting: Obstetrics & Gynecology

## 2017-07-19 ENCOUNTER — Ambulatory Visit (INDEPENDENT_AMBULATORY_CARE_PROVIDER_SITE_OTHER): Payer: BLUE CROSS/BLUE SHIELD | Admitting: Obstetrics & Gynecology

## 2017-07-19 VITALS — BP 130/87 | HR 82 | Wt 307.0 lb

## 2017-07-19 DIAGNOSIS — I8002 Phlebitis and thrombophlebitis of superficial vessels of left lower extremity: Secondary | ICD-10-CM

## 2017-07-19 DIAGNOSIS — I809 Phlebitis and thrombophlebitis of unspecified site: Secondary | ICD-10-CM | POA: Diagnosis not present

## 2017-07-19 DIAGNOSIS — Z3040 Encounter for surveillance of contraceptives, unspecified: Secondary | ICD-10-CM

## 2017-07-19 NOTE — Progress Notes (Signed)
   Subjective:    Patient ID: Lisa Tran, female    DOB: 06-17-1976, 41 y.o.   MRN: [id number]  HPI 41 yo MAA P0 here to discuss contraception options.  She was diagnosed with a superficial, NOT DVT, recently after a trip back from [place]. She immediately stopped her OCPs.   Review of Systems She and her husband do NOT want kids.    Objective:   Physical Exam Pleasant morbidly obese Black female No apparent distress Breathing, conversing, and ambulating normally     Assessment & Plan:  Contraception- Since this was not a DVT, she was offered return to OCPs, try POPs, any IUD, BTL/BS, condoms,  or vasectomy for her husband. They will consider these options and let me know what they decide.

## 2017-07-20 ENCOUNTER — Telehealth: Payer: Self-pay | Admitting: Osteopathic Medicine

## 2017-07-20 NOTE — Telephone Encounter (Signed)
Error - forwarded request to OBGYN who is managing [facility] and ordered other breast imaging already

## 2017-07-20 NOTE — Progress Notes (Signed)
Ultrasound shows improvement in the thrombophlebitis. There is no DVT The lower thigh is still affected. Continue anti-inflammatory. Follow-up with PCP in 2 weeks

## 2017-07-20 NOTE — Telephone Encounter (Signed)
-----   Message from Lisa Tran sent at 07/18/2017  8:02 AM EDT ----- Good Morning! The above pt has to have a Diagnostic Mammo per her prior report from 01/2016! If you could put the diagnotic order in for [facility] they will call and schedule her! Thanks so much!

## 2017-07-21 DIAGNOSIS — I8002 Phlebitis and thrombophlebitis of superficial vessels of left lower extremity: Secondary | ICD-10-CM | POA: Insufficient documentation

## 2017-07-23 ENCOUNTER — Ambulatory Visit (INDEPENDENT_AMBULATORY_CARE_PROVIDER_SITE_OTHER): Payer: BLUE CROSS/BLUE SHIELD | Admitting: Osteopathic Medicine

## 2017-07-23 ENCOUNTER — Encounter: Payer: Self-pay | Admitting: Osteopathic Medicine

## 2017-07-23 DIAGNOSIS — I8002 Phlebitis and thrombophlebitis of superficial vessels of left lower extremity: Secondary | ICD-10-CM

## 2017-07-23 MED ORDER — DICLOFENAC SODIUM 75 MG PO TBEC: 75.0000 mg | DELAYED_RELEASE_TABLET | Freq: Two times a day (BID) | ORAL | 1 refills | Status: DC

## 2017-07-23 NOTE — Progress Notes (Signed)
HPI: Lisa Tran is a 41 y.o. female  who presents to [name] today, 07/23/17,  for chief complaint of:  Chief Complaint  Patient presents with  . Follow-up    thrombophlebitis     . Seen by urgent care 07/13/17 and by a colleague in this office 07/16/17. Initially placed on L at quest for superficial thrombophlebitis, that I can find no documentation that this was truly high risk, see algorithm below for criteria for anticoagulation.  . Patient states that after being taken off anticoagulation at her follow-up visit, she has felt fine, has been compliant with rest, elevation, warm compresses and anti-inflammatories and has noticed improvement in symptoms.   Past medical history, surgical history, social history and family history reviewed.  Patient Active Problem List   Diagnosis Date Noted  . 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 Cervical paraspinal muscle spasm 10/05/2015  . Iron deficiency 03/16/2015  . Other specified postprocedural states 02/25/2015  . Morbid obesity ([place]) 02/25/2015    Current medication list and allergy/intolerance information reviewed.   Current Outpatient Prescriptions on File Prior to Visit  Medication Sig Dispense Refill  . albuterol (PROVENTIL HFA;VENTOLIN HFA) 108 (90 Base) MCG/ACT inhaler Inhale 2 puffs into the lungs every 6 (six) hours as needed. For shortness of breath or wheezing 1 Inhaler 3  . AMBULATORY NON FORMULARY MEDICATION Knee-high, medium/grade II compression, graduated compression stockings. Large circ Apply to lower extremities. 1 each 0  . Azelastine-Fluticasone 137-50 MCG/ACT SUSP Place 2 sprays into the nose 2 (two) times daily. 1 Bottle 3  . cetirizine (ZYRTEC) 10 MG tablet Take 10 mg by mouth daily.    . Cholecalciferol (VITAMIN D3) 1000 units CAPS Take by mouth.    . diclofenac (VOLTAREN) 75 MG EC tablet Take 1 tablet (75 mg total) by mouth 2 (two)  times daily. 30 tablet 0  . ELIQUIS STARTER PACK (ELIQUIS STARTER PACK) 5 MG TABS See admin instructions.  0  . montelukast (SINGULAIR) 10 MG tablet Take 1 tablet (10 mg total) by mouth at bedtime. 30 tablet 3  . Multiple Vitamin (MULTIVITAMIN WITH MINERALS) TABS Take 1 tablet by mouth daily.    . vitamin B-12 (CYANOCOBALAMIN) 100 MCG tablet Take 100 mcg by mouth daily.     No current facility-administered medications on file prior to visit.    Allergies  Allergen Reactions  . Shellfish Allergy Anaphylaxis      Review of Systems:  Cardiac: No  chest pain, No  pressure, No palpitations  Respiratory:  No  shortness of breath. No  Cough  Gastrointestinal: No  abdominal pain  Musculoskeletal: No new myalgia/arthralgia  Skin: No  Rash, +lmps under skin where involved veins are   Hem/Onc: No  easy bruising/bleeding  Neurologic: No  weakness, No  Dizziness   Exam:  BP 132/80   Pulse 75   Ht 5' 5.5" (1.664 m)   Wt (!) 304 lb (137.9 kg)   LMP 07/16/2017   BMI 49.82 kg/m   Constitutional: VS see above. General Appearance: alert, well-developed, well-nourished, NAD  Eyes: Normal lids and conjunctive, non-icteric sclera  Respiratory: Normal respiratory effort. no wheeze, no rhonchi, no rales  Cardiovascular: S1/S2 normal, no murmur, no rub/gallop auscultated. RRR. Palpable superficial thrombosed veins, slight tenderness, no erythema, no lower extremity edema, Homans sign negative bilaterally  Musculoskeletal: Gait normal. Symmetric and independent movement of all extremities  Neurological: Normal balance/coordination. No tremor.  Skin: warm, dry, intact.  Psychiatric: Normal judgment/insight. Normal mood and affect. Oriented x3.      US Venous Img Lower Unilateral Left  Result Date: 07/19/2017 CLINICAL DATA:  Follow-up superficial thrombophlebitis. EXAM: LEFT LOWER EXTREMITY VENOUS DOPPLER ULTRASOUND TECHNIQUE: Gray-scale sonography with graded compression, as well  as color Doppler and duplex ultrasound were performed to evaluate the lower extremity deep venous systems from the level of the common femoral vein and including the common femoral, femoral, profunda femoral, popliteal and calf veins including the posterior tibial, peroneal and gastrocnemius veins when visible. The superficial great saphenous vein was also interrogated. Spectral Doppler was utilized to evaluate flow at rest and with distal augmentation maneuvers in the common femoral, femoral and popliteal veins. COMPARISON:  Left lower extremity venous Doppler ultrasound - 07/13/2017 FINDINGS: Contralateral Common Femoral Vein: Respiratory phasicity is normal and symmetric with the symptomatic side. No evidence of thrombus. Normal compressibility. Common Femoral Vein: No evidence of thrombus. Normal compressibility, respiratory phasicity and response to augmentation. Saphenofemoral Junction: No evidence of thrombus. Normal compressibility and flow on color Doppler imaging. Profunda Femoral Vein: No evidence of thrombus. Normal compressibility and flow on color Doppler imaging. Femoral Vein: No evidence of thrombus. Normal compressibility, respiratory phasicity and response to augmentation. Popliteal Vein: No evidence of thrombus. Normal compressibility, respiratory phasicity and response to augmentation. Calf Veins: No evidence of thrombus. Normal compressibility and flow on color Doppler imaging. Superficial Great Saphenous Vein: No evidence of acute or chronic superficial thrombophlebitis. Venous Reflux:  None. Other Findings: Grossly unchanged occlusive superficial thrombophlebitis involving several prominent varicosities about the medial aspect of the distal thigh (images 32 through 36). IMPRESSION: 1. No evidence of acute or chronic DVT within the left lower extremity. 2. Resolved superficial thrombophlebitis within the left greater saphenous vein. 3. Grossly unchanged occlusive superficial thrombophlebitis  involving several prominent superficial varicosities within the medial aspect of the distal left thigh. Electronically Signed   By: Sandi Mariscal M.D.   On: 07/19/2017 15:00    [place] results 07/13/17: IMPRESSION: 1. Positive for superficial thrombophlebitis of a segment of the great saphenous vein at the knee and a branch superficial venous varicosity in the calf. 2. No evidence of deep venous thrombosis  No flowsheet data found.  Depression screen PHQ 2/9 07/16/2017  Decreased Interest 0  Down, Depressed, Hopeless 0  PHQ - 2 Score 0      ASSESSMENT/PLAN:   See UpToDate info below - Patient doing well off of anticoagulation, continue current course of treatment and RTC if worse. She is following with OB/GYN for alternative to estrogen-containing contraception.  Thrombophlebitis of superficial veins of left lower extremity - Plan: diclofenac (VOLTAREN) 75 MG EC tablet     Follow-up plan: Return for when due for physical, sooner if needed .  Visit summary with medication list and pertinent instructions was printed for patient to review, alert [place] if any changes needed. All questions at time of visit were answered - patient instructed to contact office with any additional concerns. ER/RTC precautions were reviewed with the patient and understanding verbalized.   Note: Total time spent 25 minutes, greater than 50% of the visit was spent face-to-face counseling and coordinating care for the following: The encounter diagnosis was Thrombophlebitis of superficial veins of left lower extremity.Marland Kitchen      Appendix: from UpToDate 2018  Phlebitis and thrombosis of the superficial lower extremity veins Author: Meryle Ready, MD, FACS    Treatment of superficial thrombophlebitis  At 45 days, if the clinical issue has resolved, the  anticoagulation stops. There are two other scenarios: Patient still has ongoing clinical signs and symptoms (redness, tenderness). In this case, the anticoagulation  should be continued and the patient worked up for underlying etiologies of superficial thrombophlebitis, like malignant and hypercoagulable state. The clinical redness and tenderness is gone, but on follow-up duplex examination there is still significant thrombus. Many would recommend checking and following d-dimers at that point and also work up for malignancy and hypercoagulable state with consideration to continue the anticoagulation.

## 2017-08-02 ENCOUNTER — Telehealth: Payer: Self-pay

## 2017-08-02 NOTE — Telephone Encounter (Signed)
Letter printed and signed. Placed in fax box

## 2017-08-02 NOTE — Telephone Encounter (Signed)
Letter has been faxed. Silus Lanzo,CMA

## 2017-08-02 NOTE — Telephone Encounter (Signed)
Patient called stated that during her visit it was discussed that provider would write a letter for an adjustable computer monitor. Patient is requesting that letter. Fax [phone]. Please advise me when letter is ready to be faxed to patient. Rhonda Cunningham,CMA

## 2017-08-03 ENCOUNTER — Ambulatory Visit: Payer: BLUE CROSS/BLUE SHIELD

## 2017-08-03 ENCOUNTER — Ambulatory Visit
Admission: RE | Admit: 2017-08-03 | Discharge: 2017-08-03 | Disposition: A | Discharge status: Home / Self Care | Payer: BLUE CROSS/BLUE SHIELD | Source: Ambulatory Visit | Attending: Obstetrics & Gynecology | Admitting: Obstetrics & Gynecology

## 2017-08-03 DIAGNOSIS — R928 Other abnormal and inconclusive findings on diagnostic imaging of breast: Secondary | ICD-10-CM

## 2017-08-08 ENCOUNTER — Telehealth: Payer: Self-pay

## 2017-08-08 DIAGNOSIS — Z309 Encounter for contraceptive management, unspecified: Secondary | ICD-10-CM

## 2017-08-08 MED ORDER — NORETHINDRONE 0.35 MG PO TABS: 1.0000 | ORAL_TABLET | Freq: Every day | ORAL | 11 refills | Status: DC

## 2017-08-08 NOTE — Telephone Encounter (Signed)
Pt was recently seen by Dr.Dove to discuss BC options and was told to go home and think about them and call us back and let us know what she wanted. She called today for Micronor and I sent it to her pharmacy.

## 2017-09-13 ENCOUNTER — Ambulatory Visit (INDEPENDENT_AMBULATORY_CARE_PROVIDER_SITE_OTHER): Payer: BLUE CROSS/BLUE SHIELD | Admitting: Physician Assistant

## 2017-09-13 ENCOUNTER — Encounter: Payer: Self-pay | Admitting: Physician Assistant

## 2017-09-13 VITALS — BP 118/79 | HR 92 | Temp 98.7°F | Resp 16

## 2017-09-13 DIAGNOSIS — J069 Acute upper respiratory infection, unspecified: Secondary | ICD-10-CM | POA: Diagnosis not present

## 2017-09-13 DIAGNOSIS — J302 Other seasonal allergic rhinitis: Secondary | ICD-10-CM | POA: Diagnosis not present

## 2017-09-13 MED ORDER — AZELASTINE-FLUTICASONE 137-50 MCG/ACT NA SUSP: 2.0000 | Freq: Two times a day (BID) | NASAL | 3 refills | Status: DC

## 2017-09-13 NOTE — Progress Notes (Signed)
HPI:                                                                Lisa Tran is a 41 y.o. female who presents to [facility]: [place] today for cough and congestion  URI   This is a new problem. The current episode started in the past 7 days. The problem has been unchanged. There has been no fever. Associated symptoms include chest pain (tightness), congestion, coughing (purulent sputum), rhinorrhea, a sore throat and wheezing. Pertinent negatives include no abdominal pain, ear pain, headaches, neck pain or rash. She has tried decongestant and inhaler use (Mucinex) for the symptoms. The treatment provided mild relief.     Past Medical History:  Diagnosis Date  . Allergy   . Asthma    Past Surgical History:  Procedure Laterality Date  . COSMETIC SURGERY    . GASTRIC BYPASS  2003  . KNEE SURGERY  2014   Social History   Tobacco Use  . Smoking status: Never Smoker  . Smokeless tobacco: Never Used  Substance Use Topics  . Alcohol use: Yes    Comment: occ   family history includes Asthma in her mother; Cancer in her maternal grandfather; Diabetes in her maternal aunt and maternal grandfather; Hypertension in her maternal aunt.  ROS: negative except as noted in the HPI  Medications: Current Outpatient Medications  Medication Sig Dispense Refill  . albuterol (PROVENTIL HFA;VENTOLIN HFA) 108 (90 Base) MCG/ACT inhaler Inhale 2 puffs into the lungs every 6 (six) hours as needed. For shortness of breath or wheezing 1 Inhaler 3  . AMBULATORY NON FORMULARY MEDICATION Knee-high, medium/grade II compression, graduated compression stockings. Large circ Apply to lower extremities. 1 each 0  . Azelastine-Fluticasone 137-50 MCG/ACT SUSP Place 2 sprays into the nose 2 (two) times daily. 1 Bottle 3  . cetirizine (ZYRTEC) 10 MG tablet Take 10 mg by mouth daily.    . Cholecalciferol (VITAMIN D3) 1000 units CAPS Take by mouth.    . diclofenac  (VOLTAREN) 75 MG EC tablet Take 1 tablet (75 mg total) by mouth 2 (two) times daily. 60 tablet 1  . montelukast (SINGULAIR) 10 MG tablet Take 1 tablet (10 mg total) by mouth at bedtime. 30 tablet 3  . Multiple Vitamin (MULTIVITAMIN WITH MINERALS) TABS Take 1 tablet by mouth daily.    . norethindrone (MICRONOR,CAMILA,ERRIN) 0.35 MG tablet Take 1 tablet (0.35 mg total) by mouth daily. 1 Package 11  . vitamin B-12 (CYANOCOBALAMIN) 100 MCG tablet Take 100 mcg by mouth daily.     No current facility-administered medications for this visit.    Allergies  Allergen Reactions  . Shellfish Allergy Anaphylaxis       Objective:  BP 118/79 (BP Location: Right Arm, Patient Position: Sitting, Cuff Size: Large)   Pulse 92   Temp 98.7 F (37.1 C)   Resp 16   SpO2 96%  Gen:  alert, not ill-appearing, no distress, appropriate for age 3: head normocephalic without obvious abnormality, conjunctiva and cornea clear, nasal mucosa edematous, there is rhinorrhea, oropharynx clear, no exudates, no sinus tenderness, neck supple, there is posterior cervical adenopathy, neck supple, trachea midline Pulm: Normal work of breathing, normal phonation, clear to auscultation bilaterally, no wheezes, rales  or rhonchi CV: Normal rate, regular rhythm, s1 and s2 distinct, no murmurs, clicks or rubs  Neuro: alert and oriented x 3, no tremor MSK: extremities atraumatic, normal gait and station Skin: intact, no rashes on exposed skin, no jaundice, no cyanosis   No results found for this or any previous visit (from the past 72 hour(s)). No results found.    Assessment and Plan: 41 y.o. female with   1. Acute URI of multiple sites - vital signs reviewed and normal, SpO2 96% on RA at rest, no evidence of acute asthma exacerbation. Continue symptomatic management with expectorant and decongestant. Refilled nasal steroid. Discussed symptoms warranting follow-up including fever and shortness of breath. - work note  provided to work remotely until Monday  2. Seasonal allergies - Azelastine-Fluticasone 137-50 MCG/ACT SUSP; Place 2 sprays into the nose 2 (two) times daily.  Dispense: 1 Bottle; Refill: 3   Patient education and anticipatory guidance given Patient agrees with treatment plan Follow-up as needed if symptoms worsen or fail to improve  Darlyne Russian PA-C

## 2017-09-13 NOTE — Patient Instructions (Addendum)
Upper Respiratory Infection, Adult Most upper respiratory infections (URIs) are a viral infection of the air passages leading to the lungs. A URI affects the nose, throat, and upper air passages. The most common type of URI is nasopharyngitis and is typically referred to as "the common cold." URIs run their course and usually go away on their own. Most of the time, a URI does not require medical attention, but sometimes a bacterial infection in the upper airways can follow a viral infection. This is called a secondary infection. Sinus and middle ear infections are common types of secondary upper respiratory infections. Bacterial pneumonia can also complicate a URI. A URI can worsen asthma and chronic obstructive pulmonary disease (COPD). Sometimes, these complications can require emergency medical care and may be life threatening. What are the causes? Almost all URIs are caused by viruses. A virus is a type of germ and can spread from one person to another. What increases the risk? You may be at risk for a URI if:  You smoke.  You have chronic heart or lung disease.  You have a weakened defense (immune) system.  You are very young or very old.  You have nasal allergies or asthma.  You work in crowded or poorly ventilated areas.  You work in health care facilities or schools.  What are the signs or symptoms? Symptoms typically develop 2-3 days after you come in contact with a cold virus. Most viral URIs last 7-10 days. However, viral URIs from the influenza virus (flu virus) can last 14-18 days and are typically more severe. Symptoms may include:  Runny or stuffy (congested) nose.  Sneezing.  Cough.  Sore throat.  Headache.  Fatigue.  Fever.  Loss of appetite.  Pain in your forehead, behind your eyes, and over your cheekbones (sinus pain).  Muscle aches.  How is this diagnosed? Your health care provider may diagnose a URI by:  Physical exam.  Tests to check that your  symptoms are not due to another condition such as: ? Strep throat. ? Sinusitis. ? Pneumonia. ? Asthma.  How is this treated? A URI goes away on its own with time. It cannot be cured with medicines, but medicines may be prescribed or recommended to relieve symptoms. Medicines may help:  Reduce your fever.  Reduce your cough.  Relieve nasal congestion.  Follow these instructions at home:  Take medicines only as directed by your health care provider.  Gargle warm saltwater or take cough drops to comfort your throat as directed by your health care provider.  Use a warm mist humidifier or inhale steam from a shower to increase air moisture. This may make it easier to breathe.  Drink enough fluid to keep your urine clear or pale yellow.  Eat soups and other clear broths and maintain good nutrition.  Rest as needed.  Return to work when your temperature has returned to normal or as your health care provider advises. You may need to stay home longer to avoid infecting others. You can also use a face mask and careful hand washing to prevent spread of the virus.  Increase the usage of your inhaler if you have asthma.  Do not use any tobacco products, including cigarettes, chewing tobacco, or electronic cigarettes. If you need help quitting, ask your health care provider. How is this prevented? The best way to protect yourself from getting a cold is to practice good hygiene.  Avoid oral or hand contact with people with cold symptoms.  Wash your   hands often if contact occurs.  There is no clear evidence that vitamin C, vitamin E, echinacea, or exercise reduces the chance of developing a cold. However, it is always recommended to get plenty of rest, exercise, and practice good nutrition. Contact a health care provider if:  You are getting worse rather than better.  Your symptoms are not controlled by medicine.  You have chills.  You have worsening shortness of breath.  You have  brown or red mucus.  You have yellow or brown nasal discharge.  You have pain in your face, especially when you bend forward.  You have a fever.  You have swollen neck glands.  You have pain while swallowing.  You have white areas in the back of your throat. Get help right away if:  You have severe or persistent: ? Headache. ? Ear pain. ? Sinus pain. ? Chest pain.  You have chronic lung disease and any of the following: ? Wheezing. ? Prolonged cough. ? Coughing up blood. ? A change in your usual mucus.  You have a stiff neck.  You have changes in your: ? Vision. ? Hearing. ? Thinking. ? Mood. This information is not intended to replace advice given to you by your health care provider. Make sure you discuss any questions you have with your health care provider. Document Released: 03/21/2001 Document Revised: 05/28/2016 Document Reviewed: 12/31/2013 Elsevier Interactive Patient Education  2017 Elsevier Inc.  

## 2017-09-20 ENCOUNTER — Telehealth: Payer: Self-pay | Admitting: *Deleted

## 2017-09-20 NOTE — Telephone Encounter (Signed)
Pre Authorization sent to cover my meds.[id number] Outcome  Approvedtoday  CaseId:[id number];Status:Approved;Review Type:Prior Auth;Coverage Start Date:08/21/2017;Coverage End Date:09/20/2018;   Patient and pharmacy notified

## 2017-10-03 ENCOUNTER — Ambulatory Visit (INDEPENDENT_AMBULATORY_CARE_PROVIDER_SITE_OTHER): Payer: BLUE CROSS/BLUE SHIELD

## 2017-10-03 ENCOUNTER — Ambulatory Visit (INDEPENDENT_AMBULATORY_CARE_PROVIDER_SITE_OTHER): Payer: BLUE CROSS/BLUE SHIELD | Admitting: Obstetrics & Gynecology

## 2017-10-03 DIAGNOSIS — N921 Excessive and frequent menstruation with irregular cycle: Secondary | ICD-10-CM | POA: Diagnosis not present

## 2017-10-03 DIAGNOSIS — D250 Submucous leiomyoma of uterus: Secondary | ICD-10-CM | POA: Diagnosis not present

## 2017-10-03 DIAGNOSIS — D259 Leiomyoma of uterus, unspecified: Secondary | ICD-10-CM | POA: Diagnosis not present

## 2017-10-03 LAB — CBC
HCT: 33.2 % — ABNORMAL LOW (ref 35.0–45.0)
Hemoglobin: 10.3 g/dL — ABNORMAL LOW (ref 11.7–15.5)
MCH: 23.2 pg — ABNORMAL LOW (ref 27.0–33.0)
MCHC: 31 g/dL — ABNORMAL LOW (ref 32.0–36.0)
MCV: 74.8 fL — ABNORMAL LOW (ref 80.0–100.0)
MPV: 8.8 fL (ref 7.5–12.5)
Platelets: 530 10*3/uL — ABNORMAL HIGH (ref 140–400)
RBC: 4.44 10*6/uL (ref 3.80–5.10)
RDW: 15.8 % — ABNORMAL HIGH (ref 11.0–15.0)
WBC: 4 10*3/uL (ref 3.8–10.8)

## 2017-10-03 LAB — TSH: TSH: 1.48 mIU/L

## 2017-10-03 NOTE — Progress Notes (Signed)
Patient ID: Lisa Tran, female   DOB: 1975/12/06, 41 y.o.   MRN: [id number]  Chief Complaint  Patient presents with  . Menorrhagia    HPI Lisa Tran is a 41 y.o. female. Married P0 here to discuss her heavy periods/bleeding. She was seen here in October. At that time she had been advised to stop her OCPs due to a SUPERFICIAL clot. I told her at that time that she could still take regular OCPs. However, she opted to try POPs. She has been bleeding since 09/12/17. She doubled up on her dose for 4 days. She has a history of anemia.She reports that prior to OCP use, she had very heavy periods. However, she has been anemic for years, even on OCPs HPI  Past Medical History:  Diagnosis Date  . Allergy   . Asthma     Past Surgical History:  Procedure Laterality Date  . COSMETIC SURGERY    . GASTRIC BYPASS  2003  . KNEE SURGERY  2014    Family History  Problem Relation Age of Onset  . Diabetes Maternal Aunt   . Diabetes Maternal Grandfather   . Cancer Maternal Grandfather        prostate  . Hypertension Maternal Aunt   . Asthma Mother     Social History Social History   Tobacco Use  . Smoking status: Never Smoker  . Smokeless tobacco: Never Used  Substance Use Topics  . Alcohol use: Yes    Comment: occ  . Drug use: No    Allergies  Allergen Reactions  . Shellfish Allergy Anaphylaxis    Current Outpatient Medications  Medication Sig Dispense Refill  . albuterol (PROVENTIL HFA;VENTOLIN HFA) 108 (90 Base) MCG/ACT inhaler Inhale 2 puffs into the lungs every 6 (six) hours as needed. For shortness of breath or wheezing 1 Inhaler 3  . AMBULATORY NON FORMULARY MEDICATION Knee-high, medium/grade II compression, graduated compression stockings. Large circ Apply to lower extremities. 1 each 0  . Azelastine-Fluticasone 137-50 MCG/ACT SUSP Place 2 sprays into the nose 2 (two) times daily. 1 Bottle 3  . cetirizine (ZYRTEC) 10 MG tablet Take 10 mg by mouth daily.    .  Cholecalciferol (VITAMIN D3) 1000 units CAPS Take by mouth.    . diclofenac (VOLTAREN) 75 MG EC tablet Take 1 tablet (75 mg total) by mouth 2 (two) times daily. 60 tablet 1  . montelukast (SINGULAIR) 10 MG tablet Take 1 tablet (10 mg total) by mouth at bedtime. 30 tablet 3  . Multiple Vitamin (MULTIVITAMIN WITH MINERALS) TABS Take 1 tablet by mouth daily.    . norethindrone (MICRONOR,CAMILA,ERRIN) 0.35 MG tablet Take 1 tablet (0.35 mg total) by mouth daily. 1 Package 11  . vitamin B-12 (CYANOCOBALAMIN) 100 MCG tablet Take 100 mcg by mouth daily.     No current facility-administered medications for this visit.     Review of Systems Review of Systems  There were no vitals taken for this visit.  Physical Exam Physical Exam Breathing, conversing, and ambulating normally  Data Reviewed Results for IANNA, Lisa Tran (MRN [id number]) as of 10/03/2017 10:37  Ref. Range 04/24/2017 08:34  WBC Latest Ref Range: 3.8 - 10.8 K/uL 5.6  RBC Latest Ref Range: 3.80 - 5.10 MIL/uL 4.13  Hemoglobin Latest Ref Range: 11.7 - 15.5 g/dL 9.0 (L)  HCT Latest Ref Range: 35.0 - 45.0 % 29.8 (L)  MCV Latest Ref Range: 80.0 - 100.0 fL 72.2 (L)  MCH Latest Ref Range: 27.0 - 33.0 pg 21.8 (L)  Assessment    Menorrhagia, anemia    Plan  check CBC, TSH, and gyn u/s Encourage an IUD  rec restart OCPs (she has them at home) Return 1 week        Emily Filbert 10/03/2017, 10:40 AM

## 2017-10-15 ENCOUNTER — Encounter: Payer: Self-pay | Admitting: Obstetrics & Gynecology

## 2017-10-15 ENCOUNTER — Ambulatory Visit: Payer: BLUE CROSS/BLUE SHIELD | Admitting: Obstetrics & Gynecology

## 2017-10-15 VITALS — BP 125/70 | HR 95 | Resp 16 | Ht 66.0 in | Wt 311.0 lb

## 2017-10-15 DIAGNOSIS — N921 Excessive and frequent menstruation with irregular cycle: Secondary | ICD-10-CM

## 2017-10-15 DIAGNOSIS — D219 Benign neoplasm of connective and other soft tissue, unspecified: Secondary | ICD-10-CM | POA: Diagnosis not present

## 2017-10-15 MED ORDER — BALZIVA 0.4-35 MG-MCG PO TABS: 1.0000 | ORAL_TABLET | Freq: Every day | ORAL | 12 refills | Status: DC

## 2017-10-15 NOTE — Progress Notes (Signed)
   Subjective:    Patient ID: Lisa Tran, female    DOB: May 04, 1976, 42 y.o.   MRN: [id number]  HPI 42 yo married P0 here to discuss her u/s findings. She has a h/o DUB, restarted her OCPs and the bleeding is much better/stopped in 2 days.    Review of Systems She is not anxious to get pregnant.     Objective:   Physical Exam  IMPRESSION: Uterine leiomyomata, with a 3.9 cm diameter posterior mid uterine leiomyoma extending submucosal.       Assessment & Plan:  DUB- from fibroids. Her endometrium looked fine. Info on fibroids given Rec repeat u/s in a year or so.

## 2017-10-15 NOTE — Patient Instructions (Signed)
Uterine Fibroids Uterine fibroids are tissue masses (tumors) that can develop in the womb (uterus). They are also called leiomyomas. This type of tumor is not cancerous (benign) and does not spread to other parts of the body outside of the pelvic area, which is between the hip bones. Occasionally, fibroids may develop in the fallopian tubes, in the cervix, or on the support structures (ligaments) that surround the uterus. You can have one or many fibroids. Fibroids can vary in size, weight, and where they grow in the uterus. Some can become quite large. Most fibroids do not require medical treatment. What are the causes? A fibroid can develop when a single uterine cell keeps growing (replicating). Most cells in the human body have a control mechanism that keeps them from replicating without control. What are the signs or symptoms? Symptoms may include:  Heavy bleeding during your period.  Bleeding or spotting between periods.  Pelvic pain and pressure.  Bladder problems, such as needing to urinate more often (urinary frequency) or urgently.  Inability to reproduce offspring (infertility).  Miscarriages.  How is this diagnosed? Uterine fibroids are diagnosed through a physical exam. Your health care provider may feel the lumpy tumors during a pelvic exam. Ultrasonography and an MRI may be done to determine the size, location, and number of fibroids. How is this treated? Treatment may include:  Watchful waiting. This involves getting the fibroid checked by your health care provider to see if it grows or shrinks. Follow your health care provider's recommendations for how often to have this checked.  Hormone medicines. These can be taken by mouth or given through an intrauterine device (IUD).  Surgery. ? Removing the fibroids (myomectomy) or the uterus (hysterectomy). ? Removing blood supply to the fibroids (uterine artery embolization).  If fibroids interfere with your fertility and you  want to become pregnant, your health care provider may recommend having the fibroids removed. Follow these instructions at home:  Keep all follow-up visits as directed by your health care provider. This is important.  Take over-the-counter and prescription medicines only as told by your health care provider. ? If you were prescribed a hormone treatment, take the hormone medicines exactly as directed.  Ask your health care provider about taking iron pills and increasing the amount of dark green, leafy vegetables in your diet. These actions can help to boost your blood iron levels, which may be affected by heavy menstrual bleeding.  Pay close attention to your period and tell your health care provider about any changes, such as: ? Increased blood flow that requires you to use more pads or tampons than usual per month. ? A change in the number of days that your period lasts per month. ? A change in symptoms that are associated with your period, such as abdominal cramping or back pain. Contact a health care provider if:  You have pelvic pain, back pain, or abdominal cramps that cannot be controlled with medicines.  You have an increase in bleeding between and during periods.  You soak tampons or pads in a half hour or less.  You feel lightheaded, extra tired, or weak. Get help right away if:  You faint.  You have a sudden increase in pelvic pain. This information is not intended to replace advice given to you by your health care provider. Make sure you discuss any questions you have with your health care provider. Document Released: 09/22/2000 Document Revised: 05/25/2016 Document Reviewed: 03/24/2014 Elsevier Interactive Patient Education  2018 Elsevier Inc.  

## 2017-10-17 ENCOUNTER — Other Ambulatory Visit: Payer: Self-pay | Admitting: *Deleted

## 2017-10-29 ENCOUNTER — Encounter: Payer: Self-pay | Admitting: Obstetrics & Gynecology

## 2017-11-14 ENCOUNTER — Encounter: Payer: Self-pay | Admitting: Osteopathic Medicine

## 2017-11-14 ENCOUNTER — Other Ambulatory Visit: Payer: Self-pay | Admitting: Osteopathic Medicine

## 2017-11-14 DIAGNOSIS — J302 Other seasonal allergic rhinitis: Secondary | ICD-10-CM

## 2017-11-21 ENCOUNTER — Ambulatory Visit: Payer: BLUE CROSS/BLUE SHIELD | Admitting: Obstetrics & Gynecology

## 2017-12-12 ENCOUNTER — Ambulatory Visit: Payer: BLUE CROSS/BLUE SHIELD | Admitting: Obstetrics & Gynecology

## 2017-12-12 ENCOUNTER — Encounter: Payer: Self-pay | Admitting: Obstetrics & Gynecology

## 2017-12-12 VITALS — BP 123/78 | HR 82 | Resp 16 | Ht 66.0 in | Wt 311.0 lb

## 2017-12-12 DIAGNOSIS — D219 Benign neoplasm of connective and other soft tissue, unspecified: Secondary | ICD-10-CM

## 2017-12-12 DIAGNOSIS — D500 Iron deficiency anemia secondary to blood loss (chronic): Secondary | ICD-10-CM | POA: Diagnosis not present

## 2017-12-12 DIAGNOSIS — N921 Excessive and frequent menstruation with irregular cycle: Secondary | ICD-10-CM

## 2017-12-12 MED ORDER — MISOPROSTOL 200 MCG PO TABS: ORAL_TABLET | ORAL | 0 refills | Status: DC

## 2017-12-12 NOTE — Progress Notes (Signed)
Patient ID: Lisa Tran, female   DOB: 11-Nov-1975, 42 y.o.   MRN: [id number]  Chief Complaint  Patient presents with  . Discuss Hysterectomy    HPI Lisa Tran is a 42 y.o. female.  HPI Lisa Tran is a 42 y.o. female. Married P0 here to discuss her heavy periods/bleeding. She was seen here in October. At that time she had been advised to stop her OCPs due to a SUPERFICIAL clot. I told her at that time that she could still take regular OCPs. However, she opted to try POPs. She has been bleeding since 09/12/17. She doubled up on her dose for 4 days. She has a history of anemia.She reports that prior to OCP use, she had very heavy periods. However, she has been anemic for years, even on OCPs. She did agree to restart regular OCPs and has continued to have very heavy periods. An u/s showed several fibroids, including a submucosal one.   Past Medical History:  Diagnosis Date  . Allergy   . Asthma     Past Surgical History:  Procedure Laterality Date  . COSMETIC SURGERY    . GASTRIC BYPASS  2003  . KNEE SURGERY  2014    Family History  Problem Relation Age of Onset  . Diabetes Maternal Aunt   . Diabetes Maternal Grandfather   . Cancer Maternal Grandfather        prostate  . Hypertension Maternal Aunt   . Asthma Mother     Social History Social History   Tobacco Use  . Smoking status: Never Smoker  . Smokeless tobacco: Never Used  Substance Use Topics  . Alcohol use: Yes    Comment: occ  . Drug use: No    Allergies  Allergen Reactions  . Shellfish Allergy Anaphylaxis    Current Outpatient Medications  Medication Sig Dispense Refill  . albuterol (PROVENTIL HFA;VENTOLIN HFA) 108 (90 Base) MCG/ACT inhaler Inhale 2 puffs into the lungs every 6 (six) hours as needed. For shortness of breath or wheezing 1 Inhaler 3  . AMBULATORY NON FORMULARY MEDICATION Knee-high, medium/grade II compression, graduated compression stockings. Large circ Apply to lower  extremities. 1 each 0  . Azelastine-Fluticasone 137-50 MCG/ACT SUSP Place 2 sprays into the nose 2 (two) times daily. 1 Bottle 3  . BALZIVA 0.4-35 MG-MCG tablet Take 1 tablet by mouth daily. 1 Package 12  . cetirizine (ZYRTEC) 10 MG tablet Take 10 mg by mouth daily.    . Cholecalciferol (VITAMIN D3) 1000 units CAPS Take by mouth.    . ferrous fumarate-iron polysaccharide complex (TANDEM) 162-115.2 MG CAPS capsule Take by mouth.    . montelukast (SINGULAIR) 10 MG tablet TAKE 1 TABLET DAILY 90 tablet 3  . Multiple Vitamin (MULTIVITAMIN WITH MINERALS) TABS Take 1 tablet by mouth daily.    . vitamin B-12 (CYANOCOBALAMIN) 100 MCG tablet Take 100 mcg by mouth daily.    . misoprostol (CYTOTEC) 200 MCG tablet Take 3 pills by mouth the night before biopsy. 3 tablet 0   No current facility-administered medications for this visit.     Review of Systems Review of Systems  Blood pressure 123/78, pulse 82, resp. rate 16, height 5\' 6"  (1.676 m), weight (!) 311 lb (141.1 kg).  Physical Exam Physical Exam  Data Reviewed IMPRESSION: Uterine leiomyomata, with a 3.9 cm diameter posterior mid uterine leiomyoma extending submucosal.  Otherwise negative exam.   Electronically Signed   By: Lavonia Dana M.D.   On: 10/03/2017 11:53  Assessment  Fibroid (including a submucosal one), DUB, anemia- plan for [facility] at next visit along with Mirena insertion  She will take cytotec the night prior. We also discussed endometrial ablation and hysterectomy  Plan    See above       Beverly 12/12/2017, 10:02 AM

## 2017-12-18 DIAGNOSIS — L700 Acne vulgaris: Secondary | ICD-10-CM | POA: Diagnosis not present

## 2017-12-26 ENCOUNTER — Ambulatory Visit: Payer: BLUE CROSS/BLUE SHIELD | Admitting: Obstetrics & Gynecology

## 2017-12-26 ENCOUNTER — Encounter: Payer: Self-pay | Admitting: *Deleted

## 2017-12-26 ENCOUNTER — Encounter: Payer: Self-pay | Admitting: Obstetrics & Gynecology

## 2017-12-26 VITALS — BP 109/75 | HR 81 | Resp 16 | Ht 66.0 in | Wt 306.0 lb

## 2017-12-26 DIAGNOSIS — N921 Excessive and frequent menstruation with irregular cycle: Secondary | ICD-10-CM

## 2017-12-26 DIAGNOSIS — N939 Abnormal uterine and vaginal bleeding, unspecified: Secondary | ICD-10-CM | POA: Diagnosis not present

## 2017-12-26 DIAGNOSIS — Z3202 Encounter for pregnancy test, result negative: Secondary | ICD-10-CM | POA: Diagnosis not present

## 2017-12-26 DIAGNOSIS — Z30430 Encounter for insertion of intrauterine contraceptive device: Secondary | ICD-10-CM | POA: Diagnosis not present

## 2017-12-26 LAB — POCT URINE PREGNANCY: Preg Test, Ur: NEGATIVE

## 2017-12-26 MED ORDER — LEVONORGESTREL 20 MCG/24HR IU IUD: INTRAUTERINE_SYSTEM | Freq: Once | INTRAUTERINE | Status: DC

## 2017-12-26 MED ORDER — LEVONORGESTREL 20 MCG/24HR IU IUD
INTRAUTERINE_SYSTEM | Freq: Once | INTRAUTERINE | Status: AC
  Administered 2017-12-26: 11:00:00 via INTRAUTERINE

## 2017-12-26 NOTE — Addendum Note (Signed)
Addended by: Lyndal Rainbow on: 12/26/2017 10:55 AM   Modules accepted: Orders

## 2017-12-26 NOTE — Progress Notes (Signed)
   Subjective:    Patient ID: Lisa Tran, female    DOB: [birth year], 42 y.o.   MRN: [id number]  HPI 42 yo married P0 here for [facility] and IUD insertion due to menorrhagia. She took cytotec last night.   Review of Systems     Component 1mo ago  Adequacy Satisfactory for evaluation endocervical/transformation zone component PRESENT.   Diagnosis NEGATIVE FOR INTRAEPITHELIAL LESIONS OR MALIGNANCY.   HPV NOT DETECTED   Comment: Normal Reference Range - NOT Detected  Material Submitted CervicoVaginal Pap [ThinPrep Imaged]   CYTOLOGY - PAP PAP RESULT   Resulting Agency [facility]      Specimen Collected: 04/12/17 00:00 Last Resulted: 04/17/17 00:00      Objective:   Physical Exam Breathing, conversing, and ambulating normally  UPT negative, consent signed, time out done Cervix prepped with betadine and grasped with a single tooth tenaculum Uterus sounded to 9 cm Pipelle used for 2 passes with a moderate amount of tissue obtained.  Mirena was easily placed according to manufacture recommdations and the strings were cut to 3-4 cm. Uterus sounded to 9 cm. She tolerated the procedure well.      Assessment & Plan:  Menorrhagia- trial of Mirena Await pathology from [facility]

## 2018-01-21 ENCOUNTER — Ambulatory Visit: Payer: BLUE CROSS/BLUE SHIELD | Admitting: Obstetrics & Gynecology

## 2018-01-21 ENCOUNTER — Encounter: Payer: Self-pay | Admitting: Obstetrics & Gynecology

## 2018-01-21 VITALS — BP 120/78 | HR 95 | Wt 299.0 lb

## 2018-01-21 DIAGNOSIS — Z30431 Encounter for routine checking of intrauterine contraceptive device: Secondary | ICD-10-CM | POA: Diagnosis not present

## 2018-01-21 NOTE — Progress Notes (Signed)
Pt has no complaints with IUD- string check

## 2018-01-21 NOTE — Progress Notes (Signed)
   Subjective:    Patient ID: Lisa Tran, female    DOB: [birth year], 42 y.o.   MRN: [id number]  HPI 42 yo married P0 here for a string check. She is thrilled with the Mirena. Her bleeding (for the last month) stopped completely 6 days after the insertion. Her husband cannot feel the strings.   Review of Systems     Objective:   Physical Exam  Breathing, conversing, and ambulating normally Well nourished, well hydrated Black female, no apparent distress Strings not visible with speculum Bedside u/s confirms correct placement       Assessment & Plan:  Menorrhagia- Mirena Come back in a year/prn sooner

## 2018-01-30 ENCOUNTER — Institutional Professional Consult (permissible substitution): Payer: BLUE CROSS/BLUE SHIELD | Admitting: Obstetrics & Gynecology

## 2018-02-15 ENCOUNTER — Ambulatory Visit: Payer: BLUE CROSS/BLUE SHIELD | Admitting: Obstetrics & Gynecology

## 2018-02-15 ENCOUNTER — Encounter: Payer: Self-pay | Admitting: Obstetrics & Gynecology

## 2018-02-15 VITALS — BP 100/52 | HR 80 | Ht 66.0 in

## 2018-02-15 DIAGNOSIS — R102 Pelvic and perineal pain: Secondary | ICD-10-CM | POA: Diagnosis not present

## 2018-02-15 DIAGNOSIS — N939 Abnormal uterine and vaginal bleeding, unspecified: Secondary | ICD-10-CM

## 2018-02-15 DIAGNOSIS — T8332XA Displacement of intrauterine contraceptive device, initial encounter: Secondary | ICD-10-CM

## 2018-02-15 NOTE — Progress Notes (Signed)
Was seen by Dr. Hulan Fray and had Mirena inserted on 12-26-17. Kathrene Alu RN

## 2018-02-15 NOTE — Progress Notes (Signed)
History:  42 y.o. G1P0010 here today for eval of some 'body changes'. Last year pt went to [place]. She got a 'blood clot' and was prescribed anticoagulants that she did not take because her primary dx'd superficial thrombophlebitis. Her primary physician rec to stop OCPs. Pt was told by Dr. Hulan Fray that she could stay on OCPs but, was changed to POPs. She took them but, had excessive bleeding the entire time.  Pt was also dx'd with anemia. Pt reports that at that time, she was placed on back on OCPs. Pt also had a flare of acne while on POPs. She was offered a LnIUD vs a hyst. She also has fibroids but, thought that a hyst was too extreme. She did have the [place] placed in Mar 2019. The bleeding resolved.  She is worried now about the placement of the IUD. She does believe that she can feel it. She has cramping especially with intercourse.   She is concerned about the IUD. She did have a IUD check where the strings weren't noted but, an US showed that the IUD was in the correct location.      Her skin has improved.    The following portions of the patient's history were reviewed and updated as appropriate: allergies, current medications, past family history, past medical history, past social history, past surgical history and problem list.  Review of Systems:  Pertinent items are noted in HPI.    Objective:  Physical Exam Blood pressure (!) 100/52, pulse 80, height 5\' 6"  (1.676 m), last menstrual period 01/18/2018.  CONSTITUTIONAL: Well-developed, well-nourished female in no acute distress.  HENT:  Normocephalic, atraumatic EYES: Conjunctivae and EOM are normal. No scleral icterus.  NECK: Normal range of motion SKIN: Skin is warm and dry. No rash noted. Not diaphoretic.No pallor. [place]: Alert and oriented to person, place, and time. Normal coordination.  Abd: Soft, nontender and nondistended; obese Pelvic: Normal appearing external genitalia;  TVUS was performed: the IUD was noted at the  fundus but, it appear to be peircing the fundus. Part of the IUD was in the endometrial cavity.    Labs and Imaging 10/03/2017 CLINICAL DATA:  Menorrhagia with irregular cycle, continuous vaginal bleeding since 09/12/2017, switched birth control 2 months ago, heavy bleeding in past week  EXAM: ULTRASOUND PELVIS TRANSVAGINAL  TECHNIQUE: Transvaginal ultrasound examination of the pelvis was performed including evaluation of the uterus, ovaries, adnexal regions, and pelvic cul-de-sac.  COMPARISON:  None  FINDINGS: Uterus  Measurements: 9.3 x 6.8 x 6.0 cm. Heterogeneous myometrial echogenicity with multiple nodular foci likely representing leiomyomata. Posterior mid uterine mass, submucosal, 3.9 x 3.3 x 3.9 cm. Subserosal anterior fundal mass on RIGHT 3.2 x 2.9 x 4.3 cm.  Endometrium  Thickness: Suboptimally visualized at mid upper uterus, adequately visualized portion measures 10 mm thick. No definite endometrial fluid or focal abnormality  Right ovary  Measurements: 2.2 x 1.4 x 1.7 cm. Normal morphology without mass  Left ovary  Measurements: 2.4 x 1.4 x 1.6 cm. Normal morphology without mass  Other findings:  No free pelvic fluid or adnexal masses  IMPRESSION: Uterine leiomyomata, with a 3.9 cm diameter posterior mid uterine leiomyoma extending submucosal.  Otherwise negative exam.  Assessment & Plan:  AUB- improved with LnIUD  Pelvic pain/pressure- concern over location of LnIUD. The exam today also suggests that the location of the IUD could be perforating the uterus. Will obtain an official US  I reviewed the superficial thrombophlebitis and confirmed that OCPs are not necessarily contraindicated  however, I agree with the plan for the [place]. Once we confirm location.  Total face-to-face time with patient was 25 min.  Greater than 50% was spent in counseling and coordination of care with the patient.   Keona Sheffler L. Harraway-Smith, M.D., Cherlynn June

## 2018-02-18 ENCOUNTER — Encounter: Payer: Self-pay | Admitting: Obstetrics & Gynecology

## 2018-02-19 ENCOUNTER — Encounter: Payer: Self-pay | Admitting: Obstetrics & Gynecology

## 2018-02-19 ENCOUNTER — Ambulatory Visit (HOSPITAL_BASED_OUTPATIENT_CLINIC_OR_DEPARTMENT_OTHER): Payer: BLUE CROSS/BLUE SHIELD

## 2018-02-19 ENCOUNTER — Encounter (HOSPITAL_BASED_OUTPATIENT_CLINIC_OR_DEPARTMENT_OTHER): Payer: Self-pay

## 2018-07-04 ENCOUNTER — Ambulatory Visit: Payer: BLUE CROSS/BLUE SHIELD | Admitting: Obstetrics & Gynecology

## 2018-09-17 ENCOUNTER — Encounter: Payer: Self-pay | Admitting: Osteopathic Medicine

## 2018-09-17 ENCOUNTER — Ambulatory Visit (INDEPENDENT_AMBULATORY_CARE_PROVIDER_SITE_OTHER): Payer: BLUE CROSS/BLUE SHIELD | Admitting: Osteopathic Medicine

## 2018-09-17 DIAGNOSIS — E611 Iron deficiency: Secondary | ICD-10-CM

## 2018-09-17 DIAGNOSIS — D219 Benign neoplasm of connective and other soft tissue, unspecified: Secondary | ICD-10-CM

## 2018-09-17 DIAGNOSIS — Z1321 Encounter for screening for nutritional disorder: Secondary | ICD-10-CM

## 2018-09-17 DIAGNOSIS — E538 Deficiency of other specified B group vitamins: Secondary | ICD-10-CM

## 2018-09-17 DIAGNOSIS — E559 Vitamin D deficiency, unspecified: Secondary | ICD-10-CM

## 2018-09-17 DIAGNOSIS — Z23 Encounter for immunization: Secondary | ICD-10-CM | POA: Diagnosis not present

## 2018-09-17 NOTE — Patient Instructions (Addendum)
I'll shoot a message [facility] to [facility] and see if they recommend a therapist.  Will get labs fasting at your convenience!  Consider follow up with Dr Hulan Fray to discuss options to treat bleeding - ablation or hysterectomy? Will hold off for now on hormonal levels, since I doubt perimenopause is causing your symptoms.

## 2018-09-17 NOTE — Progress Notes (Addendum)
HPI: Lisa Tran is a 42 y.o. female who  has a past medical history of Allergy and Asthma.  she presents to [facility] today, 09/17/18,  for chief complaint of:  STRESS, WEIGHT  Stress and worry, she is concerned about associated weight gain. She has lost some weight this year but slowly, 13 lbs since 12/2017.   Concerned might have to do with hormones or early menopause. She is still having fairly regular periods on Mirena.   She'd like to talk to a therapist about stress and how it relates to her weight. Not interested in medications.   History of vitamin D and iron deficiency, would like these checked.   Wt Readings from Last 3 Encounters:  09/17/18 293 lb 11.2 oz (133.2 kg)  01/21/18 299 lb (135.6 kg)  12/26/17 (!) 306 lb (138.8 kg)      GAD 7 : Generalized Anxiety Score 09/17/2018  Nervous, Anxious, on Edge 1  Control/stop worrying 2  Worry too much - different things 1  Trouble relaxing 0  Restless 0  Easily annoyed or irritable 0  Afraid - awful might happen 0  Total GAD 7 Score 4  Anxiety Difficulty Somewhat difficult     Depression screen [facility] 2/9 09/17/2018 07/16/2017  Decreased Interest 0 0  Down, Depressed, Hopeless 0 0  PHQ - 2 Score 0 0  Altered sleeping 0 -  Tired, decreased energy 1 -  Change in appetite 0 -  Feeling bad or failure about yourself  0 -  Trouble concentrating 0 -  Moving slowly or fidgety/restless 0 -  Suicidal thoughts 0 -  PHQ-9 Score 1 -  Difficult doing work/chores Not difficult at all -         At today's visit... Past medical history, surgical history, and family history reviewed and updated as needed.  Current medication list and allergy/intolerance information reviewed and updated as needed. (See remainder of HPI, ROS, Phys Exam below)  BP 122/74 (BP Location: Right Arm, Patient Position: Sitting, Cuff Size: Large)   Pulse 84   Temp 97.6 F (36.4 C) (Oral)   Wt 293 lb 11.2  oz (133.2 kg)   BMI 47.40 kg/m        ASSESSMENT/PLAN: The primary encounter diagnosis was Morbid obesity, unspecified obesity type ([place]). Diagnoses of Need for Tdap vaccination, Encounter for vitamin deficiency screening, Fibroids, Iron deficiency, B12 deficiency, and Vitamin D deficiency were also pertinent to this visit.   Orders Placed This Encounter  Procedures  . Tdap vaccine greater than or equal to 7yo IM  . CBC  . COMPLETE METABOLIC PANEL WITH GFR  . Lipid panel  . TSH  . VITAMIN D 25 Hydroxy (Vit-D Deficiency, Fractures)  . Vitamin B12  . Fe+TIBC+Fer     Discussed weight loss strategies, she's pretty knowledgeable and has been doing calorie logging, etc. No formal exercise program, this might help.    Patient Instructions  I'll [facility] to [facility] and see if they recommend a therapist.  Will get labs fasting at your convenience!  Consider follow up with Dr Hulan Fray to discuss options to treat bleeding - ablation or hysterectomy? Will hold off for now on hormonal levels, since I doubt perimenopause is causing your symptoms.        Follow-up plan: Return in about 3 months (around 12/17/2018) for weight and mood recheck .                             ############################################ ############################################ ############################################ ############################################  Current Meds  Medication Sig  . albuterol (PROVENTIL HFA;VENTOLIN HFA) 108 (90 Base) MCG/ACT inhaler Inhale 2 puffs into the lungs every 6 (six) hours as needed. For shortness of breath or wheezing  . AMBULATORY NON FORMULARY MEDICATION Knee-high, medium/grade II compression, graduated compression stockings. Large circ Apply to lower extremities.  . Cholecalciferol (VITAMIN D3) 1000 units CAPS Take by mouth.  . ferrous fumarate-iron polysaccharide complex (TANDEM)  162-115.2 MG CAPS capsule Take by mouth.  . Multiple Vitamin (MULTIVITAMIN WITH MINERALS) TABS Take 1 tablet by mouth daily.  . vitamin B-12 (CYANOCOBALAMIN) 100 MCG tablet Take 100 mcg by mouth daily.   Current Facility-Administered Medications for the 09/17/18 encounter (Office Visit) with Emeterio Reeve, DO  Medication  . levonorgestrel (MIRENA) 20 MCG/24HR IUD    Allergies  Allergen Reactions  . Shellfish Allergy Anaphylaxis       Review of Systems:  Constitutional: No recent illness  HEENT: No  headache, no vision change  Cardiac: No  chest pain, No  pressure, No palpitations  Respiratory:  No  shortness of breath. No  Cough  Neurologic: No  weakness, No  Dizziness  Psychiatric: No  concerns with depression, +concerns with anxiety  Exam:  BP 122/74 (BP Location: Right Arm, Patient Position: Sitting, Cuff Size: Large)   Pulse 84   Temp 97.6 F (36.4 C) (Oral)   Wt 293 lb 11.2 oz (133.2 kg)   BMI 47.40 kg/m   Constitutional: VS see above. General Appearance: alert, well-developed, well-nourished, NAD  Eyes: Normal lids and conjunctive, non-icteric sclera  Ears, Nose, Mouth, Throat: MMM, Normal external inspection ears/nares/mouth/lips/gums.  Neck: No masses, trachea midline.   Respiratory: Normal respiratory effort.   Musculoskeletal: Gait normal. Symmetric and independent movement of all extremities  Abdominal: non-tender, non-distended, no appreciable organomegaly, neg Murphy's, BS [id number]  Neurological: Normal balance/coordination. No tremor.  Skin: warm, dry, intact.   Psychiatric: Normal judgment/insight. Normal mood and affect. Oriented x3.       Visit summary with medication list and pertinent instructions was printed for patient to review, patient was advised to alert [place] if any updates are needed. All questions at time of visit were answered - patient instructed to contact office with any additional concerns. ER/RTC precautions were reviewed  with the patient and understanding verbalized.   Note: Total time spent 25 minutes, greater than 50% of the visit was spent face-to-face counseling and coordinating care for the following: The primary encounter diagnosis was Morbid obesity, unspecified obesity type ([place]). Diagnoses of Need for Tdap vaccination, Encounter for vitamin deficiency screening, Fibroids, Iron deficiency, B12 deficiency, and Vitamin D deficiency were also pertinent to this visit.Marland Kitchen  Please note: voice recognition software was used to produce this document, and typos may escape review. Please contact Dr. Sheppard Coil for any needed clarifications.    Follow up plan: Return in about 3 months (around 12/17/2018) for weight and mood recheck .

## 2018-09-20 ENCOUNTER — Ambulatory Visit (INDEPENDENT_AMBULATORY_CARE_PROVIDER_SITE_OTHER): Payer: BLUE CROSS/BLUE SHIELD

## 2018-09-20 ENCOUNTER — Other Ambulatory Visit: Payer: Self-pay | Admitting: Osteopathic Medicine

## 2018-09-20 DIAGNOSIS — Z1231 Encounter for screening mammogram for malignant neoplasm of breast: Secondary | ICD-10-CM

## 2018-09-21 LAB — COMPLETE METABOLIC PANEL WITH GFR
AG Ratio: 1.4 (calc) (ref 1.0–2.5)
ALT: 18 U/L (ref 6–29)
AST: 24 U/L (ref 10–30)
Albumin: 4.1 g/dL (ref 3.6–5.1)
Alkaline phosphatase (APISO): 70 U/L (ref 33–115)
BUN: 9 mg/dL (ref 7–25)
CO2: 27 mmol/L (ref 20–32)
Calcium: 9.1 mg/dL (ref 8.6–10.2)
Chloride: 102 mmol/L (ref 98–110)
Creat: 0.79 mg/dL (ref 0.50–1.10)
GFR, Est African American: 106 mL/min/{1.73_m2} (ref >=60)
GFR, Est Non African American: 91 mL/min/{1.73_m2} (ref >=60)
Globulin: 2.9 g/dL (calc) (ref 1.9–3.7)
Glucose, Bld: 91 mg/dL (ref 65–99)
Potassium: 4.2 mmol/L (ref 3.5–5.3)
Sodium: 137 mmol/L (ref 135–146)
Total Bilirubin: 0.4 mg/dL (ref 0.2–1.2)
Total Protein: 7 g/dL (ref 6.1–8.1)

## 2018-09-21 LAB — IRON,TIBC AND FERRITIN PANEL
%SAT: 11 % (calc) — ABNORMAL LOW (ref 16–45)
Ferritin: 10 ng/mL — ABNORMAL LOW (ref 16–232)
Iron: 44 ug/dL (ref 40–190)
TIBC: 417 mcg/dL (calc) (ref 250–450)

## 2018-09-21 LAB — LIPID PANEL
Cholesterol: 158 mg/dL (ref <200)
HDL: 88 mg/dL (ref >50)
LDL Cholesterol (Calc): 57 mg/dL (calc)
Non-HDL Cholesterol (Calc): 70 mg/dL (calc) (ref <130)
Total CHOL/HDL Ratio: 1.8 (calc) (ref <5.0)
Triglycerides: 57 mg/dL (ref <150)

## 2018-09-21 LAB — CBC
HCT: 35.2 % (ref 35.0–45.0)
Hemoglobin: 11.3 g/dL — ABNORMAL LOW (ref 11.7–15.5)
MCH: 25.7 pg — ABNORMAL LOW (ref 27.0–33.0)
MCHC: 32.1 g/dL (ref 32.0–36.0)
MCV: 80.2 fL (ref 80.0–100.0)
MPV: 9.7 fL (ref 7.5–12.5)
Platelets: 355 10*3/uL (ref 140–400)
RBC: 4.39 10*6/uL (ref 3.80–5.10)
RDW: 17.4 % — ABNORMAL HIGH (ref 11.0–15.0)
WBC: 4.7 10*3/uL (ref 3.8–10.8)

## 2018-09-21 LAB — VITAMIN D 25 HYDROXY (VIT D DEFICIENCY, FRACTURES): Vit D, 25-Hydroxy: 27 ng/mL — ABNORMAL LOW (ref 30–100)

## 2018-09-21 LAB — TSH: TSH: 0.98 mIU/L

## 2018-09-21 LAB — VITAMIN B12: Vitamin B-12: 758 pg/mL (ref 200–1100)

## 2018-09-23 MED ORDER — VITAMIN D (ERGOCALCIFEROL) 1.25 MG (50000 UNIT) PO CAPS: 50000.0000 [IU] | ORAL_CAPSULE | ORAL | 0 refills | Status: DC

## 2018-09-23 NOTE — Addendum Note (Signed)
Addended by: Maryla Morrow on: 09/23/2018 08:16 AM   Modules accepted: Orders

## 2018-12-12 ENCOUNTER — Ambulatory Visit: Payer: BLUE CROSS/BLUE SHIELD | Admitting: Osteopathic Medicine

## 2018-12-31 ENCOUNTER — Other Ambulatory Visit: Payer: Self-pay | Admitting: Osteopathic Medicine

## 2018-12-31 ENCOUNTER — Encounter: Payer: Self-pay | Admitting: Osteopathic Medicine

## 2018-12-31 MED ORDER — ALBUTEROL SULFATE HFA 108 (90 BASE) MCG/ACT IN AERS: 2.0000 | INHALATION_SPRAY | Freq: Four times a day (QID) | RESPIRATORY_TRACT | 3 refills | Status: DC | PRN

## 2019-01-17 ENCOUNTER — Other Ambulatory Visit: Payer: Self-pay | Admitting: Osteopathic Medicine

## 2019-01-18 NOTE — Telephone Encounter (Signed)
Please review

## 2019-08-29 ENCOUNTER — Encounter: Payer: Self-pay | Admitting: *Deleted

## 2019-08-29 ENCOUNTER — Emergency Department (INDEPENDENT_AMBULATORY_CARE_PROVIDER_SITE_OTHER): Payer: BLUE CROSS/BLUE SHIELD

## 2019-08-29 ENCOUNTER — Other Ambulatory Visit: Payer: Self-pay

## 2019-08-29 ENCOUNTER — Emergency Department (INDEPENDENT_AMBULATORY_CARE_PROVIDER_SITE_OTHER)
Admission: EM | Admit: 2019-08-29 | Discharge: 2019-08-29 | Disposition: A | Discharge status: Home / Self Care | Payer: BLUE CROSS/BLUE SHIELD | Source: Home / Self Care | Attending: Family Medicine | Admitting: Family Medicine

## 2019-08-29 DIAGNOSIS — M5416 Radiculopathy, lumbar region: Secondary | ICD-10-CM

## 2019-08-29 DIAGNOSIS — S335XXA Sprain of ligaments of lumbar spine, initial encounter: Secondary | ICD-10-CM

## 2019-08-29 DIAGNOSIS — M5432 Sciatica, left side: Secondary | ICD-10-CM

## 2019-08-29 DIAGNOSIS — M545 Low back pain: Secondary | ICD-10-CM | POA: Diagnosis not present

## 2019-08-29 MED ORDER — CYCLOBENZAPRINE HCL 10 MG PO TABS: 10.0000 mg | ORAL_TABLET | Freq: Two times a day (BID) | ORAL | 0 refills | Status: DC | PRN

## 2019-08-29 MED ORDER — PREDNISONE 20 MG PO TABS: ORAL_TABLET | ORAL | 0 refills | Status: DC

## 2019-08-29 NOTE — ED Triage Notes (Signed)
Patient repots 1 week ago feeling sudden pain in her lower back when putting a case of water in her car and turing. Taken bayer back and body. Pain improved but is now worse and radiating into her left hip.

## 2019-08-29 NOTE — ED Provider Notes (Signed)
[facility]    CSN: [id number] Arrival date & time: 08/29/19  [id number]      History   Chief Complaint Chief Complaint  Patient presents with  . Back Pain    HPI Lisa Tran is a 43 y.o. female.   While twisting to put a case of water in the back seat of her car one week ago, patient felt a sudden pain in her left lower back and buttock area.  The pain was improving until yesterday.  While quickly arising from a sitting position she had a sudden stabbing pain in her left buttock and hip area.  The pain recurs with movement, especially with hip flexion.  She denies bowel or bladder dysfunction, and no saddle numbness.   The history is provided by the patient.  Back Pain Location:  Gluteal region and sacro-iliac joint Quality:  Aching and stabbing Radiates to: left hip. Pain severity:  Moderate Pain is:  Same all the time Onset quality:  Sudden Duration:  1 week Timing:  Constant Progression:  Worsening Chronicity:  New Context: lifting heavy objects   Relieved by:  Nothing Worsened by:  Movement Ineffective treatments: [place]. Associated symptoms: no abdominal pain, no abdominal swelling, no bladder incontinence, no bowel incontinence, no dysuria, no fever, no leg pain, no numbness, no paresthesias, no pelvic pain, no perianal numbness, no tingling, no weakness and no weight loss   Risk factors: obesity     Past Medical History:  Diagnosis Date  . Allergy   . Asthma     Patient Active Problem List   Diagnosis Date Noted  . Fibroids 10/15/2017  . 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 Cervical paraspinal muscle spasm 10/05/2015  . Iron deficiency 03/16/2015  . Other specified postprocedural states 02/25/2015  . Morbid obesity ([place]) 02/25/2015    Past Surgical History:  Procedure Laterality Date  . COSMETIC SURGERY    . GASTRIC BYPASS  2003  . KNEE SURGERY  2014    OB History    Gravida  1   Para      Term      Preterm      AB  1   Living        SAB      TAB      Ectopic      Multiple      Live Births               Home Medications    Prior to Admission medications   Medication Sig Start Date End Date Taking? Authorizing Provider  albuterol (PROVENTIL HFA;VENTOLIN HFA) 108 (90 Base) MCG/ACT inhaler Inhale 2 puffs into the lungs every 6 (six) hours as needed. For shortness of breath or wheezing 12/31/18   Emeterio Reeve, DO  AMBULATORY NON FORMULARY MEDICATION Knee-high, medium/grade II compression, graduated compression stockings. Large circ Apply to lower extremities. 07/16/17   Trixie Dredge, PA-C  cetirizine (ZYRTEC) 10 MG tablet Take 10 mg by mouth daily.    [provider]  Cholecalciferol (VITAMIN D3) 1000 units CAPS Take by mouth.    [provider]  cyclobenzaprine (FLEXERIL) 10 MG tablet Take 1 tablet (10 mg total) by mouth 2 (two) times daily as needed for muscle spasms. 08/29/19   Kandra Nicolas, MD  ferrous fumarate-iron polysaccharide complex (TANDEM) 162-115.2 MG CAPS capsule Take by mouth. 03/16/15   [provider]  Multiple Vitamin (MULTIVITAMIN WITH MINERALS) TABS Take 1 tablet by  mouth daily.    [provider]  predniSONE (DELTASONE) 20 MG tablet Take one tab by mouth twice daily for 4 days, then one daily for 3 days. Take with food. 08/29/19   Kandra Nicolas, MD  vitamin B-12 (CYANOCOBALAMIN) 100 MCG tablet Take 100 mcg by mouth daily.    [provider]  Vitamin D, Ergocalciferol, (DRISDOL) 1.25 MG (50000 UT) CAPS capsule Take 1 capsule (50,000 Units total) by mouth every 7 (seven) days. Take for 12 total doses(weeks) 09/23/18   Emeterio Reeve, DO    Family History Family History  Problem Relation Age of Onset  . Diabetes Maternal Aunt   . Diabetes Maternal Grandfather   . Cancer Maternal Grandfather        prostate  . Hypertension Maternal Aunt   . Asthma Mother     Social  History Social History   Tobacco Use  . Smoking status: Never Smoker  . Smokeless tobacco: Never Used  Substance Use Topics  . Alcohol use: Yes    Comment: occ  . Drug use: No     Allergies   Shellfish allergy   Review of Systems Review of Systems  Constitutional: Negative for fever and weight loss.  Gastrointestinal: Negative for abdominal pain and bowel incontinence.  Genitourinary: Negative for bladder incontinence, dysuria and pelvic pain.  Musculoskeletal: Positive for back pain.  Neurological: Negative for tingling, weakness, numbness and paresthesias.  All other systems reviewed and are negative.    Physical Exam Triage Vital Signs ED Triage Vitals  Enc Vitals Group     BP 08/29/19 0848 136/83     Pulse Rate 08/29/19 0848 67     Resp 08/29/19 0848 14     Temp 08/29/19 0848 98.4 F (36.9 C)     Temp Source 08/29/19 0848 Oral     SpO2 08/29/19 0848 99 %     Weight 08/29/19 0849 (!) 303 lb (137.4 kg)     Height 08/29/19 0849 5' 5.5" (1.664 m)     Head Circumference --      Peak Flow --      Pain Score 08/29/19 0848 7     Pain Loc --      Pain Edu? --      Excl. in [place]? --    No data found.  Updated Vital Signs BP 136/83 (BP Location: Right Arm)   Pulse 67   Temp 98.4 F (36.9 C) (Oral)   Resp 14   Ht 5' 5.5" (1.664 m)   Wt (!) 137.4 kg   SpO2 99%   BMI 49.65 kg/m   Visual Acuity Right Eye Distance:   Left Eye Distance:   Bilateral Distance:    Right Eye Near:   Left Eye Near:    Bilateral Near:     Physical Exam Vitals signs and nursing note reviewed.  Constitutional:      General: She is not in acute distress.    Appearance: She is obese.  HENT:     Head: Normocephalic.  Eyes:     Pupils: Pupils are equal, round, and reactive to light.  Cardiovascular:     Rate and Rhythm: Regular rhythm.     Heart sounds: Normal heart sounds.  Pulmonary:     Effort: Pulmonary effort is normal.  Abdominal:     Palpations: Abdomen is soft.      Tenderness: There is no abdominal tenderness.  Musculoskeletal:       Back:  Right lower leg: No edema.     Left lower leg: No edema.     Comments: Back:  Range of motion relatively well preserved.  Can heel/toe walk and squat without difficulty.  Tenderness in left Sacral area.  Straight leg raising test is negative.  Sitting knee extension test is negative.  Strength and sensation in the lower extremities is normal.  Patellar and achilles reflexes are normal.  FABER positive.   Skin:    General: Skin is warm and dry.     Findings: No rash.  Neurological:     Mental Status: She is alert.      UC Treatments / Results  Labs (all labs ordered are listed, but only abnormal results are displayed) Labs Reviewed - No data to display  EKG   Radiology Dg Lumbar Spine Complete  Result Date: 08/29/2019 CLINICAL DATA:  Low back pain with left-sided radicular symptoms EXAM: LUMBAR SPINE - COMPLETE 4+ VIEW COMPARISON:  None. FINDINGS: Frontal, lateral, spot lumbosacral lateral, and bilateral oblique views were obtained. There are 5 non-rib-bearing lumbar type vertebral bodies. There is no fracture or spondylolisthesis. There is mild disc space narrowing at L3-4. Other disc spaces appear unremarkable. There is no appreciable facet arthropathy. There is an intrauterine device in the mid-pelvis. IMPRESSION: Disc space narrowing L3-4. Other disc spaces appear unremarkable. No fracture or spondylolisthesis. No appreciable facet arthropathy. Intrauterine device in mid pelvis. Electronically Signed   By: Lowella Grip III M.D.   On: 08/29/2019 09:50    Procedures Procedures (including critical care time)  Medications Ordered in UC Medications - No data to display  Initial Impression / Assessment and Plan / UC Course  I have reviewed the triage vital signs and the nursing notes.  Pertinent labs & imaging results that were available during my care of the patient were reviewed by me and  considered in my medical decision making (see chart for details).    Begin prednisone burst/taper and Flexeril. Followup with Dr. Aundria Mems ([facility]) if not improving about three weeks.   Final Clinical Impressions(s) / UC Diagnoses   Final diagnoses:  Sprain of low back, initial encounter  Sciatica of left side     Discharge Instructions     Apply ice pack for 20 to 30 minutes, 3 to 4 times daily  Continue until pain and swelling decrease.  Begin range of motion and stretching exercises as tolerated. May take Tylenol 1000mg  at bedtime if needed for pain.    ED Prescriptions    Medication Sig Dispense Auth. Provider   predniSONE (DELTASONE) 20 MG tablet Take one tab by mouth twice daily for 4 days, then one daily for 3 days. Take with food. 11 tablet Kandra Nicolas, MD   cyclobenzaprine (FLEXERIL) 10 MG tablet Take 1 tablet (10 mg total) by mouth 2 (two) times daily as needed for muscle spasms. 14 tablet Kandra Nicolas, MD        Kandra Nicolas, MD 08/29/19 1022

## 2019-08-29 NOTE — Discharge Instructions (Addendum)
Apply ice pack for 20 to 30 minutes, 3 to 4 times daily  Continue until pain and swelling decrease.  Begin range of motion and stretching exercises as tolerated. May take Tylenol 1000mg  at bedtime if needed for pain.

## 2019-09-02 ENCOUNTER — Other Ambulatory Visit: Payer: Self-pay

## 2019-09-02 ENCOUNTER — Ambulatory Visit (INDEPENDENT_AMBULATORY_CARE_PROVIDER_SITE_OTHER): Payer: BLUE CROSS/BLUE SHIELD | Admitting: Osteopathic Medicine

## 2019-09-02 ENCOUNTER — Encounter: Payer: Self-pay | Admitting: Osteopathic Medicine

## 2019-09-02 ENCOUNTER — Ambulatory Visit (INDEPENDENT_AMBULATORY_CARE_PROVIDER_SITE_OTHER): Payer: BLUE CROSS/BLUE SHIELD

## 2019-09-02 VITALS — BP 147/85 | HR 79 | Temp 98.3°F | Wt 304.1 lb

## 2019-09-02 DIAGNOSIS — M5116 Intervertebral disc disorders with radiculopathy, lumbar region: Secondary | ICD-10-CM | POA: Diagnosis not present

## 2019-09-02 DIAGNOSIS — M5416 Radiculopathy, lumbar region: Secondary | ICD-10-CM | POA: Diagnosis not present

## 2019-09-02 DIAGNOSIS — R29898 Other symptoms and signs involving the musculoskeletal system: Secondary | ICD-10-CM | POA: Diagnosis not present

## 2019-09-02 DIAGNOSIS — M5442 Lumbago with sciatica, left side: Secondary | ICD-10-CM | POA: Diagnosis not present

## 2019-09-02 DIAGNOSIS — M48061 Spinal stenosis, lumbar region without neurogenic claudication: Secondary | ICD-10-CM | POA: Diagnosis not present

## 2019-09-02 DIAGNOSIS — M5126 Other intervertebral disc displacement, lumbar region: Secondary | ICD-10-CM

## 2019-09-02 NOTE — Progress Notes (Addendum)
HPI: Lisa Tran is a 43 y.o. female who  has a past medical history of Allergy and Asthma.  she presents to [facility] today, 09/02/19,  for chief complaint of:  BACK PAIN, LEG PAIN  was lifting a large case of water 2 weeks ago and felt a pull in lower back. UC visit 08/29/19 (4 days ago - see notes). Pt was improving prior to UC visit but was noting worsening pain in L buttock/hip area. Now having leg numbness and weakness on the left, causing unsteadiness. Tried PT and meds (prednisonet taper, flexeril)  from UC and advised f/u w/ sports med if not improving. Unable to see Dr T so is here to see me today.       At today's visit 09/02/19 ... PMH, PSH, FH reviewed and updated as needed.  Current medication list and allergy/intolerance hx reviewed and updated as needed. (See remainder of HPI, ROS, Phys Exam below)   No results found.  No results found for this or any previous visit (from the past 72 hour(s)).  DG Lumbar - 08/29/19 IMPRESSION: Disc space narrowing L3-4. Other disc spaces appear unremarkable. No fracture or spondylolisthesis. No appreciable facet arthropathy. Intrauterine device in mid pelvis.   Mr Lumbar Spine Wo Contrast  Result Date: 09/02/2019 CLINICAL DATA:  Left leg weakness. Left buttock and groin pain 2 weeks. EXAM: MRI LUMBAR SPINE WITHOUT CONTRAST TECHNIQUE: Multiplanar, multisequence MR imaging of the lumbar spine was performed. No intravenous contrast was administered. COMPARISON:  Lumbar radiographs 08/29/2019 FINDINGS: Segmentation:  Normal Alignment:  Normal Vertebrae:  Normal bone marrow.  Negative for fracture or mass. Conus medullaris and cauda equina: Conus extends to the L1-2 level. Conus and cauda equina appear normal. Paraspinal and other soft tissues: Negative for paraspinous mass or adenopathy. No paraspinous soft tissue edema or fluid collection. Disc levels: L1-2: Negative L2-3: Negative L3-4: Diffuse  bulging of the disc. Small extraforaminal disc protrusion on the left with displacement of the left L3 nerve root. Mild facet degeneration and mild spinal stenosis. Mild subarticular stenosis on the left. L4-5: Mild disc bulging.  Negative for stenosis. L5-S1: Negative IMPRESSION: Disc bulging L3-4 with small extraforaminal disc protrusion on the left. Mild spinal stenosis and mild subarticular stenosis on the left. Electronically Signed   By: Franchot Gallo M.D.   On: 09/02/2019 15:49       ASSESSMENT/PLAN: The primary encounter diagnosis was Acute left-sided low back pain with left-sided sciatica. Diagnoses of Left leg weakness and Lumbar radiculopathy were also pertinent to this visit.  New onset weakness after initial improvement, will get MRI for further evaluation especially given falls at home. thankfully no saddle anesthesia or insontinence, might need to get formal PT on board and assistive devices, will send steroids.   Orders Placed This Encounter  Procedures  . MR Lumbar Spine Wo Contrast     Addendum 09/03/19   Spoke to patient over phone and delivered results.  MRI findings make sense with symptoms, patient states weakness is stable, pain is stable.  Will trial conservative management with limited activity, pain management, repeat steroid taper.  Discussed specialist referral, patient would like to have something set up with neurosurgery just in case she starts to feel worse, and I would tend to agree there is every chance that she would improve with conservative management and physical therapy starting after the holiday weekend.       Follow-up plan: Return for RECHECK PENDING RESULTS / IF WORSE OR  CHANGE.                                                 ################################################# ################################################# ################################################# #################################################    Current Meds  Medication Sig  . albuterol (PROVENTIL HFA;VENTOLIN HFA) 108 (90 Base) MCG/ACT inhaler Inhale 2 puffs into the lungs every 6 (six) hours as needed. For shortness of breath or wheezing  . AMBULATORY NON FORMULARY MEDICATION Knee-high, medium/grade II compression, graduated compression stockings. Large circ Apply to lower extremities.  . cetirizine (ZYRTEC) 10 MG tablet Take 10 mg by mouth daily.  . Cholecalciferol (VITAMIN D3) 1000 units CAPS Take by mouth.  . cyclobenzaprine (FLEXERIL) 10 MG tablet Take 1 tablet (10 mg total) by mouth 2 (two) times daily as needed for muscle spasms.  . ferrous fumarate-iron polysaccharide complex (TANDEM) 162-115.2 MG CAPS capsule Take by mouth.  . Multiple Vitamin (MULTIVITAMIN WITH MINERALS) TABS Take 1 tablet by mouth daily.  . predniSONE (DELTASONE) 20 MG tablet Take one tab by mouth twice daily for 4 days, then one daily for 3 days. Take with food.  . vitamin B-12 (CYANOCOBALAMIN) 100 MCG tablet Take 100 mcg by mouth daily.   Current Facility-Administered Medications for the 09/02/19 encounter (Office Visit) with Emeterio Reeve, DO  Medication  . levonorgestrel (MIRENA) 20 MCG/24HR IUD    Allergies  Allergen Reactions  . Shellfish Allergy Anaphylaxis       Review of Systems:  Constitutional: No recent illness  HEENT: No  headache, no vision change  Cardiac: No  chest pain, No  pressure, No palpitations  Respiratory:  No  shortness of breath. No  Cough  Gastrointestinal: No  abdominal pain, no change on bowel habits   Musculoskeletal: +new myalgia/arthralgia  Skin: No  Rash  Neurologic: +L leg weakness, No   Dizziness  Psychiatric: No  concerns with depression, No  concerns with anxiety  Exam:  BP (!) 147/85 (BP Location: Left Arm, Patient Position: Sitting, Cuff Size: Large)   Pulse 79   Temp 98.3 F (36.8 C) (Oral)   Wt (!) 304 lb 1.9 oz (137.9 kg)   BMI 49.84 kg/m   Constitutional: VS see above. General Appearance: alert, well-developed, well-nourished, NAD  Eyes: Normal lids and conjunctive, non-icteric sclera  Neck: No masses, trachea midline.   Respiratory: Normal respiratory effort. no wheeze, no rhonchi, no rales  Cardiovascular: S1/S2 normal, no murmur, no rub/gallop auscultated. RRR.   Musculoskeletal: Gait antalgic, favoring LLE. Reduced strength 3-4 / 5 on L leg to hip flexion, knee extension and flexion.   Neurological: Normal balance/coordination. No tremor. Decreased sensation L lower leg lateral distal to knee  Skin: warm, dry, intact.   Psychiatric: Normal judgment/insight. Normal mood and affect. Oriented x3.       Visit summary with medication list and pertinent instructions was printed for patient to review, patient was advised to alert [place] if any updates are needed. All questions at time of visit were answered - patient instructed to contact office with any additional concerns. ER/RTC precautions were reviewed with the patient and understanding verbalized.   Note: Total time spent 25 minutes, greater than 50% of the visit was spent face-to-face counseling and coordinating care for the following: The primary encounter diagnosis was Acute left-sided low back pain with left-sided sciatica. Diagnoses of Left leg weakness and Lumbar radiculopathy were also pertinent to  this visit.Marland Kitchen  Please note: voice recognition software was used to produce this document, and typos may escape review. Please contact Dr. Sheppard Coil for any needed clarifications.    Follow up plan: Return for RECHECK PENDING RESULTS / IF WORSE OR CHANGE.

## 2019-09-03 MED ORDER — OXYCODONE-ACETAMINOPHEN 5-325 MG PO TABS: 1.0000 | ORAL_TABLET | Freq: Four times a day (QID) | ORAL | 0 refills | Status: AC | PRN

## 2019-09-03 MED ORDER — OXYCODONE-ACETAMINOPHEN 5-325 MG PO TABS: 1.0000 | ORAL_TABLET | Freq: Four times a day (QID) | ORAL | 0 refills | Status: DC | PRN

## 2019-09-03 MED ORDER — PREDNISONE 10 MG (48) PO TBPK: ORAL_TABLET | Freq: Every day | ORAL | 0 refills | Status: DC

## 2019-09-03 NOTE — Addendum Note (Signed)
Addended by: Maryla Morrow on: 09/03/2019 11:02 AM   Modules accepted: Orders

## 2019-09-08 ENCOUNTER — Encounter: Payer: Self-pay | Admitting: Osteopathic Medicine

## 2019-09-10 ENCOUNTER — Encounter: Payer: Self-pay | Admitting: Osteopathic Medicine

## 2019-09-10 DIAGNOSIS — M5416 Radiculopathy, lumbar region: Secondary | ICD-10-CM

## 2019-09-10 DIAGNOSIS — M5116 Intervertebral disc disorders with radiculopathy, lumbar region: Secondary | ICD-10-CM

## 2019-09-10 DIAGNOSIS — M5442 Lumbago with sciatica, left side: Secondary | ICD-10-CM

## 2019-09-10 NOTE — Telephone Encounter (Signed)
I have never seen this patient, I would think this should go to Dr. Sheppard Coil.

## 2019-09-10 NOTE — Telephone Encounter (Signed)
Received referral for PT today sent it to [facility] as Provider had stated. - CF

## 2019-09-12 DIAGNOSIS — Z20828 Contact with and (suspected) exposure to other viral communicable diseases: Secondary | ICD-10-CM | POA: Diagnosis not present

## 2019-09-15 ENCOUNTER — Other Ambulatory Visit: Payer: Self-pay

## 2019-09-15 ENCOUNTER — Ambulatory Visit (INDEPENDENT_AMBULATORY_CARE_PROVIDER_SITE_OTHER): Payer: BLUE CROSS/BLUE SHIELD | Admitting: Physical Therapy

## 2019-09-15 DIAGNOSIS — M5416 Radiculopathy, lumbar region: Secondary | ICD-10-CM

## 2019-09-15 NOTE — Therapy (Signed)
[place], [place], [address] Phone: [phone]   Fax:  [phone]  Physical Therapy Evaluation  Patient Details  Name: Lisa Tran MRN: [id number] Date of Birth: 1976-08-11 Referring Provider (PT): Emeterio Reeve DO   Encounter Date: 09/15/2019  PT End of Session - 09/15/19 0757    Visit Number  1    Number of Visits  12    Date for PT Re-Evaluation  10/27/19    PT Start Time  0800    PT Stop Time  0845    PT Time Calculation (min)  45 min    Activity Tolerance  Patient tolerated treatment well    Behavior During Therapy  [facility] for tasks assessed/performed       Past Medical History:  Diagnosis Date  . Allergy   . Asthma     Past Surgical History:  Procedure Laterality Date  . COSMETIC SURGERY    . GASTRIC BYPASS  2003  . KNEE SURGERY  2014    There were no vitals filed for this visit.   Subjective Assessment - 09/15/19 0800    Subjective  About 3 weeks ago patient hurt her low back lifting case of water into her car. Then her pain moved into left hip and groin and she fell getting out of the shower because her leg gave out. She now feels numbness and tingling  in ant lower leg and has weakness there as well. Pain is mainly in the left groin. Sharp when walking.    Pertinent History  asthma, obesity    Diagnostic tests  xrays; MRI - L3/4 disc bulge on left affecting L3 nerve root    Patient Stated Goals  to avoid surgery    Currently in Pain?  Yes    Pain Score  3     Pain Location  Groin    Pain Orientation  Left    Pain Descriptors / Indicators  Sharp    Pain Type  Acute pain    Pain Radiating Towards  LLE    Pain Onset  1 to 4 weeks ago    Pain Frequency  Constant    Aggravating Factors   unsure    Pain Relieving Factors  heat         [facility] PT Assessment - 09/15/19 0001      Assessment   Medical Diagnosis  acute LBP with sciatica; lumbar radiculopathy    Referring Provider (PT)  Emeterio Reeve DO    Onset Date/Surgical Date  08/25/19    Next MD Visit  six weeks    Prior Therapy  yes      Precautions   Precautions  None      Restrictions   Weight Bearing Restrictions  No      Balance Screen   Has the patient fallen in the past 6 months  Yes    How many times?  1    Has the patient had a decrease in activity level because of a fear of falling?   No    Is the patient reluctant to leave their home because of a fear of falling?   No      Home Environment   Living Environment  Private residence    Additional Comments  stairs      Prior Function   Level of Independence  Independent    Vocation  Full time employment    Vocation Requirements  sitting at computer  Leisure  walking, going to the gym      Posture/Postural Control   Posture Comments  increased lordosis      ROM / Strength   AROM / PROM / Strength  AROM;Strength      AROM   Overall AROM Comments  Lumbar WFL; ext felt pain in groin; flexion just pull in low back      Strength   Overall Strength Comments  bil DF 5/5,     Strength Assessment Site  Hip;Knee    Right/Left Hip  Right;Left    Right Hip Flexion  4-/5    Right Hip Extension  5/5    Right Hip ABduction  5/5    Left Hip Flexion  3+/5    Left Hip Extension  3-/5    Left Hip ABduction  5/5    Right/Left Knee  --   5/5 bil     Flexibility   Soft Tissue Assessment /Muscle Length  yes    Hamstrings  mod tightness bil    Quadriceps  mod tightness; left feels in groin    Piriformis  WNL    Quadratus Lumborum  WNL      Palpation   Spinal mobility  tightness with PA on right side    Palpation comment  unremarkable      Special Tests   Other special tests  + prone knee flex left                Objective measurements completed on examination: See above findings.              PT Education - 09/15/19 1603    Education Details  HEP; explanation of disc bulge and importance of  avoiding flexion and any activities that increase her sx.    Person(s) Educated  Patient    Methods  Explanation;Demonstration;Handout    Comprehension  Verbalized understanding;Returned demonstration       PT Short Term Goals - 09/15/19 1623      PT SHORT TERM GOAL #1   Title  Patient able to cetralize LLE sx to low back    Time  3    Period  Weeks    Status  New    Target Date  10/06/19        PT Long Term Goals - 09/15/19 1615      PT LONG TERM GOAL #1   Title  Ind with HEP to prevent further injury    Time  6    Period  Weeks    Status  New    Target Date  10/27/19      PT LONG TERM GOAL #2   Title  Patient able to perform ADLS with [id number] less pain in low back.    Time  6    Period  Weeks    Status  New      PT LONG TERM GOAL #3   Title  Patient able to perform ADLS and her normal work out routine without radicular sx into LLE.      PT LONG TERM GOAL #4   Title  Pt to demo 4+/5 or better strength in BLE to improve function and prevent falls.    Time  6    Period  Weeks    Status  New             Plan - 09/15/19 1606    Clinical Impression Statement  Patient presents today with c/o left groin pain  after hurting her back lifting a case of water in early November 2020. Pain was originally in her back but she now feels it only in her groin. She also reports N/T in left ant lower leg. She has weakness in lumbar stabilizers and LLE. She is unable to do her normal work outs including walking. Her goal is to avoid surgery. She works at home and has a stand up desk. She is unable to tolerate prone lying without sx so we started extension protocol over 2 pillows. Patient will benefit from PT to address her deficits and return her to her PLOF.    Personal Factors and Comorbidities  Comorbidity 2    Comorbidities  asthma, obesity    Stability/Clinical Decision Making  Evolving/Moderate complexity    Clinical Decision Making  Low    Rehab Potential  Good    PT Frequency   2x / week    PT Duration  6 weeks    PT Treatment/Interventions  ADLs/Self Care Home Management;Cryotherapy;Electrical Stimulation;Moist Heat;Traction;Therapeutic activities;Therapeutic exercise;Neuromuscular re-education;Patient/family education;Dry needling;Manual techniques;Taping    PT Next Visit Plan  ADL modifications/body mechanics, extension protocol (starting over 2 pillows), LE strengthening    PT Home Exercise Plan  [email or web address]    Consulted and Agree with Plan of Care  Patient       Patient will benefit from skilled therapeutic intervention in order to improve the following deficits and impairments:  Decreased range of motion, Decreased activity tolerance, Pain, Impaired flexibility, Decreased strength, Postural dysfunction  Visit Diagnosis: Radiculopathy, lumbar region - Plan: PT plan of care cert/re-cert     Problem List Patient Active Problem List   Diagnosis Date Noted  . Fibroids 10/15/2017  . 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 Cervical paraspinal muscle spasm 10/05/2015  . Iron deficiency 03/16/2015  . Other specified postprocedural states 02/25/2015  . Morbid obesity ([place]) 02/25/2015    Madelyn Flavors PT 09/15/2019, 4:26 PM  [facility] [place], [place], [address] Phone: [phone]   Fax:  [phone]  Name: Lisa Tran MRN: [id number] Date of Birth: 1976/09/21

## 2019-09-15 NOTE — Patient Instructions (Signed)
Access Code: [email or web address]  URL: [email or web address]  Date: 09/15/2019  Prepared by: Almyra Free Loredana Medellin   Exercises Prone Transversus Abdominus Contraction - 10 reps - 1 sets - 5 sec hold hold - 2x daily - 7x weekly Prone Hip Extension with Bent Knee - Two Pillows - 10 reps - 3 sets - 1x daily - 7x weekly Lying Prone with 2 Pillows - 1 reps - 1 sets - 5 min hold - 4-5x daily - 7x weekly

## 2019-09-17 ENCOUNTER — Other Ambulatory Visit: Payer: Self-pay

## 2019-09-17 ENCOUNTER — Ambulatory Visit (INDEPENDENT_AMBULATORY_CARE_PROVIDER_SITE_OTHER): Payer: BLUE CROSS/BLUE SHIELD | Admitting: Physical Therapy

## 2019-09-17 ENCOUNTER — Other Ambulatory Visit: Payer: Self-pay | Admitting: Osteopathic Medicine

## 2019-09-17 DIAGNOSIS — Z1231 Encounter for screening mammogram for malignant neoplasm of breast: Secondary | ICD-10-CM

## 2019-09-17 DIAGNOSIS — M5416 Radiculopathy, lumbar region: Secondary | ICD-10-CM

## 2019-09-17 NOTE — Therapy (Signed)
[place], [place], [address] Phone: [phone]   Fax:  [phone]  Physical Therapy Treatment  Patient Details  Name: Lisa Tran MRN: [id number] Date of Birth: 09-Jul-1976 Referring Provider (PT): Emeterio Reeve DO   Encounter Date: 09/17/2019  PT End of Session - 09/17/19 0812    Visit Number  2    Number of Visits  12    Date for PT Re-Evaluation  10/27/19    PT Start Time  0802    PT Stop Time  [id number]    PT Time Calculation (min)  42 min    Activity Tolerance  Patient tolerated treatment well;No increased pain    Behavior During Therapy  WFL for tasks assessed/performed       Past Medical History:  Diagnosis Date  . Allergy   . Asthma     Past Surgical History:  Procedure Laterality Date  . COSMETIC SURGERY    . GASTRIC BYPASS  2003  . KNEE SURGERY  2014    There were no vitals filed for this visit.  Subjective Assessment - 09/17/19 0812    Subjective  Pt reports no new changes since last visit.  She's tried doing exercises on floor, but it is very difficult getting down/back up.    Patient Stated Goals  to avoid surgery    Currently in Pain?  Yes    Pain Score  4     Pain Location  Leg   groin and shin   Pain Orientation  Left    Pain Descriptors / Indicators  Aching    Aggravating Factors   unsure    Pain Relieving Factors  heat         [facility] PT Assessment - 09/17/19 0001      Assessment   Medical Diagnosis  acute LBP with sciatica; lumbar radiculopathy    Referring Provider (PT)  Emeterio Reeve DO    Onset Date/Surgical Date  08/25/19    Next MD Visit  six weeks    Prior Therapy  yes      [facility] Adult PT Treatment/Exercise - 09/17/19 0001      Self-Care   Self-Care  Other Self-Care Comments    Other Self-Care Comments   educated pt on self TPR with ball to ant hip in prone and ant shin; pt returned demo with cues.   Initiated back care/ body mechanics  with pt; pt issued handout.       Exercises   Exercises  Lumbar      Lumbar Exercises: Stretches   Prone on Elbows Stretch  5 reps;10 seconds    Prone on Elbows Stretch Limitations  prone position with hands at forehead x 2 min prior to POE, no pillow needed under abdomen.     Other Lumbar Stretch Exercise  seated hip flexor stretch x 3 reps on LLE, 1 rep on RLE x 20 seconds.       Lumbar Exercises: Aerobic   Nustep  L4: (legs only) x 6 min       Lumbar Exercises: Seated   Sit to Stand  --   3 reps with ab set     Lumbar Exercises: Supine   Ab Set  5 seconds;10 reps    AB Set Limitations  (air bladder biofeedback utilized, to avoid post pelvic tilt) and tactile cues.    Bent Knee Raise  5 reps;2 seconds   with ab set, 2 sets  Other Supine Lumbar Exercises  reviewed sit to/from supine via netural spin and log roll; pt returned demo with cues x 3 reps              PT Education - 09/17/19 0839    Education Details  posture and body mechanics.    Person(s) Educated  Patient    Methods  Explanation;Demonstration;Verbal cues;Handout    Comprehension  Verbalized understanding;Returned demonstration       PT Short Term Goals - 09/15/19 1623      PT SHORT TERM GOAL #1   Title  Patient able to cetralize LLE sx to low back    Time  3    Period  Weeks    Status  New    Target Date  10/06/19        PT Long Term Goals - 09/15/19 1615      PT LONG TERM GOAL #1   Title  Ind with HEP to prevent further injury    Time  6    Period  Weeks    Status  New    Target Date  10/27/19      PT LONG TERM GOAL #2   Title  Patient able to perform ADLS with [id number] less pain in low back.    Time  6    Period  Weeks    Status  New      PT LONG TERM GOAL #3   Title  Patient able to perform ADLS and her normal work out routine without radicular sx into LLE.      PT LONG TERM GOAL #4   Title  Pt to demo 4+/5 or better strength in BLE to improve function and prevent falls.    Time   6    Period  Weeks    Status  New            Plan - 09/17/19 [id number]    Clinical Impression Statement  Pt reported reduction of groin pain after TPR with ball to hip flexor.  Pt was able to tolerate POE today without increase in pain. Mild increase in discomfort with supine hip flexion with ab set; reduced with self massage.  Pt progressing towards goals.    Personal Factors and Comorbidities  Comorbidity 2    Comorbidities  asthma, obesity    Stability/Clinical Decision Making  Evolving/Moderate complexity    Rehab Potential  Good    PT Frequency  2x / week    PT Duration  6 weeks    PT Treatment/Interventions  ADLs/Self Care Home Management;Cryotherapy;Electrical Stimulation;Moist Heat;Traction;Therapeutic activities;Therapeutic exercise;Neuromuscular re-education;Patient/family education;Dry needling;Manual techniques;Taping    PT Next Visit Plan  ADL modifications/body mechanics, extension protocol (starting over 2 pillows), LE strengthening    PT Home Exercise Plan  [email or web address]    Consulted and Agree with Plan of Care  Patient       Patient will benefit from skilled therapeutic intervention in order to improve the following deficits and impairments:  Decreased range of motion, Decreased activity tolerance, Pain, Impaired flexibility, Decreased strength, Postural dysfunction  Visit Diagnosis: Radiculopathy, lumbar region     Problem List Patient Active Problem List   Diagnosis Date Noted  . Fibroids 10/15/2017  . 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 Cervical paraspinal muscle spasm 10/05/2015  . Iron deficiency 03/16/2015  . Other specified postprocedural states 02/25/2015  . Morbid obesity ([place]) 02/25/2015   Kerin Perna, PTA 09/17/19 2:10 PM  [facility] [place] [address] [place], [place], [address] Phone: [phone]   Fax:  [phone]  Name: Lisa Tran MRN: [id number] Date of Birth: Aug 04, 1976

## 2019-09-17 NOTE — Patient Instructions (Signed)
Sleeping on Back  Place pillow under knees. A pillow with cervical support and a roll around waist are also helpful. Copyright  VHI. All rights reserved.  Sleeping on Side Place pillow between knees. Use cervical support under neck and a roll around waist as needed. Copyright  VHI. All rights reserved.   Sleeping on Stomach   If this is the only desirable sleeping position, place pillow under lower legs, and under stomach or chest as needed.  Posture - Sitting   Sit upright, head facing forward. Try using a roll to support lower back. Keep shoulders relaxed, and avoid rounded back. Keep hips level with knees. Avoid crossing legs for long periods. Stand to Sit / Sit to Stand   To sit: Bend knees to lower self onto front edge of chair, then scoot back on seat. To stand: Reverse sequence by placing one foot forward, and scoot to front of seat. Use rocking motion to stand up.   Work Height and Reach  Ideal work height is no more than 2 to 4 inches below elbow level when standing, and at elbow level when sitting. Reaching should be limited to arm's length, with elbows slightly bent.  Bending  Bend at hips and knees, not back. Keep feet shoulder-width apart.    Posture - Standing   Good posture is important. Avoid slouching and forward head thrust. Maintain curve in low back and align ears over shoul- ders, hips over ankles.  Alternating Positions   Alternate tasks and change positions frequently to reduce fatigue and muscle tension. Take rest breaks. Computer Work   Position work to face forward. Use proper work and seat height. Keep shoulders back and down, wrists straight, and elbows at right angles. Use chair that provides full back support. Add footrest and lumbar roll as needed.  Getting Into / Out of Car  Lower self onto seat, scoot back, then bring in one leg at a time. Reverse sequence to get out.  Dressing  Lie on back to pull socks or slacks over feet, or sit  and bend leg while keeping back straight.    Housework - Sink  Place one foot on ledge of cabinet under sink when standing at sink for prolonged periods.   Pushing / Pulling  Pushing is preferable to pulling. Keep back in proper alignment, and use leg muscles to do the work.  Deep Squat   Squat and lift with both arms held against upper trunk. Tighten stomach muscles without holding breath. Use smooth movements to avoid jerking.  Avoid Twisting   Avoid twisting or bending back. Pivot around using foot movements, and bend at knees if needed when reaching for articles.  Carrying Luggage   Distribute weight evenly on both sides. Use a cart whenever possible. Do not twist trunk. Move body as a unit.   Lifting Principles .Maintain proper posture and head alignment. .Slide object as close as possible before lifting. .Move obstacles out of the way. .Test before lifting; ask for help if too heavy. .Tighten stomach muscles without holding breath. .Use smooth movements; do not jerk. .Use legs to do the work, and pivot with feet. .Distribute the work load symmetrically and close to the center of trunk. .Push instead of pull whenever possible.   Ask For Help   Ask for help and delegate to others when possible. Coordinate your movements when lifting together, and maintain the low back curve.  Log Roll   Lying on back, bend left knee and place left   arm across chest. Roll all in one movement to the right. Reverse to roll to the left. Always move as one unit. Housework - Sweeping  Use long-handled equipment to avoid stooping.   Housework - Wiping  Position yourself as close as possible to reach work surface. Avoid straining your back.  Laundry - Unloading Wash   To unload small items at bottom of washer, lift leg opposite to arm being used to reach.  [place] close to area to be raked. Use arm movements to do the work. Keep back straight and avoid  twisting.     Cart  When reaching into cart with one arm, lift opposite leg to keep back straight.   Getting Into / Out of Bed  Lower self to lie down on one side by raising legs and lowering head at the same time. Use arms to assist moving without twisting. Bend both knees to roll onto back if desired. To sit up, start from lying on side, and use same move-ments in reverse. Housework - Vacuuming  Hold the vacuum with arm held at side. Step back and forth to move it, keeping head up. Avoid twisting.   Laundry - IT consultant so that bending and twisting can be avoided.   Laundry - Unloading Dryer  Squat down to reach into clothes dryer or use a reacher.  Gardening - Weeding / Probation officer or Kneel. Knee pads may be helpful.                    Abdominal Bracing With Pelvic Floor (Hook-Lying)   With neutral spine, tighten pelvic floor and abdominals. Hold 10 seconds. Repeat __10_ times. Do _several__ times a day. Can be performed in sitting, standing. .  Knee to Chest: Transverse Plane Stability   Bring one knee up, then return. Be sure pelvis does not roll side to side. Keep pelvis still. Lift knee __5_ times each leg. Restabilize pelvis. Repeat with other leg. Do _1-2__ sets, _1__ times per day.    [facility] [place], [place] [address]  [phone] (office) [phone] (fax)

## 2019-09-18 ENCOUNTER — Ambulatory Visit (INDEPENDENT_AMBULATORY_CARE_PROVIDER_SITE_OTHER): Payer: BLUE CROSS/BLUE SHIELD

## 2019-09-18 DIAGNOSIS — Z1231 Encounter for screening mammogram for malignant neoplasm of breast: Secondary | ICD-10-CM | POA: Diagnosis not present

## 2019-09-22 ENCOUNTER — Encounter: Payer: Self-pay | Admitting: Physical Therapy

## 2019-09-24 ENCOUNTER — Encounter: Payer: Self-pay | Admitting: Physical Therapy

## 2019-09-29 ENCOUNTER — Ambulatory Visit (INDEPENDENT_AMBULATORY_CARE_PROVIDER_SITE_OTHER): Payer: BLUE CROSS/BLUE SHIELD | Admitting: Physical Therapy

## 2019-09-29 ENCOUNTER — Encounter: Payer: Self-pay | Admitting: Physical Therapy

## 2019-09-29 ENCOUNTER — Other Ambulatory Visit: Payer: Self-pay

## 2019-09-29 DIAGNOSIS — M5416 Radiculopathy, lumbar region: Secondary | ICD-10-CM

## 2019-09-29 NOTE — Therapy (Signed)
[place], [place], [address] Phone: [phone]   Fax:  [phone]  Physical Therapy Treatment  Patient Details  Name: Lisa Tran MRN: [id number] Date of Birth: [birth year] Referring Provider (PT): Emeterio Reeve DO   Encounter Date: 09/29/2019  PT End of Session - 09/29/19 0806    Visit Number  3    Number of Visits  12    Date for PT Re-Evaluation  10/27/19    PT Start Time  0801    PT Stop Time  [id number]    PT Time Calculation (min)  43 min    Activity Tolerance  Patient tolerated treatment well;No increased pain    Behavior During Therapy  WFL for tasks assessed/performed       Past Medical History:  Diagnosis Date  . Allergy   . Asthma     Past Surgical History:  Procedure Laterality Date  . COSMETIC SURGERY    . GASTRIC BYPASS  2003  . KNEE SURGERY  2014    There were no vitals filed for this visit.  Subjective Assessment - 09/29/19 0805    Subjective  "Things are definitely improving".  She reports her shin is not as painful and she is able to do the HEP with greater ease.  Sleep has improving, but not at baseline.    Patient Stated Goals  to avoid surgery    Currently in Pain?  Yes    Pain Score  4     Pain Location  Leg   shin   Pain Orientation  Left    Pain Descriptors / Indicators  Aching    Aggravating Factors   unsure    Pain Relieving Factors  heat         [facility] PT Assessment - 09/29/19 0001      Assessment   Medical Diagnosis  acute LBP with sciatica; lumbar radiculopathy    Referring Provider (PT)  Emeterio Reeve DO    Onset Date/Surgical Date  08/25/19    Hand Dominance  Left    Next MD Visit  six weeks    Prior Therapy  yes      [facility] Adult PT Treatment/Exercise - 09/29/19 0001      Self-Care   Self-Care  Posture    Posture  Pt educated on posture and core engagement with chores like laundry, vacuuming, and picking up groceries.  Pt shown  pictures and demonstrations of activity simulation; pt returned demo and verbalized understanding (including golfer's lift and deep squat).       Therapeutic Activites    Therapeutic Activities  --      Lumbar Exercises: Stretches   Prone on Elbows Stretch  1 rep;20 seconds    Press Ups  5 reps;5 seconds      Lumbar Exercises: Standing   Other Standing Lumbar Exercises  deep squat with core engaged x 5 reps throughout session.       Lumbar Exercises: Seated   Sit to Stand  5 reps   with core engaged. Trial of staggered stance with Rt leg back.      Lumbar Exercises: Supine   Ab Set  5 reps;5 seconds    Bent Knee Raise  2 seconds   12 reps    Bridge Limitations  increased pain in Lt ant hip - 3 reps as trial.       Manual Therapy   Manual Therapy  Soft tissue mobilization;Taping    Soft  tissue mobilization  gentle IASTM to Lt medial shin to decrease fascial strictions.     [place]  I strip of reg Rock tape applied to Lt medial ant shin with 10% stretch to decompress tissue and increase proprioception.      Nu Step L4-5 (legs only) x 6 min - PTA present to discuss progress and monitor.         PT Education - 09/29/19 [phone]    Education Details  kinesiology tape info.    Person(s) Educated  Patient    Methods  Explanation;Handout    Comprehension  Verbalized understanding       PT Short Term Goals - 09/15/19 1623      PT SHORT TERM GOAL #1   Title  Patient able to cetralize LLE sx to low back    Time  3    Period  Weeks    Status  New    Target Date  10/06/19        PT Long Term Goals - 09/15/19 1615      PT LONG TERM GOAL #1   Title  Ind with HEP to prevent further injury    Time  6    Period  Weeks    Status  New    Target Date  10/27/19      PT LONG TERM GOAL #2   Title  Patient able to perform ADLS with [id number] less pain in low back.    Time  6    Period  Weeks    Status  New      PT LONG TERM GOAL #3    Title  Patient able to perform ADLS and her normal work out routine without radicular sx into LLE.      PT LONG TERM GOAL #4   Title  Pt to demo 4+/5 or better strength in BLE to improve function and prevent falls.    Time  6    Period  Weeks    Status  New            Plan - 09/29/19 [id number]    Clinical Impression Statement  Pt able to tolerate press ups for first time, without pain (some discomfort in Lt ant hip). Time spent educating pt regarding body mechanics and core engagement for chores around house; pt verbalized understanding and returned demo. Pt reported reduction in pain in shin with application of ktape. Pt making gradual progress towards goals.    Personal Factors and Comorbidities  Comorbidity 2    Comorbidities  asthma, obesity    Stability/Clinical Decision Making  Evolving/Moderate complexity    Rehab Potential  Good    PT Frequency  2x / week    PT Duration  6 weeks    PT Treatment/Interventions  ADLs/Self Care Home Management;Cryotherapy;Electrical Stimulation;Moist Heat;Traction;Therapeutic activities;Therapeutic exercise;Neuromuscular re-education;Patient/family education;Dry needling;Manual techniques;Taping    PT Next Visit Plan  ADL modifications/body mechanics, extension protocol, LE strengthening    PT Home Exercise Plan  [email or web address]    Consulted and Agree with Plan of Care  Patient       Patient will benefit from skilled therapeutic intervention in order to improve the following deficits and impairments:  Decreased range of motion, Decreased activity tolerance, Pain, Impaired flexibility, Decreased strength, Postural dysfunction  Visit Diagnosis: Radiculopathy, lumbar region     Problem List Patient Active Problem List   Diagnosis Date Noted  . Fibroids  10/15/2017  . 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 Cervical paraspinal muscle spasm 10/05/2015  . Iron deficiency 03/16/2015  . Other specified postprocedural  states 02/25/2015  . Morbid obesity ([place]) 02/25/2015   Kerin Perna, PTA 09/29/19 12:26 PM  [place], [place], [address] Phone: [phone]   Fax:  [phone]  Name: Lisa Tran MRN: [id number] Date of Birth: [birth year]

## 2019-09-29 NOTE — Patient Instructions (Signed)

## 2019-10-01 ENCOUNTER — Encounter: Payer: Self-pay | Admitting: Physical Therapy

## 2019-10-06 ENCOUNTER — Ambulatory Visit (INDEPENDENT_AMBULATORY_CARE_PROVIDER_SITE_OTHER): Payer: BLUE CROSS/BLUE SHIELD | Admitting: Physical Therapy

## 2019-10-06 ENCOUNTER — Encounter: Payer: Self-pay | Admitting: Physical Therapy

## 2019-10-06 ENCOUNTER — Other Ambulatory Visit: Payer: Self-pay

## 2019-10-06 DIAGNOSIS — M5416 Radiculopathy, lumbar region: Secondary | ICD-10-CM

## 2019-10-06 NOTE — Therapy (Signed)
[place], [place], [address] Phone: [phone]   Fax:  [phone]  Physical Therapy Treatment  Patient Details  Name: Lisa Tran MRN: [id number] Date of Birth: [birth year] Referring Provider (PT): Emeterio Reeve DO   Encounter Date: 10/06/2019  PT End of Session - 10/06/19 0757    Visit Number  4    Number of Visits  12    Date for PT Re-Evaluation  10/27/19    PT Start Time  0800    PT Stop Time  [id number]    PT Time Calculation (min)  44 min    Activity Tolerance  Patient tolerated treatment well;No increased pain    Behavior During Therapy  WFL for tasks assessed/performed       Past Medical History:  Diagnosis Date  . Allergy   . Asthma     Past Surgical History:  Procedure Laterality Date  . COSMETIC SURGERY    . GASTRIC BYPASS  2003  . KNEE SURGERY  2014    There were no vitals filed for this visit.  Subjective Assessment - 10/06/19 0802    Subjective  No pain in my back just a little ache 1/10, Shin area 2-3/10. The tape and treatment definitely helped.    Pertinent History  asthma, obesity    Diagnostic tests  xrays; MRI - L3/4 disc bulge on left affecting L3 nerve root    Patient Stated Goals  to avoid surgery    Currently in Pain?  Yes    Pain Score  3     Pain Location  Leg    Pain Orientation  Left    Pain Descriptors / Indicators  Aching                       [facility] Adult PT Treatment/Exercise - 10/06/19 0001      Lumbar Exercises: Stretches   Hip Flexor Stretch  Left;2 reps;30 seconds      Lumbar Exercises: Aerobic   Nustep  L4-5: legs only - 6 min       Lumbar Exercises: Standing   Lifting  From waist;From floor    Lifting Weights (lbs)  17    Lifting Limitations  weakness in LLE      Lumbar Exercises: Seated   Sit to Stand  5 reps   with core engaged.      Lumbar Exercises: Supine   Ab Set  5 reps;5 seconds    Bent Knee Raise  2  seconds   12 reps    Bridge Limitations  4 reps increases tinglling into shin      Lumbar Exercises: Prone   Other Prone Lumbar Exercises  prone lying abolishes LLE tingling in shin, POE increases tingling then prone lying abolishes    Other Prone Lumbar Exercises  prone press ups x 10 (after mobs)      Manual Therapy   Manual Therapy  Joint mobilization;Taping    Manual therapy comments  --    Joint Mobilization  PA mobs Gd III left lumbar no pain just pressure; able to do POE and press ups without LE sx after    Kinesiotex  IT sales professional  I strip of reg Rock tape applied to Lt medial ant shin with 10% stretch to decompress tissue and increase proprioception.  PT Short Term Goals - 10/06/19 0806      PT SHORT TERM GOAL #1   Title  Patient able to cetralize LLE sx to low back    Status  On-going        PT Long Term Goals - 10/06/19 0806      PT LONG TERM GOAL #1   Title  Ind with HEP to prevent further injury    Status  On-going      PT LONG TERM GOAL #2   Title  Patient able to perform ADLS with [id number] less pain in low back.    Baseline  10-15% better    Status  On-going      PT LONG TERM GOAL #3   Title  Patient able to perform ADLS and her normal work out routine without radicular sx into LLE.    Status  On-going            Plan - 10/06/19 1434    Clinical Impression Statement  Patient presents with reports of improvements since last visit. She has been able to tolerate prone on elbows with just some pulliing in groin. We worked on lifting today using 17# box. Patient demonstrated good mechanics, but reported feeling weak in LLE. She initially had tingling in left lower leg and was able to abolish with prone lying. After PA mobs to lumbar spine pt could tolerate press ups with no tingling in LLE. Pt contnues to have tightness in hip flexors, left greater than right.    Comorbidities  asthma, obesity    PT  Treatment/Interventions  ADLs/Self Care Home Management;Cryotherapy;Electrical Stimulation;Moist Heat;Traction;Therapeutic activities;Therapeutic exercise;Neuromuscular re-education;Patient/family education;Dry needling;Manual techniques;Taping    PT Next Visit Plan  ADL modifications/body mechanics, extension protocol, LE strengthening       Patient will benefit from skilled therapeutic intervention in order to improve the following deficits and impairments:  Decreased range of motion, Decreased activity tolerance, Pain, Impaired flexibility, Decreased strength, Postural dysfunction  Visit Diagnosis: Radiculopathy, lumbar region     Problem List Patient Active Problem List   Diagnosis Date Noted  . Fibroids 10/15/2017  . 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 Cervical paraspinal muscle spasm 10/05/2015  . Iron deficiency 03/16/2015  . Other specified postprocedural states 02/25/2015  . Morbid obesity ([place]) 02/25/2015    Madelyn Flavors PT 10/06/2019, 4:20 PM  [facility] [place], [place], [address] Phone: [phone]   Fax:  [phone]  Name: Lisa Tran MRN: [id number] Date of Birth: [birth year]

## 2019-10-08 ENCOUNTER — Encounter: Payer: Self-pay | Admitting: Physical Therapy

## 2019-10-13 ENCOUNTER — Ambulatory Visit (INDEPENDENT_AMBULATORY_CARE_PROVIDER_SITE_OTHER): Payer: BLUE CROSS/BLUE SHIELD | Admitting: Physical Therapy

## 2019-10-13 ENCOUNTER — Encounter: Payer: Self-pay | Admitting: Physical Therapy

## 2019-10-13 ENCOUNTER — Other Ambulatory Visit: Payer: Self-pay

## 2019-10-13 DIAGNOSIS — M5416 Radiculopathy, lumbar region: Secondary | ICD-10-CM | POA: Diagnosis not present

## 2019-10-13 NOTE — Therapy (Signed)
[place], [place], [address] Phone: [phone]   Fax:  [phone]  Physical Therapy Treatment  Patient Details  Name: Lisa Tran MRN: [id number] Date of Birth: 1976/04/14 Referring Provider (PT): Emeterio Reeve DO   Encounter Date: 10/13/2019  PT End of Session - 10/13/19 0758    Visit Number  5    Number of Visits  12    Date for PT Re-Evaluation  10/27/19    PT Start Time  0758    PT Stop Time  0840    PT Time Calculation (min)  42 min    Activity Tolerance  Patient tolerated treatment well;No increased pain    Behavior During Therapy  WFL for tasks assessed/performed       Past Medical History:  Diagnosis Date  . Allergy   . Asthma     Past Surgical History:  Procedure Laterality Date  . COSMETIC SURGERY    . GASTRIC BYPASS  2003  . KNEE SURGERY  2014    There were no vitals filed for this visit.  Subjective Assessment - 10/13/19 0803    Subjective  Pt reports her back pain is now intermittent.  1/10 at best, 6/10 at worst.  The ktape has helped "give support".  she was able to walk 1 mile without increased pain this weekend.    Pertinent History  asthma, obesity    Diagnostic tests  xrays; MRI - L3/4 disc bulge on left affecting L3 nerve root    Patient Stated Goals  to avoid surgery    Currently in Pain?  Yes    Pain Score  3     Pain Location  Back    Pain Orientation  Right    Pain Descriptors / Indicators  Aching    Pain Radiating Towards  into LLE    Aggravating Factors   chores, prolonged sitting    Pain Relieving Factors  heat, stretches.         [facility] PT Assessment - 10/13/19 0001      Assessment   Medical Diagnosis  acute LBP with sciatica; lumbar radiculopathy    Referring Provider (PT)  Emeterio Reeve DO    Onset Date/Surgical Date  08/25/19    Hand Dominance  Left    Prior Therapy  yes      Strength   Left Hip Flexion  5/5    Left Hip  Extension  3+/5    Right/Left Knee  Right;Left    Right Knee Flexion  5/5    Left Knee Flexion  4+/5       [facility] Adult PT Treatment/Exercise - 10/13/19 0001      Lumbar Exercises: Stretches   Hip Flexor Stretch  Left;2 reps;60 seconds    Prone on Elbows Stretch  1 rep;20 seconds    Press Ups  5 reps;5 seconds    Gastroc Stretch  1 rep   15 sec on incline board     Lumbar Exercises: Aerobic   Nustep  L4: legs only - 5 min       Lumbar Exercises: Standing   Other Standing Lumbar Exercises  at counter:  hip ext with opp arm lift (off of cabinet, overhead) x 5 reps each side, with cues for form.       Lumbar Exercises: Seated   Sit to Stand  5 reps   with core engaged.    Sit to Stand Limitations  slow eccentric lowering to  chair, arms out in front.  Good form.       Lumbar Exercises: Prone   Straight Leg Raise  5 reps    Opposite Arm/Leg Raise  Right arm/Left leg;Left arm/Right leg;5 reps;2 seconds      Lumbar Exercises: Quadruped   Straight Leg Raises Limitations  2 reps each leg, toes on mat;  1 additional rep each side with arm (challenging)      Kinesiotix   [place]  I strip of reg Rock tape applied to Lt medial ant shin with 10% stretch to decompress tissue and increase proprioception.                PT Short Term Goals - 10/06/19 0806      PT SHORT TERM GOAL #1   Title  Patient able to cetralize LLE sx to low back    Status  On-going        PT Long Term Goals - 10/13/19 0835      PT LONG TERM GOAL #1   Title  Ind with HEP to prevent further injury    Status  On-going      PT LONG TERM GOAL #2   Title  Patient able to perform ADLS with 75% less pain in low back.    Baseline  >50% improvement - 10/13/19    Status  On-going      PT LONG TERM GOAL #3   Title  Patient able to perform ADLS and her normal work out routine without radicular sx into LLE.    Status  On-going      PT LONG TERM GOAL #4   Title  Pt to demo 4+/5 or better strength in BLE  to improve function and prevent falls.    Time  6    Period  Weeks    Status  Partially Met            Plan - 10/13/19 0820    Clinical Impression Statement  Pt reporting overall reduction in symptoms.  Pt demonstrated improved strength in Lt hip flexion, continued weakness in Lt hip ext.  Pt able to tolerate new prone and exercises without increase in pain.  Pt making good gains towards goals.    Comorbidities  asthma, obesity    Rehab Potential  Good    PT Frequency  2x / week    PT Duration  6 weeks    PT Treatment/Interventions  ADLs/Self Care Home Management;Cryotherapy;Electrical Stimulation;Moist Heat;Traction;Therapeutic activities;Therapeutic exercise;Neuromuscular re-education;Patient/family education;Dry needling;Manual techniques;Taping    PT Next Visit Plan  ADL modifications/body mechanics, extension protocol, LE strengthening    PT Home Exercise Plan  [email or web address]       Patient will benefit from skilled therapeutic intervention in order to improve the following deficits and impairments:  Decreased range of motion, Decreased activity tolerance, Pain, Impaired flexibility, Decreased strength, Postural dysfunction  Visit Diagnosis: Radiculopathy, lumbar region     Problem List Patient Active Problem List   Diagnosis Date Noted  . Fibroids 10/15/2017  . 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 Cervical paraspinal muscle spasm 10/05/2015  . Iron deficiency 03/16/2015  . Other specified postprocedural states 02/25/2015  . Morbid obesity ([place]) 02/25/2015   Kerin Perna, PTA 10/13/19 8:46 AM  [facility] [place], [place], [address] Phone: [phone]   Fax:  [phone]  Name: Lisa Tran MRN: [id number] Date of Birth: 21-Nov-1975

## 2019-10-20 ENCOUNTER — Ambulatory Visit (INDEPENDENT_AMBULATORY_CARE_PROVIDER_SITE_OTHER): Payer: BLUE CROSS/BLUE SHIELD | Admitting: Physical Therapy

## 2019-10-20 ENCOUNTER — Other Ambulatory Visit: Payer: Self-pay

## 2019-10-20 DIAGNOSIS — M5416 Radiculopathy, lumbar region: Secondary | ICD-10-CM

## 2019-10-20 NOTE — Therapy (Signed)
[place], [place], [address] Phone: [phone]   Fax:  [phone]  Physical Therapy Treatment  Patient Details  Name: Lisa Tran MRN: [id number] Date of Birth: November 27, 1975 Referring Provider (PT): Emeterio Reeve DO   Encounter Date: 10/20/2019  PT End of Session - 10/20/19 0852    Visit Number  6    Number of Visits  12    Date for PT Re-Evaluation  10/27/19    PT Start Time  0803    PT Stop Time  0845    PT Time Calculation (min)  42 min    Activity Tolerance  Patient tolerated treatment well;No increased pain    Behavior During Therapy  WFL for tasks assessed/performed       Past Medical History:  Diagnosis Date  . Allergy   . Asthma     Past Surgical History:  Procedure Laterality Date  . COSMETIC SURGERY    . GASTRIC BYPASS  2003  . KNEE SURGERY  2014    There were no vitals filed for this visit.  Subjective Assessment - 10/20/19 0805    Subjective  Pt reports she rode stationary bike 2-3x for 30 min, and walked 1x.  She has ordered tape so that she can start taping her leg. No longer having pain in leg for 1 wk now. Biggest complaint is the weakness in her Lt leg; it is improving however.    Currently in Pain?  Yes    Pain Score  3     Pain Location  Back    Pain Orientation  Lower    Pain Descriptors / Indicators  Aching    Aggravating Factors   chores, prolonged sitting    Pain Relieving Factors  heat, stretches.         [facility] PT Assessment - 10/20/19 0001      Assessment   Medical Diagnosis  acute LBP with sciatica; lumbar radiculopathy    Referring Provider (PT)  Emeterio Reeve DO    Onset Date/Surgical Date  08/25/19    Hand Dominance  Left    Prior Therapy  yes      Strength   Left Hip Extension  4/5        [facility] Adult PT Treatment/Exercise - 10/20/19 0001      Self-Care   Other Self-Care Comments   pt educated on self massage with ball to  low back to decrease fascial tightness and pain; pt returned demo.  Pt educated on self application of rock tape to Lt shin; pt returned demo with cues.       Lumbar Exercises: Stretches   Hip Flexor Stretch  Left;Right;2 reps;30 seconds   seated   Press Ups  5 reps;5 seconds    Gastroc Stretch  Right;Left;3 reps;20 seconds      Lumbar Exercises: Aerobic   Nustep  L5: legs only x 5 min     Other Aerobic Exercise  single laps around gym in between exercises to ease fatigue of LE.       Lumbar Exercises: Standing   Other Standing Lumbar Exercises  at counter:  hip ext with opp arm lift (off of cabinet, overhead) x 5 reps each side, with cues for form.     Other Standing Lumbar Exercises  SLS on Lt x 8 sec, x 12 sec    increased tingles in LLE; resolved with rest     Knee/Hip Exercises: Standing   Lateral Step  Up  Left;1 set;5 reps    Forward Step Up  Left;1 set;10 reps;Step Height: 6"             PT Education - 10/20/19 1053    Education Details  HEP, updated.    Person(s) Educated  Patient    Methods  Explanation;Handout;Demonstration;Verbal cues    Comprehension  Verbalized understanding;Returned demonstration       PT Short Term Goals - 10/20/19 1047      PT SHORT TERM GOAL #1   Title  Patient able to centralize LLE sx to low back    Status  Achieved        PT Long Term Goals - 10/20/19 1048      PT LONG TERM GOAL #1   Title  Ind with HEP to prevent further injury    Status  On-going      PT LONG TERM GOAL #2   Title  Patient able to perform ADLS with 09% less pain in low back.    Baseline  70-75% improvement - 10/20/19    Status  Achieved      PT LONG TERM GOAL #3   Title  Patient able to perform ADLS and her normal work out routine without radicular sx into LLE.    Status  On-going      PT LONG TERM GOAL #4   Title  Pt to demo 4+/5 or better strength in BLE to improve function and prevent falls.    Time  6    Period  Weeks    Status  Partially Met             Plan - 10/20/19 3235    Clinical Impression Statement  Pt now reporting centralization of symptoms; now just experiencing low back pain.  Pt did have some LLE radicular symptoms with SLS exercise; resolved with rest during hip flexor stretch.  she tolerated all other exercises well, noting only fatigue in LLE.  She has now met STG #1 and  LTG#2.    Rehab Potential  Good    PT Frequency  2x / week    PT Duration  6 weeks    PT Treatment/Interventions  ADLs/Self Care Home Management;Cryotherapy;Electrical Stimulation;Moist Heat;Traction;Therapeutic activities;Therapeutic exercise;Neuromuscular re-education;Patient/family education;Dry needling;Manual techniques;Taping    PT Next Visit Plan  assess goals and need for additional visits (insurance approved first 7 visits)    PT Home Exercise Plan  [email or web address]    Consulted and Agree with Plan of Care  Patient       Patient will benefit from skilled therapeutic intervention in order to improve the following deficits and impairments:  Decreased range of motion, Decreased activity tolerance, Pain, Impaired flexibility, Decreased strength, Postural dysfunction  Visit Diagnosis: Radiculopathy, lumbar region     Problem List Patient Active Problem List   Diagnosis Date Noted  . Fibroids 10/15/2017  . 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 Cervical paraspinal muscle spasm 10/05/2015  . Iron deficiency 03/16/2015  . Other specified postprocedural states 02/25/2015  . Morbid obesity ([place]) 02/25/2015   Kerin Perna, PTA 10/20/19 10:54 AM  [place], [place], [address] Phone: [phone]   Fax:  [phone]  Name: Lisa Tran MRN: [id number] Date of Birth: Dec 25, 1975

## 2019-10-20 NOTE — Patient Instructions (Signed)
Access Code: [email or web address]  URL: [email or web address]  Date: 10/20/2019  Prepared by: Kerin Perna   Program Notes  Self massage with ball to low back musculature.   Exercises  Prone Hip Extension with Bent Knee - Two Pillows - 10 reps - 3 sets - 1x daily - 7x weekly  Step Up - 10 reps - 1 sets - 1x daily - 7x weekly  Gastroc Stretch on Step - 3 reps - 1 sets - 20 sec hold - 2x daily - 7x weekly  Standing Hip Extension with Counter Support - 10 reps - 1 sets - 1x daily - 7x weekly  Prone Press Up - 5 reps - 1 sets - 5 hold - 1x daily - 7x weekly

## 2019-11-03 ENCOUNTER — Other Ambulatory Visit: Payer: Self-pay

## 2019-11-03 ENCOUNTER — Ambulatory Visit (INDEPENDENT_AMBULATORY_CARE_PROVIDER_SITE_OTHER): Payer: BLUE CROSS/BLUE SHIELD | Admitting: Physical Therapy

## 2019-11-03 ENCOUNTER — Encounter: Payer: Self-pay | Admitting: Physical Therapy

## 2019-11-03 DIAGNOSIS — M5416 Radiculopathy, lumbar region: Secondary | ICD-10-CM | POA: Diagnosis not present

## 2019-11-03 NOTE — Therapy (Addendum)
[place], [place], [address] Phone: [phone]   Fax:  [phone]  Physical Therapy Treatment and Discharge Summary  Patient Details  Name: Lisa Tran MRN: [id number] Date of Birth: 04/18/1976 Referring Provider (PT): Emeterio Reeve DO   Encounter Date: 11/03/2019  PT End of Session - 11/03/19 0758    Visit Number  7    Number of Visits  12    Date for PT Re-Evaluation  10/27/19    PT Start Time  0800    PT Stop Time  5320    PT Time Calculation (min)  44 min    Activity Tolerance  Patient tolerated treatment well;No increased pain    Behavior During Therapy  WFL for tasks assessed/performed       Past Medical History:  Diagnosis Date  . Allergy   . Asthma     Past Surgical History:  Procedure Laterality Date  . COSMETIC SURGERY    . GASTRIC BYPASS  2003  . KNEE SURGERY  2014    There were no vitals filed for this visit.  Subjective Assessment - 11/03/19 0802    Subjective  Patient feeling well overall. She had some achiness in her low back yesterday and slight tingling in her anterior lower leg. She had been walkng. She also felt a little twinge in the left hip flexor a couple of times.    Pertinent History  asthma, obesity    Diagnostic tests  xrays; MRI - L3/4 disc bulge on left affecting L3 nerve root    Patient Stated Goals  to avoid surgery    Currently in Pain?  No/denies         [facility] PT Assessment - 11/03/19 0001      Strength   Right Hip Flexion  4/5    Left Hip Extension  5/5    Left Knee Flexion  5/5                   [facility] Adult PT Treatment/Exercise - 11/03/19 0001      Self-Care   Self-Care  Other Self-Care Comments    Other Self-Care Comments   pt educated on self MFR to ant tib daily      Lumbar Exercises: Stretches   Active Hamstring Stretch  Left;1 rep;20 seconds    Hip Flexor Stretch  Left;1 rep    Hip Flexor Stretch  Limitations  with right SB and heel raises x 10    Quad Stretch  Left;2 reps;60 seconds    ITB Stretch  Left;1 rep;20 seconds    ITB Stretch Limitations  with strap    Other Lumbar Stretch Exercise  attempted seated ant tib stretch    Other Lumbar Stretch Exercise  crescent pose with SB and heel raises x 10 bil       Lumbar Exercises: Aerobic   Elliptical  L2 x 3 min    Nustep  L5: legs only x 5 min       Lumbar Exercises: Standing   Other Standing Lumbar Exercises  standing march x 10 bil       Lumbar Exercises: Prone   Straight Leg Raise  5 seconds;10 reps    Opposite Arm/Leg Raise  Right arm/Left leg;Left arm/Right leg;5 reps;2 seconds      Manual Therapy   Manual Therapy  Soft tissue mobilization    Soft tissue mobilization  IASTM to left ant tibialis  PT Education - 11/03/19 1719    Education Details  HEP progressed; MFR using dowel for ant tib    Person(s) Educated  Patient    Methods  Explanation;Demonstration;Handout    Comprehension  Verbalized understanding;Returned demonstration       PT Short Term Goals - 10/20/19 1047      PT SHORT TERM GOAL #1   Title  Patient able to centralize LLE sx to low back    Status  Achieved        PT Long Term Goals - 11/03/19 0805      PT LONG TERM GOAL #1   Title  Ind with HEP to prevent further injury    Status  Achieved      PT LONG TERM GOAL #2   Title  Patient able to perform ADLS with 44% less pain in low back.    Status  Achieved      PT LONG TERM GOAL #3   Title  Patient able to perform ADLS and her normal work out routine without radicular sx into LLE.    Baseline  still hasn't resumed normal work out routine    Status  Partially Met      PT LONG TERM GOAL #4   Title  Pt to demo 4+/5 or better strength in BLE to improve function and prevent falls.    Baseline  hip flexion still 4/5 on left    Status  Partially Met            Plan - 11/03/19 1722    Clinical Impression Statement   Patient presents with continued improvements overall. She has met or partially met all her LTGs. She continues to have some weakness in her left hip flexor as well as tightness and still has not returned to her normal work out routine. Overall strength has increased and pain has decreased. She still gets intermittent tingling in her Lt ant shin. This too is tight and self MFR was recommended. Patient pleased with current level of function. Will hold PT for 2 weeks, but expect d/c.    PT Frequency  2x / week    PT Duration  6 weeks    PT Treatment/Interventions  ADLs/Self Care Home Management;Cryotherapy;Electrical Stimulation;Moist Heat;Traction;Therapeutic activities;Therapeutic exercise;Neuromuscular re-education;Patient/family education;Dry needling;Manual techniques;Taping    PT Next Visit Plan  hold 2 weeks then d/c if no return.    PT Home Exercise Plan  [email or web address]       Patient will benefit from skilled therapeutic intervention in order to improve the following deficits and impairments:  Decreased range of motion, Decreased activity tolerance, Pain, Impaired flexibility, Decreased strength, Postural dysfunction  Visit Diagnosis: Radiculopathy, lumbar region     Problem List Patient Active Problem List   Diagnosis Date Noted  . Fibroids 10/15/2017  . 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 Cervical paraspinal muscle spasm 10/05/2015  . Iron deficiency 03/16/2015  . Other specified postprocedural states 02/25/2015  . Morbid obesity ([place]) 02/25/2015    Madelyn Flavors PT 11/03/2019, 5:30 PM  [facility] [place], [place], [address] Phone: [phone]   Fax:  [phone]  Name: Lisa Tran MRN: [id number] Date of Birth: [birth year]  PHYSICAL THERAPY DISCHARGE SUMMARY  Visits from Start of Care: 7  Current functional level related to goals / functional outcomes: See  above   Remaining deficits: See above   Education / Equipment: HEP Plan: Patient agrees to discharge.  Patient goals were partially met. Patient is being discharged due to being pleased with the current functional level.  ?????    Madelyn Flavors, PT 11/25/19 7:12 PM  [facility] at [place], [place] [address]  [phone] (office) [phone] (fax)

## 2019-11-03 NOTE — Patient Instructions (Signed)
Access Code: [email or web address]  URL: [email or web address]  Date: 11/03/2019  Prepared by: Madelyn Flavors   Program Notes  Self massage with ball to low back musculature.   Exercises Prone Hip Extension with Bent Knee - Two Pillows - 10 reps - 3 sets - 1x daily - 7x weekly Step Up - 10 reps - 1 sets - 1x daily - 7x weekly Gastroc Stretch on Step - 3 reps - 1 sets - 20 sec hold - 2x daily - 7x weekly Standing Hip Extension with Counter Support - 10 reps - 1 sets - 1x daily - 7x weekly Prone Press Up - 5 reps - 1 sets - 5 hold - 1x daily - 7x weekly Prone Alternating Arm and Leg Lifts - 10 reps - 3 sets - 5 sec hold                            - 1x daily - 7x weekly Warrior I - 10 reps - 1 sets - 1x daily - 7x weekly Standing March with Counter Support - 10 reps - 3 sets - 1x daily - 7x weekly Prone Quadriceps Stretch with Strap - 3 reps - 1 sets - 60 sec hold - 2-3x daily - 7x weekly

## 2019-12-29 DIAGNOSIS — L853 Xerosis cutis: Secondary | ICD-10-CM | POA: Diagnosis not present

## 2019-12-29 DIAGNOSIS — L700 Acne vulgaris: Secondary | ICD-10-CM | POA: Diagnosis not present

## 2020-09-15 ENCOUNTER — Other Ambulatory Visit: Payer: Self-pay | Admitting: Osteopathic Medicine

## 2020-09-15 DIAGNOSIS — Z1231 Encounter for screening mammogram for malignant neoplasm of breast: Secondary | ICD-10-CM

## 2020-09-23 ENCOUNTER — Encounter: Payer: Self-pay | Admitting: Osteopathic Medicine

## 2020-09-23 ENCOUNTER — Ambulatory Visit (INDEPENDENT_AMBULATORY_CARE_PROVIDER_SITE_OTHER): Payer: BLUE CROSS/BLUE SHIELD | Admitting: Osteopathic Medicine

## 2020-09-23 ENCOUNTER — Other Ambulatory Visit: Payer: Self-pay

## 2020-09-23 DIAGNOSIS — Z800 Family history of malignant neoplasm of digestive organs: Secondary | ICD-10-CM | POA: Diagnosis not present

## 2020-09-23 DIAGNOSIS — E611 Iron deficiency: Secondary | ICD-10-CM

## 2020-09-23 DIAGNOSIS — Z0000 Encounter for general adult medical examination without abnormal findings: Secondary | ICD-10-CM | POA: Diagnosis not present

## 2020-09-23 DIAGNOSIS — R002 Palpitations: Secondary | ICD-10-CM

## 2020-09-23 DIAGNOSIS — Z1211 Encounter for screening for malignant neoplasm of colon: Secondary | ICD-10-CM

## 2020-09-23 DIAGNOSIS — E559 Vitamin D deficiency, unspecified: Secondary | ICD-10-CM

## 2020-09-23 NOTE — Progress Notes (Signed)
HPI: Lisa Tran is a 44 y.o. female who  has a past medical history of Allergy and Asthma.  she presents to [facility] today, 09/23/20,  for chief complaint of:  Annual Physical   Heart flutter . Context: pt with history of anemia and anxiety; pt uses energy/caffine powder daily in the morning; no personal or fam hx of heart disease or a fib . Quality: fluttering sensation  . Severity: 1-2 times per week; last less than 1 min; severity has not changed since onset . Duration: a couple of months . Timing: sometimes occurs when she feels anxious or stressed; other times occurs with no inciting events; happens at any time of day, lasts < 1 minute per episode once per week  . Modifying factors: nothing . Assoc signs/symptoms:  o Denies chest pain, shortness of breath, dizziness, orthostatic dizziness, chest pressure    Past medical, surgical, social and family history reviewed:  Patient Active Problem List   Diagnosis Date Noted  . Family history of colon cancer 09/23/2020  . Fibroids 10/15/2017  . 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 Cervical paraspinal muscle spasm 10/05/2015  . Iron deficiency 03/16/2015  . Other specified postprocedural states 02/25/2015  . Morbid obesity ([place]) 02/25/2015    Past Surgical History:  Procedure Laterality Date  . COSMETIC SURGERY    . GASTRIC BYPASS  2003  . KNEE SURGERY  2014    Social History   Tobacco Use  . Smoking status: Never Smoker  . Smokeless tobacco: Never Used  Substance Use Topics  . Alcohol use: Yes    Comment: occ    Family History  Problem Relation Age of Onset  . Diabetes Maternal Aunt   . Diabetes Maternal Grandfather   . Cancer Maternal Grandfather        prostate  . Hypertension Maternal Aunt   . Asthma Mother      Current medication list and allergy/intolerance information reviewed:    Current Outpatient Medications   Medication Sig Dispense Refill  . albuterol (PROVENTIL HFA;VENTOLIN HFA) 108 (90 Base) MCG/ACT inhaler Inhale 2 puffs into the lungs every 6 (six) hours as needed. For shortness of breath or wheezing 1 Inhaler 3  . AMBULATORY NON FORMULARY MEDICATION Knee-high, medium/grade II compression, graduated compression stockings. Large circ Apply to lower extremities. 1 each 0  . cetirizine (ZYRTEC) 10 MG tablet Take 10 mg by mouth daily.    . Cholecalciferol (VITAMIN D3) 1000 units CAPS Take by mouth.    . ferrous fumarate-iron polysaccharide complex (TANDEM) 162-115.2 MG CAPS capsule Take by mouth.    . Multiple Vitamin (MULTIVITAMIN WITH MINERALS) TABS Take 1 tablet by mouth daily.    . vitamin B-12 (CYANOCOBALAMIN) 100 MCG tablet Take 100 mcg by mouth daily. (Patient not taking: Reported on 09/23/2020)    . Vitamin D, Ergocalciferol, (DRISDOL) 1.25 MG (50000 UT) CAPS capsule Take 1 capsule (50,000 Units total) by mouth every 7 (seven) days. Take for 12 total doses(weeks) (Patient not taking: No sig reported) 12 capsule 0   Current Facility-Administered Medications  Medication Dose Route Frequency Provider Last Rate Last Admin  . levonorgestrel (MIRENA) 20 MCG/24HR IUD   Intrauterine Once Emily Filbert, MD        Allergies  Allergen Reactions  . Shellfish Allergy Anaphylaxis      Review of Systems:  Cardiac: No  chest pain, No  pressure, + palpitations  Respiratory:  No  shortness of  breath.   Gastrointestinal: No  diarrhea, No  constipation  Endocrine: No cold intolerance,  No heat intolerance. No polyuria  Neurologic: No  weakness, No  dizzines  Psychiatric: No  concerns with depression, +  concerns with anxiety    Exam:  BP 131/90 (BP Location: Left Arm, Patient Position: Sitting, Cuff Size: Large)   Pulse 82   Temp 98.3 F (36.8 C) (Oral)   Wt (!) 305 lb 0.6 oz (138.4 kg)   BMI 49.99 kg/m   Constitutional: VS see above. General Appearance: alert, well-developed,  well-nourished, NAD  Neck: No masses, trachea midline. No thyroid enlargement. No tenderness/mass appreciated. No lymphadenopathy  Respiratory: Normal respiratory effort. no wheeze, no rhonchi, no rales  Cardiovascular: S1/S2 normal, no murmur, no rub/gallop auscultated. RRR. No lower extremity edema.  Gastrointestinal: Nontender, no masses. No hepatomegaly, no splenomegaly. No hernia appreciated. Rectal exam deferred.   Musculoskeletal: Gait normal.   Neurological: Normal balance/coordination. No tremor.   Skin: warm, dry, intact. No rash/ulcer  Psychiatric: Normal judgment/insight. Normal mood and affect.    EKG interpretation: Rhythm: sinus No ST/T changes concerning for acute ischemia/infarct     No results found for this or any previous visit (from the past 72 hour(s)).  No results found.   ASSESSMENT/PLAN: Diagnoses of Annual physical exam, Family history of colon cancer, Vitamin D deficiency, Iron deficiency, Palpitations, and Colon cancer screening were pertinent to this visit.   Palpitations  EKG today showed no evidence of atrial fibrillation or other arrhythmia or ischemia   Will set up with 14 day heart monitor if labs are normal   CBC, CMP, TSH   Will consider referral to cardiology pending results  Follow up pending results  Health maintenance   Referral to GI for colonoscopy   CMP, CBC, TSH, lipid panel   Order for mammogram placed  Follow up in 1 year  Iron deficiency anemia  Iron studies  CBC  History of D3 deficiency   vitamin D levels  Orders Placed This Encounter  Procedures  . CBC  . COMPLETE METABOLIC PANEL WITH GFR  . Lipid panel  . TSH  . VITAMIN D 25 Hydroxy (Vit-D Deficiency, Fractures)  . Fe+TIBC+Fer  . Ambulatory referral to Gastroenterology  . EKG 12-Lead    No orders of the defined types were placed in this encounter.   Patient Instructions  Palpitations: EKG today Labs pending If labs normal we will get  you set up for heart monitor  May consider cardiology referral and/or echocardiogram (heart ultrasound) depending on above results.    General Preventive Care  Most recent routine screening labs: ordered today!   Blood pressure goal 140/90 or less.   Tobacco: don't!   Alcohol: responsible moderation is ok for most adults - if you have concerns about your alcohol intake, please talk to me!   Exercise: as tolerated to reduce risk of cardiovascular disease and diabetes. Strength training will also prevent osteoporosis.   Mental health: if need for mental health care (medicines, counseling, other), or concerns about moods, please let me know!   Sexual / Reproductive health: if need for STD testing, or if concerns with libido/pain problems, please let me know! If you need to discuss family planning, please let me know!   Advanced Directive: Living Will and/or Healthcare Power of Attorney recommended for all adults, regardless of age or health.  Vaccines  Flu vaccine: for almost everyone, every fall.   Tetanus booster: due 2029!  COVID vaccine: STRONGLY RECOMMENDED  Cancer screenings   Colon cancer screening: for everyone age 19-75. Colonoscopy referral placed.   Breast cancer screening: mammogram at age 38 every other year at least, and annually after age 28. Referral has been placed!   Cervical cancer screening: Due 2023!  Infection screenings  . HIV: recommended screening at least once age 31-65, more often as needed. . Gonorrhea/Chlamydia: screening as needed. . Hepatitis C: recommended once for everyone age 50-75 Other . Bone Density Test: recommended for women at age 25             Please note: Preventive care issues were addressed today as part of your annual wellness physical, and this care should be covered under your insurance. However there were other medical issues which were also addressed today, and insurance may bill you separately for a "problem-based  visit" for this care: palpitations. Any questions or concerns about charges which may appear on your billing statement should be directed to your insurance company or to [facility] billing department, and they will contact our office if there are further concerns.            Visit summary with medication list and pertinent instructions was printed for patient to review. All questions at time of visit were answered - patient instructed to contact office with any additional concerns or updates. ER/RTC precautions were reviewed with the patient.     Please note: voice recognition software was used to produce this document, and typos may escape review. Please contact Dr. Sheppard Coil for any needed clarifications.     Follow-up plan: Return for RECHECK PENDING RESULTS / IF WORSE OR CHANGE.

## 2020-09-23 NOTE — Patient Instructions (Addendum)
Palpitations: EKG today Labs pending If labs normal we will get you set up for heart monitor  May consider cardiology referral and/or echocardiogram (heart ultrasound) depending on above results.    General Preventive Care  Most recent routine screening labs: ordered today!   Blood pressure goal 140/90 or less.   Tobacco: don't!   Alcohol: responsible moderation is ok for most adults - if you have concerns about your alcohol intake, please talk to me!   Exercise: as tolerated to reduce risk of cardiovascular disease and diabetes. Strength training will also prevent osteoporosis.   Mental health: if need for mental health care (medicines, counseling, other), or concerns about moods, please let me know!   Sexual / Reproductive health: if need for STD testing, or if concerns with libido/pain problems, please let me know! If you need to discuss family planning, please let me know!   Advanced Directive: Living Will and/or Healthcare Power of Attorney recommended for all adults, regardless of age or health.  Vaccines  Flu vaccine: for almost everyone, every fall.   Tetanus booster: due 2029!  COVID vaccine: STRONGLY RECOMMENDED  Cancer screenings   Colon cancer screening: for everyone age 76-75. Colonoscopy referral placed.   Breast cancer screening: mammogram at age 61 every other year at least, and annually after age 53. Referral has been placed!   Cervical cancer screening: Due 2023!  Infection screenings  . HIV: recommended screening at least once age 11-65, more often as needed. . Gonorrhea/Chlamydia: screening as needed. . Hepatitis C: recommended once for everyone age 52-75 Other . Bone Density Test: recommended for women at age 16             Please note: Preventive care issues were addressed today as part of your annual wellness physical, and this care should be covered under your insurance. However there were other medical issues which were also addressed  today, and insurance may bill you separately for a "problem-based visit" for this care: palpitations. Any questions or concerns about charges which may appear on your billing statement should be directed to your insurance company or to [facility] billing department, and they will contact our office if there are further concerns.

## 2020-09-27 LAB — CBC
HCT: 38.5 % (ref 35.0–45.0)
Hemoglobin: 12.7 g/dL (ref 11.7–15.5)
MCH: 29.3 pg (ref 27.0–33.0)
MCHC: 33 g/dL (ref 32.0–36.0)
MCV: 88.9 fL (ref 80.0–100.0)
MPV: 9.3 fL (ref 7.5–12.5)
Platelets: 312 10*3/uL (ref 140–400)
RBC: 4.33 10*6/uL (ref 3.80–5.10)
RDW: 12.7 % (ref 11.0–15.0)
WBC: 4.2 10*3/uL (ref 3.8–10.8)

## 2020-09-27 LAB — IRON,TIBC AND FERRITIN PANEL
%SAT: 34 % (calc) (ref 16–45)
Ferritin: 86 ng/mL (ref 16–232)
Iron: 104 ug/dL (ref 40–190)
TIBC: 310 mcg/dL (calc) (ref 250–450)

## 2020-09-27 LAB — COMPLETE METABOLIC PANEL WITH GFR
AG Ratio: 1.6 (calc) (ref 1.0–2.5)
ALT: 19 U/L (ref 6–29)
AST: 24 U/L (ref 10–35)
Albumin: 4.1 g/dL (ref 3.6–5.1)
Alkaline phosphatase (APISO): 54 U/L (ref 31–125)
BUN: 11 mg/dL (ref 7–25)
CO2: 27 mmol/L (ref 20–32)
Calcium: 9 mg/dL (ref 8.6–10.2)
Chloride: 105 mmol/L (ref 98–110)
Creat: 0.74 mg/dL (ref 0.50–1.10)
GFR, Est African American: 113 mL/min/{1.73_m2} (ref >=60)
GFR, Est Non African American: 97 mL/min/{1.73_m2} (ref >=60)
Globulin: 2.6 g/dL (calc) (ref 1.9–3.7)
Glucose, Bld: 86 mg/dL (ref 65–99)
Potassium: 4.5 mmol/L (ref 3.5–5.3)
Sodium: 140 mmol/L (ref 135–146)
Total Bilirubin: 0.5 mg/dL (ref 0.2–1.2)
Total Protein: 6.7 g/dL (ref 6.1–8.1)

## 2020-09-27 LAB — LIPID PANEL
Cholesterol: 163 mg/dL (ref <200)
HDL: 82 mg/dL (ref >=50)
LDL Cholesterol (Calc): 67 mg/dL (calc)
Non-HDL Cholesterol (Calc): 81 mg/dL (calc) (ref <130)
Total CHOL/HDL Ratio: 2 (calc) (ref <5.0)
Triglycerides: 67 mg/dL (ref <150)

## 2020-09-27 LAB — TSH: TSH: 1.93 mIU/L

## 2020-09-27 LAB — VITAMIN D 25 HYDROXY (VIT D DEFICIENCY, FRACTURES): Vit D, 25-Hydroxy: 43 ng/mL (ref 30–100)

## 2020-09-28 NOTE — Addendum Note (Signed)
Addended by: Maryla Morrow on: 09/28/2020 07:29 AM   Modules accepted: Orders

## 2020-10-08 DIAGNOSIS — J069 Acute upper respiratory infection, unspecified: Secondary | ICD-10-CM | POA: Diagnosis not present

## 2020-10-08 DIAGNOSIS — Z20822 Contact with and (suspected) exposure to covid-19: Secondary | ICD-10-CM | POA: Diagnosis not present

## 2020-10-08 DIAGNOSIS — R0981 Nasal congestion: Secondary | ICD-10-CM | POA: Diagnosis not present

## 2020-11-11 ENCOUNTER — Ambulatory Visit (INDEPENDENT_AMBULATORY_CARE_PROVIDER_SITE_OTHER): Payer: BLUE CROSS/BLUE SHIELD

## 2020-11-11 ENCOUNTER — Other Ambulatory Visit: Payer: Self-pay

## 2020-11-11 DIAGNOSIS — Z1231 Encounter for screening mammogram for malignant neoplasm of breast: Secondary | ICD-10-CM | POA: Diagnosis not present

## 2020-12-07 ENCOUNTER — Encounter: Payer: Self-pay | Admitting: Osteopathic Medicine

## 2021-03-22 DIAGNOSIS — D3131 Benign neoplasm of right choroid: Secondary | ICD-10-CM | POA: Diagnosis not present

## 2021-06-09 ENCOUNTER — Encounter: Payer: Self-pay | Admitting: Osteopathic Medicine

## 2021-07-09 IMAGING — MR MR LUMBAR SPINE W/O CM
4 of 5 series · 25 of 48 positions shown · non-contrast
Comparison: Lumbar radiographs 08/29/2019

CLINICAL DATA: Left leg weakness. Left buttock and groin pain 2
weeks.

EXAM:
MRI LUMBAR SPINE WITHOUT CONTRAST
TECHNIQUE: Multiplanar, multisequence MR imaging of the lumbar spine was
performed. No intravenous contrast was administered.

[Series 6: T2 · sagittal · 4.0mm · 0.81mm/px · 6 of 17 slices shown (1 of 2)]
[im 1/17]
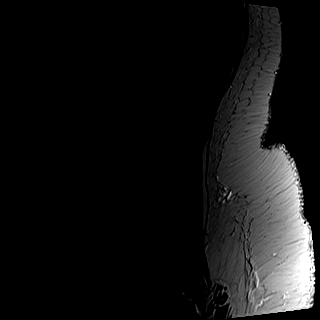
[im 4/17]
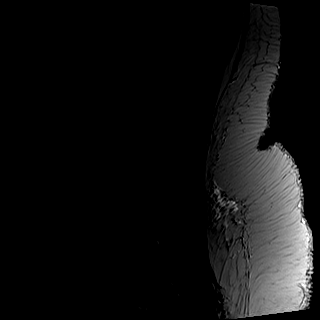
[im 7/17]
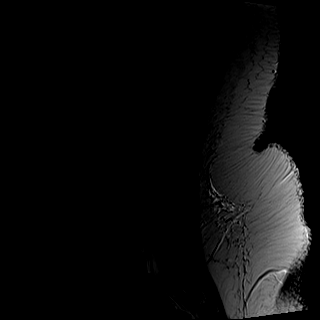
[im 10/17]
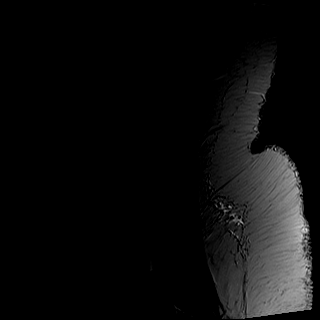
[im 13/17]
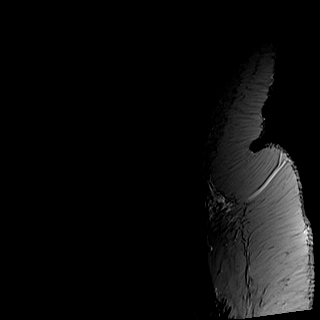
[im 17/17]
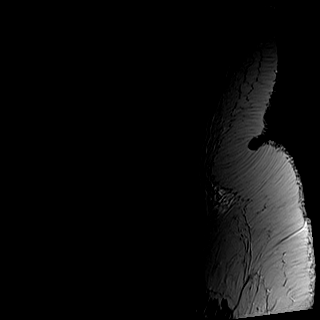

[Series 7: T1 · sagittal · 4.0mm · 0.41mm/px · 7 of 17 slices shown (1 of 2)]
[im 1/17]
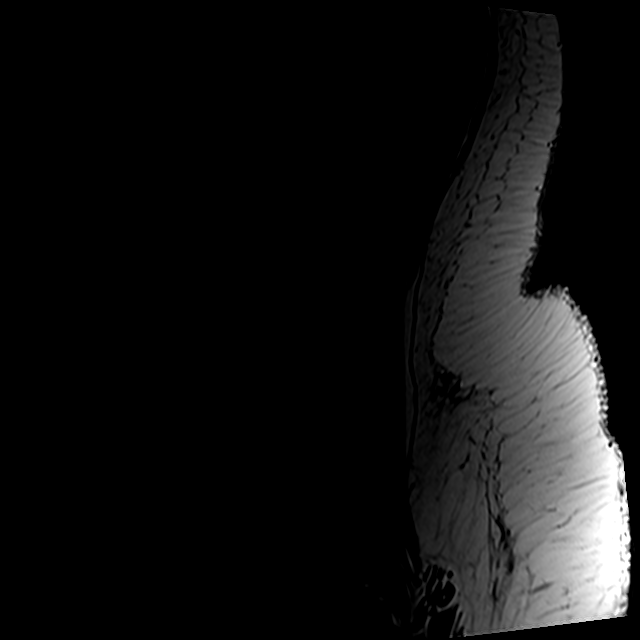
[im 3/17]
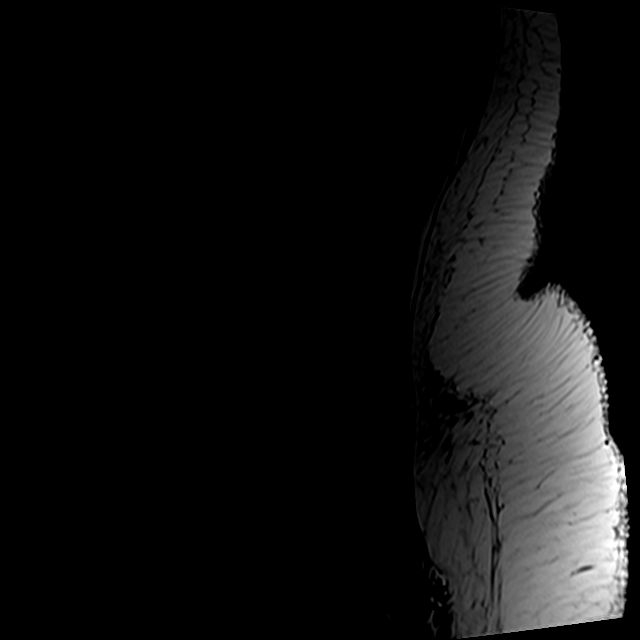
[im 6/17]
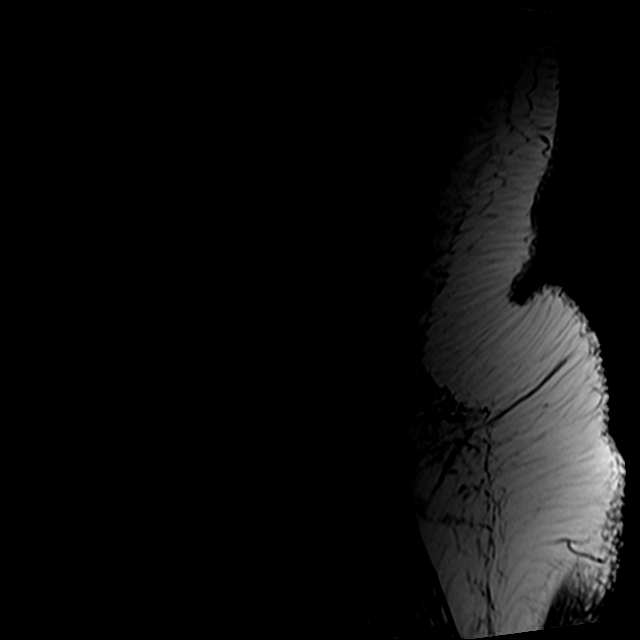
[im 9/17]
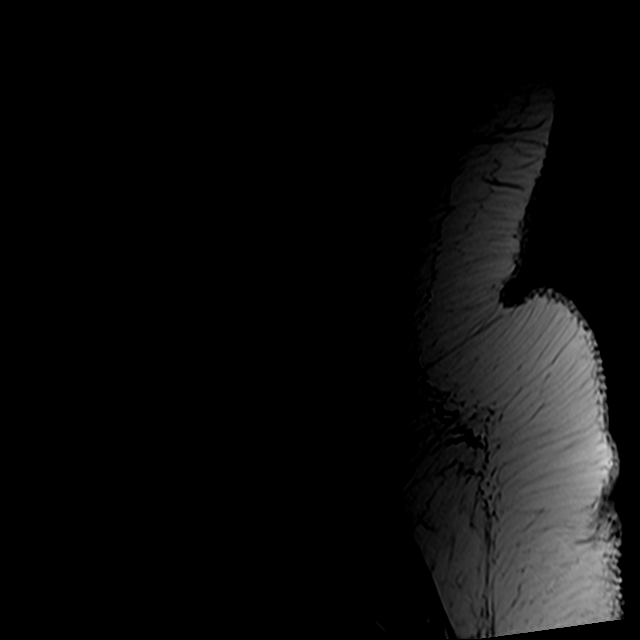
[im 11/17]
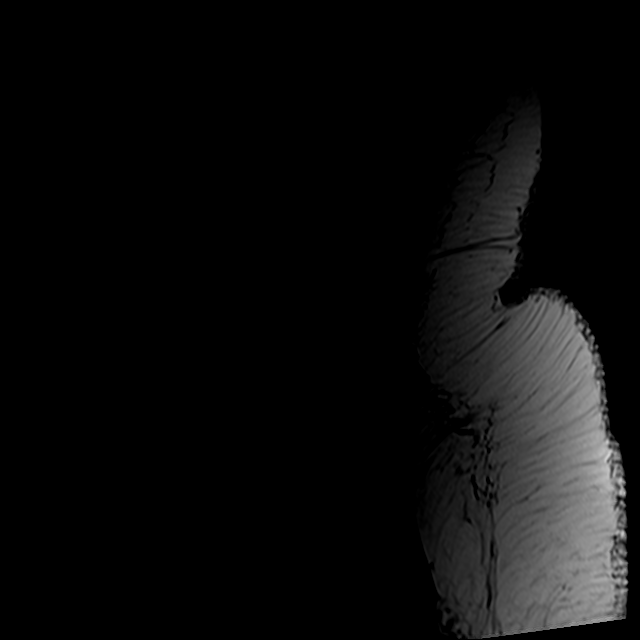
[im 14/17]
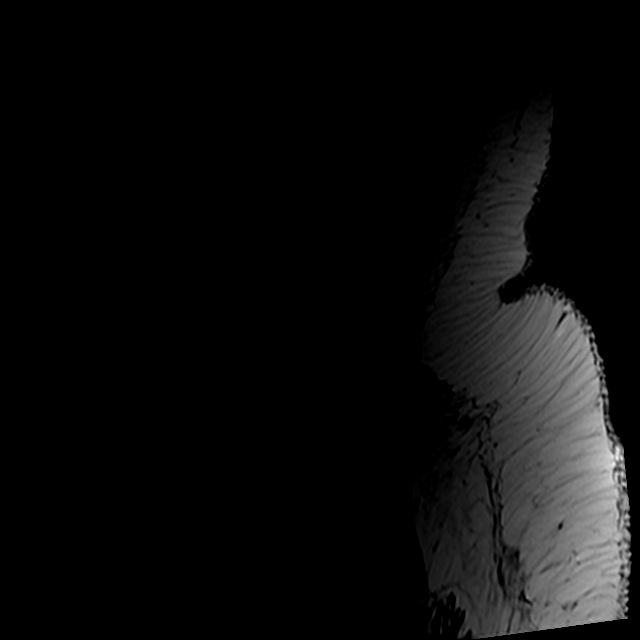
[im 17/17]
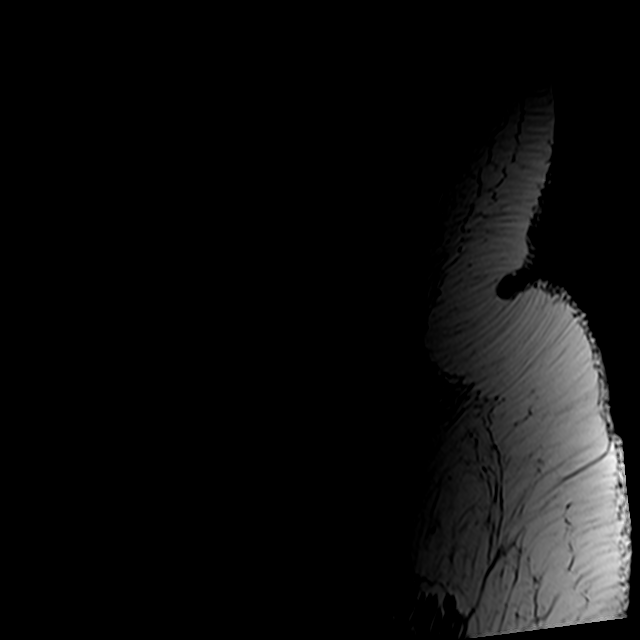

[Series 11: T2 · axial · 4.0mm · 0.78mm/px · z∈[-76,+142]mm · 8 of 37 slices shown (2 of 2)]
[im 1/37]
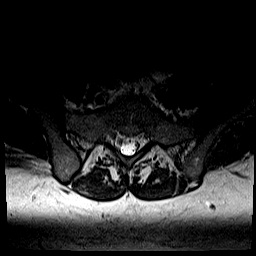
[im 6/37]
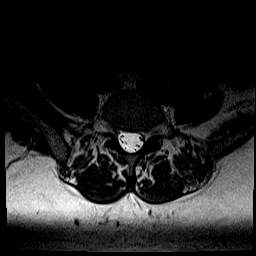
[im 12/37]
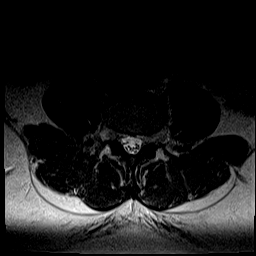
[im 17/37]
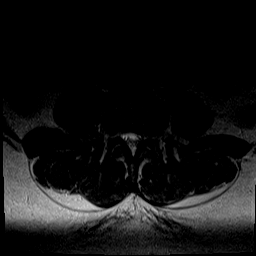
[im 20/37]
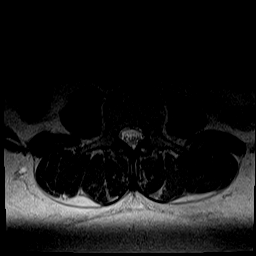
[im 25/37]
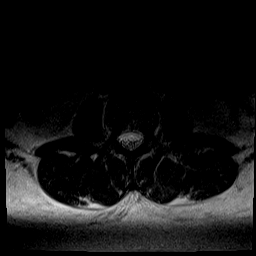
[im 31/37]
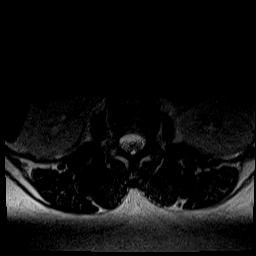
[im 37/37]
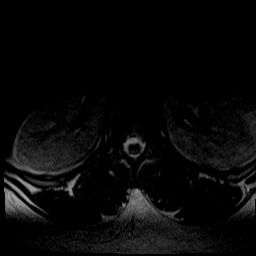

[Series 14: T1 · axial · 4.0mm · 0.39mm/px · z∈[-76,+112]mm · 4 of 37 slices shown (2 of 2)]
[im 1/37]
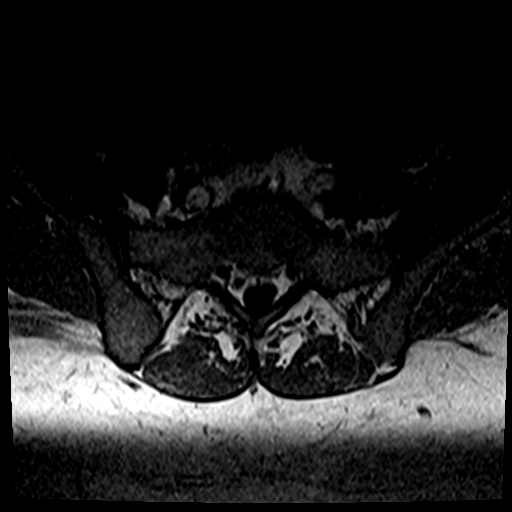
[im 6/37]
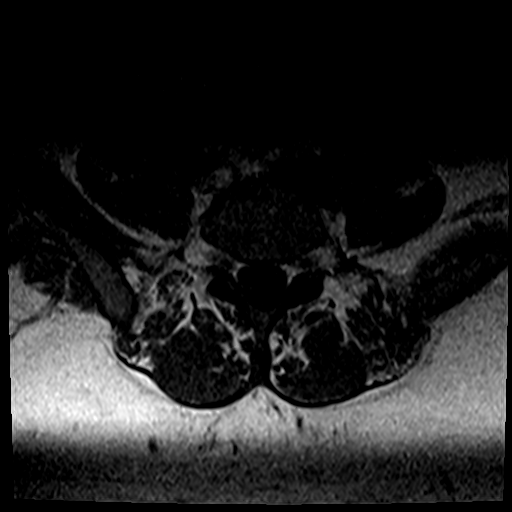
[im 20/37]
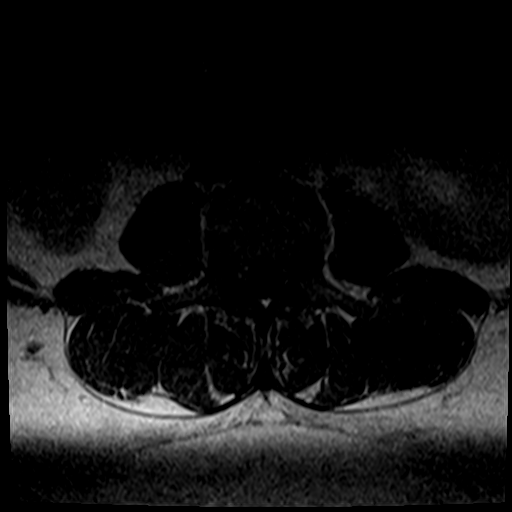
[im 31/37]
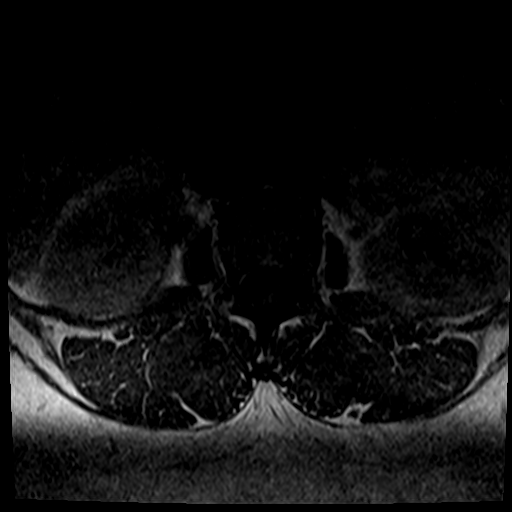

[25 of 48 positions shown; findings below may reference images not displayed]

FINDINGS: Segmentation:  Normal

Alignment:  Normal

Vertebrae:  Normal bone marrow.  Negative for fracture or mass.

Conus medullaris and cauda equina: Conus extends to the L1-2 level.
Conus and cauda equina appear normal.

Paraspinal and other soft tissues: Negative for paraspinous mass or
adenopathy. No paraspinous soft tissue edema or fluid collection.

Disc levels:

L1-2: Negative

L2-3: Negative

L3-4: Diffuse bulging of the disc. Small extraforaminal disc
protrusion on the left with displacement of the left L3 nerve root.
Mild facet degeneration and mild spinal stenosis. Mild subarticular
stenosis on the left.

L4-5: Mild disc bulging.  Negative for stenosis.

L5-S1: Negative
IMPRESSION: Disc bulging L3-4 with small extraforaminal disc protrusion on the
left. Mild spinal stenosis and mild subarticular stenosis on the
left.

## 2021-12-13 ENCOUNTER — Ambulatory Visit: Payer: BLUE CROSS/BLUE SHIELD | Admitting: Medical-Surgical

## 2022-02-09 ENCOUNTER — Encounter: Payer: Self-pay | Admitting: Medical-Surgical

## 2022-02-09 ENCOUNTER — Ambulatory Visit: Payer: BC Managed Care – PPO | Admitting: Medical-Surgical

## 2022-02-09 VITALS — BP 118/82 | HR 77 | Resp 20 | Ht 65.5 in | Wt 314.0 lb

## 2022-02-09 DIAGNOSIS — E559 Vitamin D deficiency, unspecified: Secondary | ICD-10-CM | POA: Diagnosis not present

## 2022-02-09 DIAGNOSIS — E611 Iron deficiency: Secondary | ICD-10-CM

## 2022-02-09 DIAGNOSIS — Z1211 Encounter for screening for malignant neoplasm of colon: Secondary | ICD-10-CM

## 2022-02-09 DIAGNOSIS — Z7689 Persons encountering health services in other specified circumstances: Secondary | ICD-10-CM

## 2022-02-09 DIAGNOSIS — R635 Abnormal weight gain: Secondary | ICD-10-CM

## 2022-02-09 DIAGNOSIS — Z0000 Encounter for general adult medical examination without abnormal findings: Secondary | ICD-10-CM | POA: Diagnosis not present

## 2022-02-09 MED ORDER — ALBUTEROL SULFATE HFA 108 (90 BASE) MCG/ACT IN AERS: 2.0000 | INHALATION_SPRAY | Freq: Four times a day (QID) | RESPIRATORY_TRACT | 3 refills | Status: DC | PRN

## 2022-02-09 NOTE — Patient Instructions (Signed)
Macro diet ?Reverse diet ?

## 2022-02-09 NOTE — Progress Notes (Signed)
?  HPI with pertinent ROS:  ? ?CC: Transfer of care ? ?HPI: ?Pleasant 46 year old female presenting today to transfer care to a new PCP. ? ?Vasomotor symptoms: has started having night sweats, brain fog, and vaginal dryness. Having regular, heavy menses with Mirena IUD in place. Interested in having labs checked for menopause/perimenopause.  ? ?History of iron deficiency with menorrhagia. Would like iron levels checked.  ? ?History of vitamin D deficiency and would like this checked as well.  ? ?Weight concerns: Notes that she has always been a bigger woman but is wanting to have a team assembled to help her with her weight loss goals. Exercising regularly with cardio and strength training. Not currently weighing foods or logging her intake. Verbalizes that her diet could use some improvement and she is aware that eating changes will be the key to her weight loss journey. Would like to have general labs checked to make sure there are no other contributing factors that may hinder weight loss.  ? ?I reviewed the past medical history, family history, social history, surgical history, and allergies today and no changes were needed.  Please see the problem list section below in epic for further details. ? ?Physical exam:  ? ?General: Well Developed, well nourished, and in no acute distress.  ?Neuro: Alert and oriented x3.  ?HEENT: Normocephalic, atraumatic.  ?Skin: Warm and dry. ?Cardiac: Regular rate and rhythm.  ?Respiratory:  Not using accessory muscles, speaking in full sentences. ? ?Impression and Recommendations:   ? ?1. Encounter to establish care ?Reviewed available information and discussed care concerns with patient.  ? ?2. Preventative health care ?Checking labs as below. ?- CBC with Differential/Platelet ?- COMPLETE METABOLIC PANEL WITH GFR ?- Lipid panel ? ?3. Vitamin D deficiency ?Checking vitamin D. ?- VITAMIN D 25 Hydroxy (Vit-D Deficiency, Fractures) ? ?4. Iron deficiency ?Checking iron panel. ?-  Fe+TIBC+Fer ? ?5. Weight gain ?Checking TSH and hemoglobin A1c.  Referring to medical weight management.  Discussed recommendations for modification of her diet.  Would like for her to get a food scale and start weighing her portions as well as logging her foods to accurately track what she is eating.  Recommend continuing regular intentional exercise with a focus on strength training and muscle building as this will help change her body composition.  Information provided regarding macro diet and macro counting.  Also discussed reverse dieting if she is eating a restricted calorie diet and does not have room for cutting back.  We will hold off on medications for now and follow-up in about 6 weeks to discuss what is working well for her and what does not do with these measures. ?- TSH ?- Hemoglobin A1c ?- Amb Ref to Medical Weight Management ? ?6. Colon cancer screening ?Referring to GI for colonoscopy. ?- Ambulatory referral to Gastroenterology ? ?Return in about 6 weeks (around 03/23/2022) for weight loss follow up. ?___________________________________________ ?Clearnce Sorrel, DNP, APRN, FNP-BC ?Primary Care and Sports Medicine ?[place] ?

## 2022-02-10 LAB — COMPLETE METABOLIC PANEL WITH GFR
AG Ratio: 1.5 (calc) (ref 1.0–2.5)
ALT: 27 U/L (ref 6–29)
AST: 32 U/L (ref 10–35)
Albumin: 4.6 g/dL (ref 3.6–5.1)
Alkaline phosphatase (APISO): 57 U/L (ref 31–125)
BUN: 15 mg/dL (ref 7–25)
CO2: 18 mmol/L — ABNORMAL LOW (ref 20–32)
Calcium: 10.1 mg/dL (ref 8.6–10.2)
Chloride: 103 mmol/L (ref 98–110)
Creat: 0.75 mg/dL (ref 0.50–0.99)
Globulin: 3.1 g/dL (calc) (ref 1.9–3.7)
Glucose, Bld: 82 mg/dL (ref 65–99)
Potassium: 4.3 mmol/L (ref 3.5–5.3)
Sodium: 141 mmol/L (ref 135–146)
Total Bilirubin: 0.6 mg/dL (ref 0.2–1.2)
Total Protein: 7.7 g/dL (ref 6.1–8.1)
eGFR: 98 mL/min/{1.73_m2} (ref >=60)

## 2022-02-10 LAB — CBC WITH DIFFERENTIAL/PLATELET
Absolute Monocytes: 587 cells/uL (ref 200–950)
Basophils Absolute: 41 cells/uL (ref 0–200)
Basophils Relative: 0.8 %
Eosinophils Absolute: 82 cells/uL (ref 15–500)
Eosinophils Relative: 1.6 %
HCT: 41.1 % (ref 35.0–45.0)
Hemoglobin: 13.9 g/dL (ref 11.7–15.5)
Lymphs Abs: 1826 cells/uL (ref 850–3900)
MCH: 30.4 pg (ref 27.0–33.0)
MCHC: 33.8 g/dL (ref 32.0–36.0)
MCV: 89.9 fL (ref 80.0–100.0)
MPV: 9.8 fL (ref 7.5–12.5)
Monocytes Relative: 11.5 %
Neutro Abs: 2565 cells/uL (ref 1500–7800)
Neutrophils Relative %: 50.3 %
Platelets: 352 10*3/uL (ref 140–400)
RBC: 4.57 10*6/uL (ref 3.80–5.10)
RDW: 13.1 % (ref 11.0–15.0)
Total Lymphocyte: 35.8 %
WBC: 5.1 10*3/uL (ref 3.8–10.8)

## 2022-02-10 LAB — HEMOGLOBIN A1C
Hgb A1c MFr Bld: 5.4 % of total Hgb (ref <5.7)
Mean Plasma Glucose: 108 mg/dL
eAG (mmol/L): 6 mmol/L

## 2022-02-10 LAB — LIPID PANEL
Cholesterol: 196 mg/dL (ref <200)
HDL: 100 mg/dL (ref >=50)
LDL Cholesterol (Calc): 81 mg/dL (calc)
Non-HDL Cholesterol (Calc): 96 mg/dL (calc) (ref <130)
Total CHOL/HDL Ratio: 2 (calc) (ref <5.0)
Triglycerides: 66 mg/dL (ref <150)

## 2022-02-10 LAB — IRON,TIBC AND FERRITIN PANEL
%SAT: 34 % (calc) (ref 16–45)
Ferritin: 160 ng/mL (ref 16–232)
Iron: 115 ug/dL (ref 40–190)
TIBC: 334 mcg/dL (calc) (ref 250–450)

## 2022-02-10 LAB — TSH: TSH: 1.64 mIU/L

## 2022-02-10 LAB — VITAMIN D 25 HYDROXY (VIT D DEFICIENCY, FRACTURES): Vit D, 25-Hydroxy: 72 ng/mL (ref 30–100)

## 2022-03-20 ENCOUNTER — Other Ambulatory Visit (HOSPITAL_BASED_OUTPATIENT_CLINIC_OR_DEPARTMENT_OTHER): Payer: Self-pay | Admitting: Medical-Surgical

## 2022-03-20 DIAGNOSIS — Z1231 Encounter for screening mammogram for malignant neoplasm of breast: Secondary | ICD-10-CM

## 2022-03-22 ENCOUNTER — Ambulatory Visit (INDEPENDENT_AMBULATORY_CARE_PROVIDER_SITE_OTHER): Payer: BC Managed Care – PPO

## 2022-03-22 DIAGNOSIS — Z1231 Encounter for screening mammogram for malignant neoplasm of breast: Secondary | ICD-10-CM

## 2022-03-23 ENCOUNTER — Ambulatory Visit: Payer: BC Managed Care – PPO | Admitting: Medical-Surgical

## 2022-04-10 ENCOUNTER — Encounter: Payer: Self-pay | Admitting: Obstetrics & Gynecology

## 2022-04-10 ENCOUNTER — Ambulatory Visit: Payer: BC Managed Care – PPO | Admitting: Obstetrics & Gynecology

## 2022-04-10 ENCOUNTER — Other Ambulatory Visit (HOSPITAL_COMMUNITY)
Admission: RE | Admit: 2022-04-10 | Discharge: 2022-04-10 | Disposition: A | Discharge status: Home / Self Care | Payer: BC Managed Care – PPO | Source: Ambulatory Visit | Attending: Obstetrics & Gynecology | Admitting: Obstetrics & Gynecology

## 2022-04-10 VITALS — BP 131/75 | HR 92 | Ht 66.0 in | Wt 315.0 lb

## 2022-04-10 DIAGNOSIS — D259 Leiomyoma of uterus, unspecified: Secondary | ICD-10-CM

## 2022-04-10 DIAGNOSIS — Z975 Presence of (intrauterine) contraceptive device: Secondary | ICD-10-CM | POA: Diagnosis not present

## 2022-04-10 DIAGNOSIS — Z01419 Encounter for gynecological examination (general) (routine) without abnormal findings: Secondary | ICD-10-CM | POA: Insufficient documentation

## 2022-04-10 DIAGNOSIS — N921 Excessive and frequent menstruation with irregular cycle: Secondary | ICD-10-CM | POA: Diagnosis not present

## 2022-04-10 NOTE — Progress Notes (Signed)
Subjective:     Lisa Tran is a 46 y.o. female here for a routine exam.  Current complaints: had 2 weeks of spoting after her May menses; + hot flashes and night sweats.   Gynecologic History No LMP recorded. (Menstrual status: IUD). Contraception: IUD Last Pap: 2019. Results were: normal Last mammogram: 2023. Results were: normal  Obstetric History OB History  Gravida Para Term Preterm AB Living  1       1    SAB IAB Ectopic Multiple Live Births               # Outcome Date GA Lbr Len/2nd Weight Sex Delivery Anes PTL Lv  1 AB              The following portions of the patient's history were reviewed and updated as appropriate: allergies, current medications, past family history, past medical history, past social history, past surgical history, and problem list.  Review of Systems Pertinent items noted in HPI and remainder of comprehensive ROS otherwise negative.    Objective:     Vitals:   04/10/22 1057  BP: 131/75  Pulse: 92  Weight: (!) 315 lb (142.9 kg)  Height: '5\' 6"'$  (1.676 m)   Vitals:  WNL General appearance: alert, cooperative and no distress  HEENT: Normocephalic, without obvious abnormality, atraumatic Eyes: negative Throat: lips, mucosa, and tongue normal; teeth and gums normal  Respiratory: Clear to auscultation bilaterally  CV: Regular rate and rhythm  Breasts:  Normal appearance, no masses or tenderness, no nipple retraction or dimpling  GI: Soft, non-tender; bowel sounds normal; no masses,  no organomegaly  GU: External Genitalia:  Tanner V, no lesion Urethra:  No prolapse   Vagina: Pink, normal rugae, no blood or discharge  Cervix: No CMT, no lesion  Uterus:  Normal size and contour, non tender  Adnexa: Normal, no masses, non tender  Musculoskeletal: No edema, redness or tenderness in the calves or thighs  Skin: No lesions or rash  Lymphatic: Axillary adenopathy: none     Psychiatric: Normal mood and behavior        Assessment:     Healthy female exam.    Plan:   Pap with cotesting Yearly mammograms Colonoscopy ordered by PCP [place] to eval for bleeding on Mirena

## 2022-04-12 LAB — CYTOLOGY - PAP
Comment: NEGATIVE
Diagnosis: NEGATIVE
High risk HPV: NEGATIVE

## 2022-04-24 ENCOUNTER — Ambulatory Visit (INDEPENDENT_AMBULATORY_CARE_PROVIDER_SITE_OTHER): Payer: BC Managed Care – PPO

## 2022-04-24 DIAGNOSIS — N939 Abnormal uterine and vaginal bleeding, unspecified: Secondary | ICD-10-CM

## 2022-04-24 DIAGNOSIS — D259 Leiomyoma of uterus, unspecified: Secondary | ICD-10-CM | POA: Diagnosis not present

## 2022-05-05 ENCOUNTER — Encounter: Payer: Self-pay | Admitting: Obstetrics & Gynecology

## 2022-05-25 ENCOUNTER — Encounter: Payer: BC Managed Care – PPO | Admitting: Obstetrics and Gynecology

## 2022-06-13 ENCOUNTER — Encounter: Payer: Self-pay | Admitting: Medical-Surgical

## 2022-06-13 ENCOUNTER — Ambulatory Visit (INDEPENDENT_AMBULATORY_CARE_PROVIDER_SITE_OTHER): Payer: BC Managed Care – PPO

## 2022-06-13 ENCOUNTER — Ambulatory Visit
Admission: EM | Admit: 2022-06-13 | Discharge: 2022-06-13 | Disposition: A | Discharge status: Home / Self Care | Payer: BC Managed Care – PPO | Attending: Family Medicine | Admitting: Family Medicine

## 2022-06-13 DIAGNOSIS — M25562 Pain in left knee: Secondary | ICD-10-CM

## 2022-06-13 MED ORDER — CELECOXIB 100 MG PO CAPS: 100.0000 mg | ORAL_CAPSULE | Freq: Two times a day (BID) | ORAL | 0 refills | Status: DC

## 2022-06-13 NOTE — Discharge Instructions (Addendum)
Advised/informed patient of left knee x-ray results with hard copy provided to patient.  Advised patient to take medication as directed with food to completion.  Encourage patient increase daily water intake while taking this medication.  Advised patient if symptoms worsen and/or unresolved please follow-up with [facility] health orthopedic provider for further evaluation.  Contact information for this provider is below.

## 2022-06-13 NOTE — ED Provider Notes (Signed)
L[facility] Tran [facility]    CSN: [id number] Arrival date & time: 06/13/22  1133      History   Chief Complaint Chief Complaint  Patient presents with   Knee Pain    left    HPI Lisa Tran is a 46 y.o. female.   HPI Pleasant 46 year old female presents with left knee pain and when walking that began 3 days ago.  PMH significant for morbid obesity, asthma, and iron deficiency.  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 Authorizing Provider  celecoxib (CELEBREX) 100 MG capsule Take 1 capsule (100 mg total) by mouth 2 (two) times daily for 15 days. 06/13/22 06/28/22 Yes Eliezer Lofts, FNP  albuterol (VENTOLIN HFA) 108 (90 Base) MCG/ACT inhaler Inhale 2 puffs into the lungs every 6 (six) hours as needed. For shortness of breath or wheezing 02/09/22   Samuel Bouche, NP  cetirizine (ZYRTEC) 10 MG tablet Take 10 mg by mouth daily.    [provider]  Cholecalciferol (VITAMIN D3) 1000 units CAPS Take by mouth.    [provider]  ferrous fumarate-iron polysaccharide complex (TANDEM) 162-115.2 MG CAPS capsule Take by mouth. 03/16/15   [provider]   Multiple Vitamin (MULTIVITAMIN WITH MINERALS) TABS Take 1 tablet by mouth daily.    [provider]    Family History Family History  Problem Relation Age of Onset   Asthma Mother    Diabetes Maternal Grandfather    Prostate cancer Maternal Grandfather        prostate   Diabetes Maternal Aunt    Hypertension Maternal Aunt     Social History Social History   Tobacco Use   Smoking status: Never   Smokeless tobacco: Never  Vaping Use   Vaping Use: Never used  Substance Use Topics   Alcohol use: Yes    Comment: occ   Drug use: No     Allergies   Shellfish allergy   Review of Systems Review of Systems  Musculoskeletal:        Left knee pain x3 days when walking  All other systems reviewed and are negative.    Physical Exam Triage Vital Signs ED Triage Vitals  Enc Vitals Group     BP 06/13/22 1140 119/78     Pulse Rate 06/13/22 1140 88  Resp 06/13/22 1140 14     Temp 06/13/22 1140 98.4 F (36.9 C)     Temp Source 06/13/22 1140 Oral     SpO2 06/13/22 1140 100 %     Weight --      Height --      Head Circumference --      Peak Flow --      Pain Score 06/13/22 1139 3     Pain Loc --      Pain Edu? --      Excl. in [place]? --    No data found.  Updated Vital Signs BP 119/78 (BP Location: Right Arm)   Pulse 88   Temp 98.4 F (36.9 C) (Oral)   Resp 14   SpO2 100%    Physical Exam Vitals and nursing note reviewed.  Constitutional:      General: She is not in acute distress.    Appearance: She is obese. She is not ill-appearing.  HENT:     Head: Normocephalic and atraumatic.     Mouth/Throat:     Mouth: Mucous membranes are moist.     Pharynx: Oropharynx is clear.  Eyes:     Extraocular Movements: Extraocular movements intact.     Conjunctiva/sclera: Conjunctivae normal.     Pupils: Pupils are equal, round, and reactive to light.  Cardiovascular:     Rate and Rhythm: Normal rate and regular rhythm.     Pulses: Normal pulses.      Heart sounds: Normal heart sounds.  Pulmonary:     Effort: Pulmonary effort is normal.     Breath sounds: Normal breath sounds. No wheezing, rhonchi or rales.  Musculoskeletal:        General: Normal range of motion.     Cervical back: Normal range of motion and neck supple.     Comments: Left knee: TTP over medial inferior aspect of patella with moderate soft tissue swelling noted  Skin:    General: Skin is warm and dry.  Neurological:     General: No focal deficit present.     Mental Status: She is alert and oriented to person, place, and time.      UC Treatments / Results  Labs (all labs ordered are listed, but only abnormal results are displayed) Labs Reviewed - No data to display  EKG   Radiology DG Knee Complete 4 Views Left  Result Date: 06/13/2022 CLINICAL DATA:  Medial left knee pain after walking 4 days ago and feeling a pop. EXAM: LEFT KNEE - COMPLETE 4+ VIEW COMPARISON:  None Available. FINDINGS: Mild medial compartment joint space narrowing and peripheral osteophytosis. Minimal peripheral lateral compartment degenerative osteophytosis. No joint effusion. No acute fracture or dislocation. IMPRESSION: Anatomy or chronic osteoarthritis. Electronically Signed   By: Yvonne Kendall M.D.   On: 06/13/2022 12:08    Procedures Procedures (including critical care time)  Medications Ordered in UC Medications - No data to display  Initial Impression / Assessment and Plan / UC Course  I have reviewed the triage vital signs and the nursing notes.  Pertinent labs & imaging results that were available during my care of the patient were reviewed by me and considered in my medical decision making (see chart for details).     MDM: 1.  Acute pain of left knee-left knee x-ray results revealed above, Rx'd Celebrex. Advised/informed patient of left knee x-ray results with hard copy provided to patient.  Advised patient to take medication as directed with food to  completion.  Encourage  patient increase daily water intake while taking this medication.  Advised patient if symptoms worsen and/or unresolved please follow-up with [facility] health orthopedic provider for further evaluation.  Contact information for this provider is below.  Patient discharged home, hemodynamically stable. Final Clinical Impressions(s) / UC Diagnoses   Final diagnoses:  Acute pain of left knee     Discharge Instructions      Advised/informed patient of left knee x-ray results with hard copy provided to patient.  Advised patient to take medication as directed with food to completion.  Encourage patient increase daily water intake while taking this medication.  Advised patient if symptoms worsen and/or unresolved please follow-up with [facility] health orthopedic provider for further evaluation.  Contact information for this provider is below.     ED Prescriptions     Medication Sig Dispense Auth. Provider   celecoxib (CELEBREX) 100 MG capsule Take 1 capsule (100 mg total) by mouth 2 (two) times daily for 15 days. 30 capsule Eliezer Lofts, FNP      PDMP not reviewed this encounter.   Eliezer Lofts, [place] 06/13/22 1239

## 2022-06-13 NOTE — ED Triage Notes (Signed)
Pt presents with c/o left knee pain when walking that began Saturday. NKI.

## 2022-06-14 ENCOUNTER — Encounter: Payer: Self-pay | Admitting: Medical-Surgical

## 2022-06-14 MED ORDER — CELECOXIB 100 MG PO CAPS: 100.0000 mg | ORAL_CAPSULE | Freq: Two times a day (BID) | ORAL | 0 refills | Status: AC

## 2022-06-14 NOTE — Telephone Encounter (Signed)
Prescription resent in my name. Ok to do [place].

## 2022-06-21 ENCOUNTER — Ambulatory Visit: Payer: BC Managed Care – PPO | Admitting: Family Medicine

## 2022-06-21 ENCOUNTER — Encounter: Payer: Self-pay | Admitting: Family Medicine

## 2022-06-21 VITALS — BP 117/65 | Ht 66.0 in | Wt 315.0 lb

## 2022-06-21 DIAGNOSIS — M170 Bilateral primary osteoarthritis of knee: Secondary | ICD-10-CM | POA: Diagnosis not present

## 2022-06-21 MED ORDER — DICLOFENAC SODIUM 2 % EX SOLN: 2.0000 | Freq: Two times a day (BID) | CUTANEOUS | 2 refills | Status: DC

## 2022-06-21 NOTE — Progress Notes (Signed)
  Lisa Tran - 46 y.o. female MRN [id number]  Date of birth: September 20, 1976  SUBJECTIVE:  Including CC & ROS.  No chief complaint on file.   Lisa Tran is a 46 y.o. female that is presenting with acute right and left knee pain.  The pain is occurring over the medial compartment.  She denies any acute injury.  She walks about 3 days a week.  Has a history of knee surgery on the right side.  Review of the urgent care note from 9/5 shows she was provided Celebrex. Independent review of the left knee x-ray from 9/5 shows mild medial joint space narrowing.  Review of Systems See HPI   HISTORY: Past Medical, Surgical, Social, and Family History Reviewed & Updated per EMR.   Pertinent Historical Findings include:  Past Medical History:  Diagnosis Date   Allergy    Asthma     Past Surgical History:  Procedure Laterality Date   COSMETIC SURGERY     GASTRIC BYPASS  2003   KNEE SURGERY  2014     PHYSICAL EXAM:  VS: BP 117/65 (BP Location: Left Arm, Patient Position: Sitting)   Ht '5\' 6"'$  (1.676 m)   Wt (!) 315 lb (142.9 kg)   BMI 50.84 kg/m  Physical Exam Gen: NAD, alert, cooperative with exam, well-appearing MSK:  Neurovascularly intact       ASSESSMENT & PLAN:   Primary osteoarthritis of both knees Acutely occurring.  Symptoms most consistent with degenerative changes. -Counseled on home exercise therapy and supportive care. - pennsaid  -Referral to physical therapy. -Green sport insoles. -Could consider further imaging or injection.

## 2022-06-21 NOTE — Assessment & Plan Note (Signed)
Acutely occurring.  Symptoms most consistent with degenerative changes. -Counseled on home exercise therapy and supportive care. - pennsaid  -Referral to physical therapy. -Green sport insoles. -Could consider further imaging or injection.

## 2022-06-21 NOTE — Patient Instructions (Signed)
Nice to meet you Please use ice as needed  Please try the exercises  I have made a referral to physical therapy  I have sent an order for the rub on medicine  Please send me a message in MyChart with any questions or updates.  Please see me back in 4-6 weeks.   --Dr. Raeford Razor

## 2022-06-26 ENCOUNTER — Encounter: Payer: Self-pay | Admitting: Physical Therapy

## 2022-06-26 ENCOUNTER — Ambulatory Visit: Payer: BC Managed Care – PPO | Attending: Family Medicine | Admitting: Physical Therapy

## 2022-06-26 DIAGNOSIS — R2689 Other abnormalities of gait and mobility: Secondary | ICD-10-CM

## 2022-06-26 DIAGNOSIS — M170 Bilateral primary osteoarthritis of knee: Secondary | ICD-10-CM | POA: Diagnosis not present

## 2022-06-26 DIAGNOSIS — M6281 Muscle weakness (generalized): Secondary | ICD-10-CM

## 2022-06-26 DIAGNOSIS — R262 Difficulty in walking, not elsewhere classified: Secondary | ICD-10-CM

## 2022-06-26 NOTE — Therapy (Signed)
OUTPATIENT PHYSICAL THERAPY LOWER EXTREMITY EVALUATION   Patient Name: Lisa Tran MRN: [id number] DOB:02-27-1976, 46 y.o., female Today's Date: 06/26/2022    Past Medical History:  Diagnosis Date   Allergy    Asthma    Past Surgical History:  Procedure Laterality Date   COSMETIC SURGERY     GASTRIC BYPASS  2003   KNEE SURGERY  2014   Patient Active Problem List   Diagnosis Date Noted   Primary osteoarthritis of both knees 06/21/2022   Family history of colon cancer 09/23/2020   Fibroids 10/15/2017   Thrombophlebitis of superficial veins of left lower extremity 07/21/2017   Cervical paraspinal muscle spasm 10/05/2015   Iron deficiency 03/16/2015   Other specified postprocedural states 02/25/2015   Morbid obesity ([place]) 02/25/2015   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  Rationale for Evaluation and Treatment Rehabilitation  ONSET DATE: Labor Day weekend  SUBJECTIVE:   SUBJECTIVE STATEMENT: Pt notes that she was in the park on Labor Day weekend and her left knee flared up. It got worse. No method of injury. X-ray performed and imaging demonstrated arthritis. Pt is now starting to feel it in her right knee too. Started celebrex and went to Sports Medicine who recommended PT. Pt does state her pain has improved with celebrex. Pt is a [employer] of [employer]  PERTINENT HISTORY: Has a history of knee surgery on the right side.   PAIN:  Are you having pain? Yes: NPRS scale: At worse 4 or 5, currently 3/10 Pain location: bilat knees Pain description: Throbbing at night Aggravating factors: Increased weight bearing; after sitting too long and first standing up Relieving factors: No weight  PRECAUTIONS: None  WEIGHT BEARING RESTRICTIONS No  FALLS:  Has patient fallen in last 6 months?  No  LIVING ENVIRONMENT: Lives with: lives with their family and lives with their spouse Lives in: House/apartment Stairs: Yes: Internal: 15 steps; on right going up Has following equipment at home: None  OCCUPATION: Desk work  PLOF: Independent  PATIENT GOALS Return back to walking   OBJECTIVE:   DIAGNOSTIC FINDINGS: X-rays indicate knee OA  PATIENT SURVEYS:  FOTO 52; predicted 7  COGNITION:  Overall cognitive status: Within functional limits for tasks assessed     SENSATION: WFL  EDEMA:  None  MUSCLE LENGTH: Hamstrings: Right 80 deg; Left 45 deg  POSTURE: No Significant postural limitations  PALPATION: TTP medial L knee > R knee  LOWER EXTREMITY ROM:  Active ROM Right eval Left eval  Hip flexion    Hip extension    Hip abduction    Hip adduction    Hip internal rotation    Hip external rotation    Knee flexion 105 105  Knee extension 0 0  Ankle dorsiflexion    Ankle plantarflexion    Ankle inversion    Ankle eversion     (Blank rows = not tested)  LOWER EXTREMITY MMT:  MMT Right eval Left eval  Hip flexion 4+ 4+  Hip extension 4- 4-  Hip abduction 3+ 3  Hip adduction 3 3  Hip internal rotation    Hip external rotation    Knee flexion 4+ 4+  Knee extension 5 4+  Ankle dorsiflexion    Ankle plantarflexion    Ankle inversion    Ankle eversion     (  Blank rows = not tested)  LOWER EXTREMITY SPECIAL TESTS:  Hip special tests: Saralyn Pilar (FABER) test: negative, Thomas test: negative, and Craig's test: negative  FUNCTIONAL TESTS:  N/a  GAIT: Distance walked: 150' Assistive device utilized: None Level of assistance: Complete [place] Comments: WFL    TODAY'S TREATMENT: See HEP   PATIENT EDUCATION:  Education details: Exam findings, POC, and initial HEP Person educated: Patient Education method: Explanation, Demonstration, and Handouts Education comprehension: verbalized understanding, returned demonstration, and needs  further education   HOME EXERCISE PROGRAM: Access Code: [email or web address] URL: [email or web address] Date: 06/26/2022 Prepared by: Estill Bamberg April Thurnell Garbe  Exercises - Supine Quadriceps Stretch with Strap on Table  - 1 x daily - 7 x weekly - 2 sets - 30 sec hold - Seated Hamstring Stretch  - 1 x daily - 7 x weekly - 2 sets - 30 sec hold - Hooklying Hamstring Stretch with Strap  - 1 x daily - 7 x weekly - 2 sets - 30 sec hold - Straight Leg Raise with External Rotation  - 1 x daily - 7 x weekly - 2 sets - 10 reps - Clamshell with Resistance  - 1 x daily - 7 x weekly - 2 sets - 10 reps - Supine Bridge  - 1 x daily - 7 x weekly - 2 sets - 10 reps  ASSESSMENT:  CLINICAL IMPRESSION: Patient is a 46 y.o. F who was seen today for physical therapy evaluation and treatment for bilat knee pain. Assessment significant for L>R knee pain with L>R hamstring tightness, and bilat hip weakness affecting amb and transfers. Pt would benefit from PT to address these issues for return to pain free walking and community activities.    OBJECTIVE IMPAIRMENTS decreased activity tolerance, decreased endurance, decreased mobility, difficulty walking, decreased strength, increased fascial restrictions, improper body mechanics, postural dysfunction, obesity, and pain.   ACTIVITY LIMITATIONS standing, transfers, and locomotion level  PARTICIPATION LIMITATIONS: shopping, community activity, and yard work  PERSONAL FACTORS Fitness and Past/current experiences are also affecting patient's functional outcome.   REHAB POTENTIAL: Good  CLINICAL DECISION MAKING: Stable/uncomplicated  EVALUATION COMPLEXITY: Low   GOALS: Goals reviewed with patient? Yes   LONG TERM GOALS: Target date: 08/07/2022   Pt will be ind with continuing and advancing HEP at home and in community Baseline:  Goal status: INITIAL  2.  Pt will report reduction in pain by at least 50% Baseline:  Goal status: INITIAL  3.  Pt  will be able to return to ambulating at least 1 mile for return to normal activities Baseline:  Goal status: INITIAL  4.  Pt will demo at least 4+/5 bilat LE strength Baseline:  Goal status: INITIAL  5.  Pt will have increased FOTO score to at least 71 Baseline: 52 Goal status: INITIAL     PLAN: PT FREQUENCY: 2x/week  PT DURATION: 6 weeks  PLANNED INTERVENTIONS: Therapeutic exercises, Therapeutic activity, Neuromuscular re-education, Balance training, Gait training, Patient/Family education, Self Care, Joint mobilization, Aquatic Therapy, Dry Needling, Electrical stimulation, Cryotherapy, Moist heat, Taping, Ionotophoresis '4mg'$ /ml Dexamethasone, and Manual therapy  PLAN FOR NEXT SESSION: Assess response to HEP. Continue hip/knee strengthening. Aquatics?   Shauntia Levengood April Ma L Prosper Paff, PT, DPT 06/26/2022, 7:53 AM

## 2022-06-30 ENCOUNTER — Encounter: Payer: Self-pay | Admitting: Physical Therapy

## 2022-06-30 ENCOUNTER — Ambulatory Visit: Payer: BC Managed Care – PPO | Admitting: Physical Therapy

## 2022-06-30 ENCOUNTER — Telehealth: Payer: Self-pay

## 2022-06-30 DIAGNOSIS — R2689 Other abnormalities of gait and mobility: Secondary | ICD-10-CM

## 2022-06-30 DIAGNOSIS — R262 Difficulty in walking, not elsewhere classified: Secondary | ICD-10-CM

## 2022-06-30 DIAGNOSIS — M6281 Muscle weakness (generalized): Secondary | ICD-10-CM

## 2022-06-30 DIAGNOSIS — M170 Bilateral primary osteoarthritis of knee: Secondary | ICD-10-CM

## 2022-06-30 NOTE — Therapy (Signed)
OUTPATIENT PHYSICAL THERAPY TREATMENT   Patient Name: Lametria Klunk MRN: [id number] DOB:[birth year], 46 y.o., female Today's Date: 06/30/2022   PT End of Session - 06/30/22 0801     Visit Number 2    Number of Visits 12    Date for PT Re-Evaluation 08/07/22    Authorization Type BCBS    PT Start Time 0801    PT Stop Time 0845    PT Time Calculation (min) 44 min    Activity Tolerance Patient tolerated treatment well    Behavior During Therapy [facility] for tasks assessed/performed             Past Medical History:  Diagnosis Date   Allergy    Asthma    Past Surgical History:  Procedure Laterality Date   COSMETIC SURGERY     GASTRIC BYPASS  2003   KNEE SURGERY  2014   Patient Active Problem List   Diagnosis Date Noted   Primary osteoarthritis of both knees 06/21/2022   Family history of colon cancer 09/23/2020   Fibroids 10/15/2017   Thrombophlebitis of superficial veins of left lower extremity 07/21/2017   Cervical paraspinal muscle spasm 10/05/2015   Iron deficiency 03/16/2015   Other specified postprocedural states 02/25/2015   Morbid obesity ([place]) 02/25/2015   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 Has watched the videos on HEP.   From eval: Pt notes that she was in the park on Labor Day weekend and her left knee flared up. It got worse. No method of injury. X-ray performed and imaging demonstrated arthritis. Pt is now starting to feel it in her right knee too.  Started celebrex and went to Sports Medicine who recommended PT. Pt does state her pain has improved with celebrex. Pt is a [employer]  PERTINENT HISTORY: Has a history of knee surgery on the right side.   PAIN:  Are you having pain? Yes: NPRS scale: At worse 4 or 5, currently 3/10 Pain location: bilat knees Pain description: Throbbing at night Aggravating factors: Increased weight bearing; after sitting too long and first standing up Relieving factors: No weight  PRECAUTIONS: None  WEIGHT BEARING RESTRICTIONS No  FALLS:  Has patient fallen in last 6 months? No  LIVING ENVIRONMENT: Lives with: lives with their family and lives with their spouse Lives in: House/apartment Stairs: Yes: Internal: 15 steps; on right going up Has following equipment at home: None  OCCUPATION: Desk work  PLOF: Independent  PATIENT GOALS Return back to walking   OBJECTIVE:   FROM EVAL  DIAGNOSTIC FINDINGS: X-rays indicate knee OA  PATIENT SURVEYS:  FOTO 52; predicted 16  COGNITION:  Overall cognitive status: Within functional limits for tasks assessed     SENSATION: WFL  EDEMA:  None  MUSCLE LENGTH: Hamstrings: Right 80 deg; Left 45 deg  POSTURE: No Significant postural limitations  PALPATION: TTP medial L knee > R knee  LOWER EXTREMITY ROM:  Active ROM Right eval Left eval  Hip flexion    Hip extension  Hip abduction    Hip adduction    Hip internal rotation    Hip external rotation    Knee flexion 105 105  Knee extension 0 0  Ankle dorsiflexion    Ankle plantarflexion    Ankle inversion    Ankle eversion     (Blank rows = not tested)  LOWER EXTREMITY MMT:  MMT Right eval Left eval  Hip flexion 4+ 4+  Hip extension 4- 4-  Hip abduction 3+ 3  Hip adduction 3 3  Hip internal rotation    Hip external rotation    Knee flexion 4+ 4+  Knee extension 5 4+  Ankle dorsiflexion    Ankle plantarflexion    Ankle inversion    Ankle eversion     (Blank  rows = not tested)  LOWER EXTREMITY SPECIAL TESTS:  Hip special tests: Saralyn Pilar (FABER) test: negative, Thomas test: negative, and Craig's test: negative  FUNCTIONAL TESTS:  N/a  GAIT: Distance walked: 150' Assistive device utilized: None Level of assistance: Complete Independence Comments: WFL    TODAY'S TREATMENT: 06/30/22: THEREX Nustep L6 x 5 min UEs/LEs  Supine Quadriceps stretch with strap 2x30 sec R&L Hamstring stretch with strap x30 sec R&L SLR + hip ER 2x10 SLR 2x10 Bridging 2x10 Ball squeeze 2x10  Sidelying Clamshell red TB 2x10  Sitting Hamstring stretch x30 sec R&L Hip flexor stretch x30 sec R&L Piriformis stretch x30 sec R&L  Standing Heel raise 2x10      PATIENT EDUCATION:  Education details: Exam findings, POC, and initial HEP Person educated: Patient Education method: Explanation, Demonstration, and Handouts Education comprehension: verbalized understanding, returned demonstration, and needs further education   HOME EXERCISE PROGRAM: Access Code: [email or web address] URL: [email or web address] Date: 06/30/2022 Prepared by: Estill Bamberg April Thurnell Garbe  Exercises - Supine Quadriceps Stretch with Strap on Table  - 1 x daily - 7 x weekly - 2 sets - 30 sec hold - Seated Hamstring Stretch  - 1 x daily - 7 x weekly - 2 sets - 30 sec hold - Hooklying Hamstring Stretch with Strap  - 1 x daily - 7 x weekly - 2 sets - 30 sec hold - Straight Leg Raise with External Rotation  - 1 x daily - 7 x weekly - 2 sets - 10 reps - Clamshell with Resistance  - 1 x daily - 7 x weekly - 2 sets - 10 reps - Supine Bridge  - 1 x daily - 7 x weekly - 2 sets - 10 reps - Supine Hip Adduction Isometric with Ball  - 1 x daily - 7 x weekly - 2 sets - 10 reps  ASSESSMENT:  CLINICAL IMPRESSION: 06/30/22: Session focused on reviewing HEP. Continued stretching and strengthening of hips/knees. Pt demos good tolerance.   From eval: Patient is a 46 y.o. F who was seen today  for physical therapy evaluation and treatment for bilat knee pain. Assessment significant for L>R knee pain with L>R hamstring tightness, and bilat hip weakness affecting amb and transfers. Pt would benefit from PT to address these issues for return to pain free walking and community activities.    OBJECTIVE IMPAIRMENTS decreased activity tolerance, decreased endurance, decreased mobility, difficulty walking, decreased strength, increased fascial restrictions, improper body mechanics, postural dysfunction, obesity, and pain.   ACTIVITY LIMITATIONS standing, transfers, and locomotion level  PARTICIPATION LIMITATIONS: shopping, community activity, and yard work  PERSONAL FACTORS Fitness and Past/current experiences are also affecting patient's functional outcome.   REHAB POTENTIAL: Good  CLINICAL DECISION MAKING:  Stable/uncomplicated  EVALUATION COMPLEXITY: Low   GOALS: Goals reviewed with patient? Yes   LONG TERM GOALS: Target date: 08/07/2022   Pt will be ind with continuing and advancing HEP at home and in community Baseline:  Goal status: IN PROGRESS  2.  Pt will report reduction in pain by at least 50% Baseline:  Goal status: IN PROGRESS  3.  Pt will be able to return to ambulating at least 1 mile for return to normal activities Baseline:  Goal status: IN PROGRESS  4.  Pt will demo at least 4+/5 bilat LE strength Baseline:  Goal status: IN PROGRESS  5.  Pt will have increased FOTO score to at least 71 Baseline: 52 Goal status: IN PROGRESS     PLAN: PT FREQUENCY: 2x/week  PT DURATION: 6 weeks  PLANNED INTERVENTIONS: Therapeutic exercises, Therapeutic activity, Neuromuscular re-education, Balance training, Gait training, Patient/Family education, Self Care, Joint mobilization, Aquatic Therapy, Dry Needling, Electrical stimulation, Cryotherapy, Moist heat, Taping, Ionotophoresis '4mg'$ /ml Dexamethasone, and Manual therapy  PLAN FOR NEXT SESSION: Assess response to  HEP. Continue hip/knee strengthening. Aquatics?   Fernanda Twaddell April Ma L Tranice Laduke, PT, DPT 06/30/2022, 8:01 AM

## 2022-06-30 NOTE — Telephone Encounter (Signed)
Initiated Prior authorization [id number] '100MG'$  capsules Via: Covermymeds Case/Key:[id number] Status: approved  as of 06/30/22 Reason: Starts on: 06/15/2022 12:00:00 AM, Coverage Ends on: 06/15/2023  Notified Pt via: Mychart

## 2022-07-10 ENCOUNTER — Ambulatory Visit: Payer: BC Managed Care – PPO | Attending: Family Medicine | Admitting: Physical Therapy

## 2022-07-10 ENCOUNTER — Encounter: Payer: Self-pay | Admitting: Physical Therapy

## 2022-07-10 DIAGNOSIS — M170 Bilateral primary osteoarthritis of knee: Secondary | ICD-10-CM

## 2022-07-10 DIAGNOSIS — M6281 Muscle weakness (generalized): Secondary | ICD-10-CM | POA: Diagnosis not present

## 2022-07-10 DIAGNOSIS — R262 Difficulty in walking, not elsewhere classified: Secondary | ICD-10-CM | POA: Diagnosis not present

## 2022-07-10 DIAGNOSIS — R2689 Other abnormalities of gait and mobility: Secondary | ICD-10-CM

## 2022-07-10 NOTE — Therapy (Signed)
OUTPATIENT PHYSICAL THERAPY TREATMENT   Patient Name: Lisa Tran MRN: [id number] DOB:[birth year], 46 y.o., female Today's Date: 07/10/2022   PT End of Session - 07/10/22 0801     Visit Number 3    Number of Visits 12    Date for PT Re-Evaluation 08/07/22    Authorization Type BCBS    PT Start Time 0801    PT Stop Time 0845    PT Time Calculation (min) 44 min    Activity Tolerance Patient tolerated treatment well    Behavior During Therapy [facility] for tasks assessed/performed             Past Medical History:  Diagnosis Date   Allergy    Asthma    Past Surgical History:  Procedure Laterality Date   COSMETIC SURGERY     GASTRIC BYPASS  2003   KNEE SURGERY  2014   Patient Active Problem List   Diagnosis Date Noted   Primary osteoarthritis of both knees 06/21/2022   Family history of colon cancer 09/23/2020   Fibroids 10/15/2017   Thrombophlebitis of superficial veins of left lower extremity 07/21/2017   Cervical paraspinal muscle spasm 10/05/2015   Iron deficiency 03/16/2015   Other specified postprocedural states 02/25/2015   Morbid obesity ([place]) 02/25/2015   H/O gastric bypass 02/25/2015    PCP: Samuel Bouche, NP  REFERRING PROVIDER: Rosemarie Ax, MD   REFERRING DIAG: M17.0 (ICD-10-CM) - Primary osteoarthritis of both knees   THERAPY DIAG:  Primary osteoarthritis of both knees  Difficulty in walking, not elsewhere classified  Muscle weakness (generalized)  Other abnormalities of gait and mobility  Rationale for Evaluation and Treatment Rehabilitation  ONSET DATE: Labor Day weekend  SUBJECTIVE:   SUBJECTIVE STATEMENT: 07/10/22: Pt states she just got back from vacation from [place]. Pt reports she did a lot of walking and feeling it in her knees.  From eval: Pt notes that she was in the park on Labor Day weekend and her left knee flared up. It got worse. No method of injury. X-ray performed and imaging demonstrated arthritis. Pt is now  starting to feel it in her right knee too. Started celebrex and went to Sports Medicine who recommended PT. Pt does state her pain has improved with celebrex. Pt is a [employer] of [employer]  PERTINENT HISTORY: Has a history of knee surgery on the right side.   PAIN:  Are you having pain? Yes: NPRS scale: At worse 4 or 5, currently 3/10 Pain location: bilat knees Pain description: Throbbing at night Aggravating factors: Increased weight bearing; after sitting too long and first standing up Relieving factors: No weight  PRECAUTIONS: None  WEIGHT BEARING RESTRICTIONS No  FALLS:  Has patient fallen in last 6 months? No  LIVING ENVIRONMENT: Lives with: lives with their family and lives with their spouse Lives in: House/apartment Stairs: Yes: Internal: 15 steps; on right going up Has following equipment at home: None  OCCUPATION: Desk work  PLOF: Independent  PATIENT GOALS Return back to walking   OBJECTIVE:   FROM EVAL  DIAGNOSTIC FINDINGS: X-rays indicate knee OA  PATIENT SURVEYS:  FOTO 52; predicted 42  COGNITION:  Overall cognitive status: Within functional limits for tasks assessed     SENSATION: WFL  EDEMA:  None  MUSCLE LENGTH: Hamstrings: Right 80 deg; Left 45 deg  POSTURE: No Significant postural limitations  PALPATION: TTP medial L knee > R knee  LOWER EXTREMITY ROM:  Active ROM Right eval Left eval  Hip flexion  Hip extension    Hip abduction    Hip adduction    Hip internal rotation    Hip external rotation    Knee flexion 105 105  Knee extension 0 0  Ankle dorsiflexion    Ankle plantarflexion    Ankle inversion    Ankle eversion     (Blank rows = not tested)  LOWER EXTREMITY MMT:  MMT Right eval Left eval  Hip flexion 4+ 4+  Hip extension 4- 4-  Hip abduction 3+ 3  Hip adduction 3 3  Hip internal rotation    Hip external rotation    Knee flexion 4+ 4+  Knee extension 5 4+  Ankle dorsiflexion    Ankle plantarflexion     Ankle inversion    Ankle eversion     (Blank rows = not tested)  LOWER EXTREMITY SPECIAL TESTS:  Hip special tests: Saralyn Pilar (FABER) test: negative, Thomas test: negative, and Craig's test: negative  FUNCTIONAL TESTS:  N/a  GAIT: Distance walked: 150' Assistive device utilized: None Level of assistance: Complete Independence Comments: WFL    TODAY'S TREATMENT: 07/10/22: THEREX   Nustep L6 x 5 min UEs/LEs   Supine   Quadriceps stretch with strap x30 sec R&L   Hamstring stretch with strap x30 sec R&L   Figure 4 stretch x30 sec R&L   SLR + ER 2x10   SLR 2x10   Ball squeeze + bridge 2x10    Sidelying   Clamshell green TB 2x10    Leg press 12 plates DL 5M84   1/32/44: THEREX Nustep L6 x 5 min UEs/LEs  Supine Quadriceps stretch with strap 2x30 sec R&L Hamstring stretch with strap x30 sec R&L SLR + hip ER 2x10 SLR 2x10 Bridging 2x10 Ball squeeze 2x10  Sidelying Clamshell red TB 2x10  Sitting Hamstring stretch x30 sec R&L Hip flexor stretch x30 sec R&L Piriformis stretch x30 sec R&L  Standing Heel raise 2x10      PATIENT EDUCATION:  Education details: Exam findings, POC, and initial HEP Person educated: Patient Education method: Explanation, Demonstration, and Handouts Education comprehension: verbalized understanding, returned demonstration, and needs further education   HOME EXERCISE PROGRAM: Access Code: [email or web address] URL: [email or web address] Date: 06/30/2022 Prepared by: Estill Bamberg April Thurnell Garbe  Exercises - Supine Quadriceps Stretch with Strap on Table  - 1 x daily - 7 x weekly - 2 sets - 30 sec hold - Seated Hamstring Stretch  - 1 x daily - 7 x weekly - 2 sets - 30 sec hold - Hooklying Hamstring Stretch with Strap  - 1 x daily - 7 x weekly - 2 sets - 30 sec hold - Straight Leg Raise with External Rotation  - 1 x daily - 7 x weekly - 2 sets - 10 reps - Clamshell with Resistance  - 1 x daily - 7 x weekly - 2 sets - 10 reps -  Supine Bridge  - 1 x daily - 7 x weekly - 2 sets - 10 reps - Supine Hip Adduction Isometric with Ball  - 1 x daily - 7 x weekly - 2 sets - 10 reps  ASSESSMENT:  CLINICAL IMPRESSION: 07/10/22: Session focused on reviewing pt's HEP since she has been out on vacation. Worked on progressive strengthening this session. Pt now able to tolerate green TB.   From eval: Patient is a 46 y.o. F who was seen today for physical therapy evaluation and treatment for bilat knee pain. Assessment significant for L>R knee pain with L>R hamstring tightness,  and bilat hip weakness affecting amb and transfers. Pt would benefit from PT to address these issues for return to pain free walking and community activities.    OBJECTIVE IMPAIRMENTS decreased activity tolerance, decreased endurance, decreased mobility, difficulty walking, decreased strength, increased fascial restrictions, improper body mechanics, postural dysfunction, obesity, and pain.   ACTIVITY LIMITATIONS standing, transfers, and locomotion level  PARTICIPATION LIMITATIONS: shopping, community activity, and yard work  PERSONAL FACTORS Fitness and Past/current experiences are also affecting patient's functional outcome.   REHAB POTENTIAL: Good  CLINICAL DECISION MAKING: Stable/uncomplicated  EVALUATION COMPLEXITY: Low   GOALS: Goals reviewed with patient? Yes   LONG TERM GOALS: Target date: 08/07/2022   Pt will be ind with continuing and advancing HEP at home and in community Baseline:  Goal status: IN PROGRESS  2.  Pt will report reduction in pain by at least 50% Baseline:  Goal status: IN PROGRESS  3.  Pt will be able to return to ambulating at least 1 mile for return to normal activities Baseline:  Goal status: IN PROGRESS  4.  Pt will demo at least 4+/5 bilat LE strength Baseline:  Goal status: IN PROGRESS  5.  Pt will have increased FOTO score to at least 71 Baseline: 52 Goal status: IN PROGRESS     PLAN: PT FREQUENCY:  2x/week  PT DURATION: 6 weeks  PLANNED INTERVENTIONS: Therapeutic exercises, Therapeutic activity, Neuromuscular re-education, Balance training, Gait training, Patient/Family education, Self Care, Joint mobilization, Aquatic Therapy, Dry Needling, Electrical stimulation, Cryotherapy, Moist heat, Taping, Ionotophoresis '4mg'$ /ml Dexamethasone, and Manual therapy  PLAN FOR NEXT SESSION: Assess response to HEP. Continue hip/knee strengthening. Aquatics?   Jaidyn Kuhl April Ma L Darla Mcdonald, PT, DPT 07/10/2022, 8:01 AM

## 2022-07-12 ENCOUNTER — Encounter: Payer: Self-pay | Admitting: Bariatrics

## 2022-07-12 ENCOUNTER — Ambulatory Visit: Payer: BC Managed Care – PPO | Admitting: Bariatrics

## 2022-07-12 VITALS — BP 121/73 | HR 83 | Temp 97.6°F | Ht 66.0 in | Wt 305.0 lb

## 2022-07-12 DIAGNOSIS — R5383 Other fatigue: Secondary | ICD-10-CM

## 2022-07-12 DIAGNOSIS — M170 Bilateral primary osteoarthritis of knee: Secondary | ICD-10-CM | POA: Diagnosis not present

## 2022-07-12 DIAGNOSIS — Z9884 Bariatric surgery status: Secondary | ICD-10-CM

## 2022-07-12 DIAGNOSIS — E611 Iron deficiency: Secondary | ICD-10-CM | POA: Diagnosis not present

## 2022-07-12 DIAGNOSIS — Z6841 Body Mass Index (BMI) 40.0 and over, adult: Secondary | ICD-10-CM

## 2022-07-12 DIAGNOSIS — Z0289 Encounter for other administrative examinations: Secondary | ICD-10-CM

## 2022-07-12 DIAGNOSIS — E669 Obesity, unspecified: Secondary | ICD-10-CM

## 2022-07-14 ENCOUNTER — Encounter: Payer: Self-pay | Admitting: Physical Therapy

## 2022-07-14 ENCOUNTER — Ambulatory Visit: Payer: BC Managed Care – PPO | Admitting: Physical Therapy

## 2022-07-14 DIAGNOSIS — M170 Bilateral primary osteoarthritis of knee: Secondary | ICD-10-CM

## 2022-07-14 DIAGNOSIS — R2689 Other abnormalities of gait and mobility: Secondary | ICD-10-CM | POA: Diagnosis not present

## 2022-07-14 DIAGNOSIS — M6281 Muscle weakness (generalized): Secondary | ICD-10-CM

## 2022-07-14 DIAGNOSIS — R262 Difficulty in walking, not elsewhere classified: Secondary | ICD-10-CM

## 2022-07-14 NOTE — Therapy (Signed)
OUTPATIENT PHYSICAL THERAPY TREATMENT   Patient Name: Lisa Tran MRN: [id number] DOB:09-06-1976, 46 y.o., female Today's Date: 07/14/2022   PT End of Session - 07/14/22 0801     Visit Number 4    Number of Visits 12    Date for PT Re-Evaluation 08/07/22    Authorization Type BCBS    PT Start Time 0801    PT Stop Time 0845    PT Time Calculation (min) 44 min    Activity Tolerance Patient tolerated treatment well    Behavior During Therapy WFL for tasks assessed/performed             Past Medical History:  Diagnosis Date   Allergy    Asthma    Past Surgical History:  Procedure Laterality Date   COSMETIC SURGERY     GASTRIC BYPASS  2003   KNEE SURGERY  2014   Patient Active Problem List   Diagnosis Date Noted   Other fatigue 07/12/2022   Class 3 severe obesity with serious comorbidity and body mass index (BMI) of 45.0 to 49.9 in adult ([place]) 07/12/2022   Primary osteoarthritis of both knees 06/21/2022   Family history of colon cancer 09/23/2020   Fibroids 10/15/2017   Thrombophlebitis of superficial veins of left lower extremity 07/21/2017   Cervical paraspinal muscle spasm 10/05/2015   Iron deficiency 03/16/2015   Other specified postprocedural states 02/25/2015   Morbid obesity ([place]) 02/25/2015   H/O gastric bypass 02/25/2015    PCP: Samuel Bouche, NP  REFERRING PROVIDER: Rosemarie Ax, MD   REFERRING DIAG: M17.0 (ICD-10-CM) - Primary osteoarthritis of both knees   THERAPY DIAG:  Primary osteoarthritis of both knees  Difficulty in walking, not elsewhere classified  Muscle weakness (generalized)  Other abnormalities of gait and mobility  Rationale for Evaluation and Treatment Rehabilitation  ONSET DATE: Labor Day weekend  SUBJECTIVE:   SUBJECTIVE STATEMENT: 07/10/22: Pt reports 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 Pt has been trying to do more stretching at work. Has been able to do some strengthening. Pt reports knee is so so.    From eval: Pt notes that she was in the park on Labor Day weekend and her left knee flared up. It got worse. No method of injury. X-ray performed and imaging demonstrated arthritis. Pt is now starting to feel it in her right knee too. Started celebrex and went to Sports Medicine who recommended PT. Pt does state her pain has improved with celebrex. Pt is a [employer] of [employer]  PERTINENT HISTORY: Has a history of knee surgery on the right side.   PAIN:  Are you having pain? Yes: NPRS scale: 1/10 Pain location: bilat knees Pain description: "tender" Aggravating factors: Increased weight bearing; after sitting too long and first standing up Relieving factors: No weight  PRECAUTIONS: None  WEIGHT BEARING RESTRICTIONS No  FALLS:  Has patient fallen in last 6 months? No  LIVING ENVIRONMENT: Lives with: lives with their family and lives with their spouse Lives in: House/apartment Stairs: Yes: Internal: 15 steps; on right going up Has following equipment at home: None  OCCUPATION: Desk work  PLOF: Independent  PATIENT GOALS Return back to walking   OBJECTIVE:   FROM EVAL  DIAGNOSTIC FINDINGS: X-rays indicate knee OA  PATIENT SURVEYS:  FOTO 52; predicted 15  COGNITION:  Overall cognitive status: Within functional limits for tasks assessed     SENSATION: WFL  EDEMA:  None  MUSCLE LENGTH: Hamstrings: Right 80 deg;  Left 45 deg  POSTURE: No Significant postural limitations  PALPATION: TTP medial L knee > R knee  LOWER EXTREMITY ROM:  Active ROM Right eval Left eval  Hip flexion    Hip extension    Hip abduction    Hip adduction    Hip internal rotation    Hip external rotation    Knee flexion 105 105  Knee extension 0 0  Ankle dorsiflexion    Ankle plantarflexion    Ankle inversion    Ankle eversion     (Blank rows = not tested)  LOWER EXTREMITY MMT:  MMT Right eval Left eval  Hip flexion 4+ 4+  Hip extension 4- 4-  Hip abduction 3+ 3  Hip  adduction 3 3  Hip internal rotation    Hip external rotation    Knee flexion 4+ 4+  Knee extension 5 4+  Ankle dorsiflexion    Ankle plantarflexion    Ankle inversion    Ankle eversion     (Blank rows = not tested)  LOWER EXTREMITY SPECIAL TESTS:  Hip special tests: Saralyn Pilar (FABER) test: negative, Thomas test: negative, and Craig's test: negative  FUNCTIONAL TESTS:  N/a    TODAY'S TREATMENT: 07/14/22: THEREX   Nustep L5 x 5 min UEs/LEs   Leg press 14 plates DL [id number]      Standing   Hamstring stretch x30 sec bilat   Hip flexor stretch 2x30 sec bilat   Quad stretch 2x30 sec bilat   Hip abd green TB 2x10   Hip ext green TB 2x10   Hamstring curl green TB 2x10    Seated   LAQ green TB 2x10   07/10/22: THEREX   Nustep L6 x 5 min UEs/LEs   Supine   Quadriceps stretch with strap x30 sec R&L   Hamstring stretch with strap x30 sec R&L   Figure 4 stretch x30 sec R&L   SLR + ER 2x10   SLR 2x10   Ball squeeze + bridge 2x10    Sidelying   Clamshell green TB 2x10    Leg press 12 plates DL [id number]   0/27/74: THEREX Nustep L6 x 5 min UEs/LEs  Supine Quadriceps stretch with strap 2x30 sec R&L Hamstring stretch with strap x30 sec R&L SLR + hip ER 2x10 SLR 2x10 Bridging 2x10 Ball squeeze 2x10  Sidelying Clamshell red TB 2x10  Sitting Hamstring stretch x30 sec R&L Hip flexor stretch x30 sec R&L Piriformis stretch x30 sec R&L  Standing Heel raise 2x10      PATIENT EDUCATION:  Education details: Exam findings, POC, and initial HEP Person educated: Patient Education method: Explanation, Demonstration, and Handouts Education comprehension: verbalized understanding, returned demonstration, and needs further education   HOME EXERCISE PROGRAM: Access Code: [email or web address] URL: [email or web address] Date: 07/14/2022 Prepared by: Estill Bamberg April Thurnell Garbe  Exercises - Supine Quadriceps Stretch with Strap on Table  - 1 x daily - 7 x weekly - 2 sets -  30 sec hold - Seated Hamstring Stretch  - 1 x daily - 7 x weekly - 2 sets - 30 sec hold - Hooklying Hamstring Stretch with Strap  - 1 x daily - 7 x weekly - 2 sets - 30 sec hold - Seated Figure 4 Piriformis Stretch  - 1 x daily - 7 x weekly - 2 sets - 30 sec hold - Straight Leg Raise with External Rotation  - 1 x daily - 7 x weekly - 2 sets - 10 reps - Clamshell with Resistance  -  1 x daily - 7 x weekly - 2 sets - 10 reps - Supine Bridge with Mini Swiss Ball Between Knees  - 1 x daily - 7 x weekly - 3 sets - 10 reps - Hip Abduction with Resistance Loop  - 1 x daily - 7 x weekly - 2 sets - 10 reps - Hip Extension with Resistance Loop  - 1 x daily - 7 x weekly - 2 sets - 10 reps - Standing Hamstring Curl with Resistance  - 1 x daily - 7 x weekly - 2 sets - 10 reps - Seated Knee Extension with Resistance  - 1 x daily - 7 x weekly - 2 sets - 10 reps  ASSESSMENT:  CLINICAL IMPRESSION: 07/14/22: Focused on providing pt exercises she can do at work. Initiated standing exercises with good pt tolerance and form. Pt is demonstrating improving strength -- able to increase weight on leg press. L LE remains slightly weaker than R.   From eval: Patient is a 46 y.o. F who was seen today for physical therapy evaluation and treatment for bilat knee pain. Assessment significant for L>R knee pain with L>R hamstring tightness, and bilat hip weakness affecting amb and transfers. Pt would benefit from PT to address these issues for return to pain free walking and community activities.    OBJECTIVE IMPAIRMENTS decreased activity tolerance, decreased endurance, decreased mobility, difficulty walking, decreased strength, increased fascial restrictions, improper body mechanics, postural dysfunction, obesity, and pain.   ACTIVITY LIMITATIONS standing, transfers, and locomotion level  PARTICIPATION LIMITATIONS: shopping, community activity, and yard work  PERSONAL FACTORS Fitness and Past/current experiences are also  affecting patient's functional outcome.   REHAB POTENTIAL: Good  CLINICAL DECISION MAKING: Stable/uncomplicated  EVALUATION COMPLEXITY: Low   GOALS: Goals reviewed with patient? Yes   LONG TERM GOALS: Target date: 08/07/2022   Pt will be ind with continuing and advancing HEP at home and in community Baseline:  Goal status: IN PROGRESS  2.  Pt will report reduction in pain by at least 50% Baseline:  Goal status: IN PROGRESS  3.  Pt will be able to return to ambulating at least 1 mile for return to normal activities Baseline:  Goal status: IN PROGRESS  4.  Pt will demo at least 4+/5 bilat LE strength Baseline:  Goal status: IN PROGRESS  5.  Pt will have increased FOTO score to at least 71 Baseline: 52 Goal status: IN PROGRESS     PLAN: PT FREQUENCY: 2x/week  PT DURATION: 6 weeks  PLANNED INTERVENTIONS: Therapeutic exercises, Therapeutic activity, Neuromuscular re-education, Balance training, Gait training, Patient/Family education, Self Care, Joint mobilization, Aquatic Therapy, Dry Needling, Electrical stimulation, Cryotherapy, Moist heat, Taping, Ionotophoresis '4mg'$ /ml Dexamethasone, and Manual therapy  PLAN FOR NEXT SESSION: Assess response to HEP. Continue hip/knee strengthening. Aquatics   Romaldo Saville April Ma L Etan Vasudevan, PT, DPT 07/14/2022, 8:01 AM

## 2022-07-17 ENCOUNTER — Ambulatory Visit: Payer: BC Managed Care – PPO | Admitting: Physical Therapy

## 2022-07-17 ENCOUNTER — Encounter: Payer: Self-pay | Admitting: Physical Therapy

## 2022-07-17 DIAGNOSIS — M170 Bilateral primary osteoarthritis of knee: Secondary | ICD-10-CM

## 2022-07-17 DIAGNOSIS — R262 Difficulty in walking, not elsewhere classified: Secondary | ICD-10-CM

## 2022-07-17 DIAGNOSIS — R2689 Other abnormalities of gait and mobility: Secondary | ICD-10-CM | POA: Diagnosis not present

## 2022-07-17 DIAGNOSIS — M6281 Muscle weakness (generalized): Secondary | ICD-10-CM

## 2022-07-17 NOTE — Therapy (Signed)
OUTPATIENT PHYSICAL THERAPY TREATMENT   Patient Name: Lisa Tran MRN: [id number] DOB:1976/01/02, 46 y.o., female Today's Date: 07/17/2022   PT End of Session - 07/17/22 0801     Visit Number 5    Number of Visits 12    Date for PT Re-Evaluation 08/07/22    Authorization Type BCBS    PT Start Time 0801    PT Stop Time 0845    PT Time Calculation (min) 44 min    Activity Tolerance Patient tolerated treatment well    Behavior During Therapy WFL for tasks assessed/performed             Past Medical History:  Diagnosis Date   Allergy    Asthma    Past Surgical History:  Procedure Laterality Date   COSMETIC SURGERY     GASTRIC BYPASS  2003   KNEE SURGERY  2014   Patient Active Problem List   Diagnosis Date Noted   Other fatigue 07/12/2022   Class 3 severe obesity with serious comorbidity and body mass index (BMI) of 45.0 to 49.9 in adult ([place]) 07/12/2022   Primary osteoarthritis of both knees 06/21/2022   Family history of colon cancer 09/23/2020   Fibroids 10/15/2017   Thrombophlebitis of superficial veins of left lower extremity 07/21/2017   Cervical paraspinal muscle spasm 10/05/2015   Iron deficiency 03/16/2015   Other specified postprocedural states 02/25/2015   Morbid obesity ([place]) 02/25/2015   H/O gastric bypass 02/25/2015    PCP: Samuel Bouche, NP  REFERRING PROVIDER: Rosemarie Ax, MD   REFERRING DIAG: M17.0 (ICD-10-CM) - Primary osteoarthritis of both knees   THERAPY DIAG:  Primary osteoarthritis of both knees  Difficulty in walking, not elsewhere classified  Muscle weakness (generalized)  Other abnormalities of gait and mobility  Rationale for Evaluation and Treatment Rehabilitation  ONSET DATE: Labor Day weekend  SUBJECTIVE:   SUBJECTIVE STATEMENT: 07/17/22: Pt reports she had a good weekend.   From eval: Pt notes that she was in the park on [place] weekend and her left knee flared up. It got worse. No method of injury. X-ray  performed and imaging demonstrated arthritis. Pt is now starting to feel it in her right knee too. Started celebrex and went to Sports Medicine who recommended PT. Pt does state her pain has improved with celebrex. Pt is a member of the [employer].  PERTINENT HISTORY: Has a history of knee surgery on the right side.   PAIN:  Are you having pain? Yes: NPRS scale: 1/10 Pain location: bilat knees Pain description: "tender" Aggravating factors: Increased weight bearing; after sitting too long and first standing up Relieving factors: No weight  PRECAUTIONS: None  WEIGHT BEARING RESTRICTIONS No  FALLS:  Has patient fallen in last 6 months? No  LIVING ENVIRONMENT: Lives with: lives with their family and lives with their spouse Lives in: House/apartment Stairs: Yes: Internal: 15 steps; on right going up Has following equipment at home: None  OCCUPATION: Desk work  PLOF: Independent  PATIENT GOALS Return back to walking   OBJECTIVE:   FROM EVAL  DIAGNOSTIC FINDINGS: X-rays indicate knee OA  PATIENT SURVEYS:  FOTO 52; predicted 92  COGNITION:  Overall cognitive status: Within functional limits for tasks assessed     SENSATION: WFL  EDEMA:  None  MUSCLE LENGTH: Hamstrings: Right 80 deg; Left 45 deg  POSTURE: No Significant postural limitations  PALPATION: TTP medial L knee > R knee  LOWER EXTREMITY ROM:  Active ROM Right eval Left eval  Hip flexion    Hip extension    Hip abduction    Hip adduction    Hip internal rotation    Hip external rotation    Knee flexion 105 105  Knee extension 0 0  Ankle dorsiflexion    Ankle plantarflexion    Ankle inversion    Ankle eversion     (Blank rows = not tested)  LOWER EXTREMITY MMT:  MMT Right eval Left eval  Hip flexion 4+ 4+  Hip extension 4- 4-  Hip abduction 3+ 3  Hip adduction 3 3  Hip internal rotation    Hip external rotation    Knee flexion 4+ 4+  Knee extension 5 4+  Ankle dorsiflexion    Ankle  plantarflexion    Ankle inversion    Ankle eversion     (Blank rows = not tested)  LOWER EXTREMITY SPECIAL TESTS:  Hip special tests: Saralyn Pilar (FABER) test: negative, Thomas test: negative, and Craig's test: negative  FUNCTIONAL TESTS:  N/a    TODAY'S TREATMENT: 07/17/22: THEREX   Nustep L5 x 5 min UEs/LEs     Standing   Hip flexor stretch 2x30 sec bilat   Hamstring stretch 2x30 sec bilat   Quad stretch 2x30 sec bilat   Heel/toe 2x10   Hip abd green TB 2x10   Hip ext green TB 2x10   Hamstring curl green TB 2x10   Wall squat 2x10      07/14/22: THEREX   Nustep L5 x 5 min UEs/LEs   Leg press 14 plates DL [id number]      Standing   Hamstring stretch x30 sec bilat   Hip flexor stretch 2x30 sec bilat   Quad stretch 2x30 sec bilat   Hip abd green TB 2x10   Hip ext green TB 2x10   Hamstring curl green TB 2x10    Seated   LAQ green TB 2x10   07/10/22: THEREX   Nustep L6 x 5 min UEs/LEs   Supine   Quadriceps stretch with strap x30 sec R&L   Hamstring stretch with strap x30 sec R&L   Figure 4 stretch x30 sec R&L   SLR + ER 2x10   SLR 2x10   Ball squeeze + bridge 2x10    Sidelying   Clamshell green TB 2x10    Leg press 12 plates DL [id number]     PATIENT EDUCATION:  Education details: Exam findings, POC, and initial HEP Person educated: Patient Education method: Explanation, Demonstration, and Handouts Education comprehension: verbalized understanding, returned demonstration, and needs further education   HOME EXERCISE PROGRAM: Access Code: [email or web address] URL: [email or web address] Date: 07/14/2022 Prepared by: Estill Bamberg April Thurnell Garbe  Exercises - Supine Quadriceps Stretch with Strap on Table  - 1 x daily - 7 x weekly - 2 sets - 30 sec hold - Seated Hamstring Stretch  - 1 x daily - 7 x weekly - 2 sets - 30 sec hold - Hooklying Hamstring Stretch with Strap  - 1 x daily - 7 x weekly - 2 sets - 30 sec hold - Seated Figure 4 Piriformis Stretch  - 1 x  daily - 7 x weekly - 2 sets - 30 sec hold - Straight Leg Raise with External Rotation  - 1 x daily - 7 x weekly - 2 sets - 10 reps - Clamshell with Resistance  - 1 x daily - 7 x weekly - 2 sets - 10 reps - Supine Bridge with Mini Swiss Ball Between Knees  - 1  x daily - 7 x weekly - 3 sets - 10 reps - Hip Abduction with Resistance Loop  - 1 x daily - 7 x weekly - 2 sets - 10 reps - Hip Extension with Resistance Loop  - 1 x daily - 7 x weekly - 2 sets - 10 reps - Standing Hamstring Curl with Resistance  - 1 x daily - 7 x weekly - 2 sets - 10 reps - Seated Knee Extension with Resistance  - 1 x daily - 7 x weekly - 2 sets - 10 reps  ASSESSMENT:  CLINICAL IMPRESSION: 07/17/22: Initiated body weight wall squats this session with good pt tolerance. Reviewed green TB exercises. Will likely progress next session to higher resistance.   From eval: Patient is a 46 y.o. F who was seen today for physical therapy evaluation and treatment for bilat knee pain. Assessment significant for L>R knee pain with L>R hamstring tightness, and bilat hip weakness affecting amb and transfers. Pt would benefit from PT to address these issues for return to pain free walking and community activities.    OBJECTIVE IMPAIRMENTS decreased activity tolerance, decreased endurance, decreased mobility, difficulty walking, decreased strength, increased fascial restrictions, improper body mechanics, postural dysfunction, obesity, and pain.   ACTIVITY LIMITATIONS standing, transfers, and locomotion level  PARTICIPATION LIMITATIONS: shopping, community activity, and yard work  PERSONAL FACTORS Fitness and Past/current experiences are also affecting patient's functional outcome.   REHAB POTENTIAL: Good  CLINICAL DECISION MAKING: Stable/uncomplicated  EVALUATION COMPLEXITY: Low   GOALS: Goals reviewed with patient? Yes   LONG TERM GOALS: Target date: 08/07/2022   Pt will be ind with continuing and advancing HEP at home and  in community Baseline:  Goal status: IN PROGRESS  2.  Pt will report reduction in pain by at least 50% Baseline:  Goal status: IN PROGRESS  3.  Pt will be able to return to ambulating at least 1 mile for return to normal activities Baseline:  Goal status: IN PROGRESS  4.  Pt will demo at least 4+/5 bilat LE strength Baseline:  Goal status: IN PROGRESS  5.  Pt will have increased FOTO score to at least 71 Baseline: 52 Goal status: IN PROGRESS     PLAN: PT FREQUENCY: 2x/week  PT DURATION: 6 weeks  PLANNED INTERVENTIONS: Therapeutic exercises, Therapeutic activity, Neuromuscular re-education, Balance training, Gait training, Patient/Family education, Self Care, Joint mobilization, Aquatic Therapy, Dry Needling, Electrical stimulation, Cryotherapy, Moist heat, Taping, Ionotophoresis '4mg'$ /ml Dexamethasone, and Manual therapy  PLAN FOR NEXT SESSION: Progress to body weight exercises and lifting. Continue hip/knee strengthening. Aquatics   Lamekia Nolden April Ma L Chandlar Staebell, PT, DPT 07/17/2022, 8:01 AM

## 2022-07-18 ENCOUNTER — Encounter: Payer: Self-pay | Admitting: Bariatrics

## 2022-07-18 NOTE — Progress Notes (Signed)
Chief Complaint:   OBESITY Lisa Tran is here to discuss her progress with her obesity treatment plan along with follow-up of her obesity related diagnoses.   Today's visit was #: 1 Starting weight: 305 lbs Starting date: 07/12/2022 Today's weight: 305 lbs Today's date: 07/12/2022 Total lbs lost to date: 0 Total lbs lost since last in-office visit: 0  Interim History: Lisa Tran is here for an information/consult session.  She was referred by Lisa Bouche, NP.  Subjective:   1. 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  She denies restriction.  Her highest weight was 240 pounds, lowest weight was 201-year ago.  2. Other fatigue Lisa Tran notes fatigue with certain activities.   3. Iron deficiency Lisa Tran is taking iron and multivitamins. She is not on iron infusions secondary to fibroids.   4. Primary osteoarthritis of both knees Lisa Tran is taking diclofenac sodium.   Assessment/Plan:   1. H/O gastric bypass Lisa Tran will continue her iron and multivitamins. She is to eat small frequent meals.   2. Other fatigue We will do an EKG at her next visit.   - EKG 12-Lead  3. Iron deficiency Lisa Tran will continue her iron, and will continue iron rich foods.   4. Primary osteoarthritis of both knees Lisa Tran will continue her medication as needed.   5. Obesity, Current BMI 49.3 Lisa Tran is currently in the action stage of change. As such, her goal is to continue with weight loss efforts.   Reviewed bioimpedance results with the patient and discussed calories and protein.  Meal planning was also discussed.  Exercise goals: Continue going to physical therapy for her knees.  Recommended water aerobics.  Behavioral modification strategies: increasing lean protein intake, decreasing simple carbohydrates, increasing vegetables, increasing water intake, decreasing eating out, no skipping meals, meal planning and cooking strategies, and keeping healthy  foods in the home.  Lisa Tran has agreed to follow-up with our clinic in 2 weeks. She was informed of the importance of frequent follow-up visits to maximize her success with intensive lifestyle modifications for her multiple health conditions.   Objective:   Blood pressure 121/73, pulse 83, temperature 97.6 F (36.4 C), height '5\' 6"'$  (1.676 m), weight (!) 305 lb (138.3 kg), SpO2 100 %. Body mass index is 49.23 kg/m.  General: Cooperative, alert, well developed, in no acute distress. HEENT: Conjunctivae and lids unremarkable. Cardiovascular: Regular rhythm.  Lungs: Normal work of breathing. Neurologic: No focal deficits.   Lab Results  Component Value Date   CREATININE 0.75 02/09/2022   BUN 15 02/09/2022   NA 141 02/09/2022   K 4.3 02/09/2022   CL 103 02/09/2022   CO2 18 (L) 02/09/2022   Lab Results  Component Value Date   ALT 27 02/09/2022   AST 32 02/09/2022   ALKPHOS 71 04/24/2017   BILITOT 0.6 02/09/2022   Lab Results  Component Value Date   HGBA1C 5.4 02/09/2022   No results found for: "INSULIN" Lab Results  Component Value Date   TSH 1.64 02/09/2022   Lab Results  Component Value Date   CHOL 196 02/09/2022   HDL 100 02/09/2022   LDLCALC 81 02/09/2022   TRIG 66 02/09/2022   CHOLHDL 2.0 02/09/2022   Lab Results  Component Value Date   VD25OH 72 02/09/2022   VD25OH 43 09/27/2020   VD25OH 27 (L) 09/20/2018   Lab Results  Component Value Date   WBC 5.1 02/09/2022   HGB 13.9 02/09/2022   HCT 41.1  02/09/2022   MCV 89.9 02/09/2022   PLT 352 02/09/2022   Lab Results  Component Value Date   IRON 115 02/09/2022   TIBC 334 02/09/2022   FERRITIN 160 02/09/2022   Attestation Statements:   Reviewed by clinician on day of visit: allergies, medications, problem list, medical history, surgical history, family history, social history, and previous encounter notes.   Lisa Tran, am acting as Location manager for [employer], DO.  I have reviewed the  above documentation for accuracy and completeness, and I agree with the above. Lisa Lesch, DO

## 2022-07-21 ENCOUNTER — Ambulatory Visit: Payer: BC Managed Care – PPO | Admitting: Physical Therapy

## 2022-07-21 ENCOUNTER — Encounter: Payer: Self-pay | Admitting: Physical Therapy

## 2022-07-21 DIAGNOSIS — M6281 Muscle weakness (generalized): Secondary | ICD-10-CM | POA: Diagnosis not present

## 2022-07-21 DIAGNOSIS — R262 Difficulty in walking, not elsewhere classified: Secondary | ICD-10-CM

## 2022-07-21 DIAGNOSIS — R2689 Other abnormalities of gait and mobility: Secondary | ICD-10-CM

## 2022-07-21 DIAGNOSIS — M170 Bilateral primary osteoarthritis of knee: Secondary | ICD-10-CM

## 2022-07-21 NOTE — Therapy (Signed)
OUTPATIENT PHYSICAL THERAPY TREATMENT   Patient Name: Lisa Tran MRN: [id number] DOB:1976/07/20, 46 y.o., female Today's Date: 07/21/2022   PT End of Session - 07/21/22 0803     Visit Number 6    Number of Visits 12    Date for PT Re-Evaluation 08/07/22    Authorization Type BCBS    PT Start Time 0803    PT Stop Time 0845    PT Time Calculation (min) 42 min    Activity Tolerance Patient tolerated treatment well    Behavior During Therapy [facility] for tasks assessed/performed             Past Medical History:  Diagnosis Date   Allergy    Asthma    Past Surgical History:  Procedure Laterality Date   COSMETIC SURGERY     GASTRIC BYPASS  2003   KNEE SURGERY  2014   Patient Active Problem List   Diagnosis Date Noted   Other fatigue 07/12/2022   Class 3 severe obesity with serious comorbidity and body mass index (BMI) of 45.0 to 49.9 in adult ([place]) 07/12/2022   Primary osteoarthritis of both knees 06/21/2022   Family history of colon cancer 09/23/2020   Fibroids 10/15/2017   Thrombophlebitis of superficial veins of left lower extremity 07/21/2017   Cervical paraspinal muscle spasm 10/05/2015   Iron deficiency 03/16/2015   Other specified postprocedural states 02/25/2015   Morbid obesity ([place]) 02/25/2015   H/O gastric bypass 02/25/2015    PCP: Samuel Bouche, NP  REFERRING PROVIDER: Rosemarie Ax, MD   REFERRING DIAG: M17.0 (ICD-10-CM) - Primary osteoarthritis of both knees   THERAPY DIAG:  Primary osteoarthritis of both knees  Difficulty in walking, not elsewhere classified  Muscle weakness (generalized)  Other abnormalities of gait and mobility  Rationale for Evaluation and Treatment Rehabilitation  ONSET DATE: Labor Day weekend  SUBJECTIVE:   SUBJECTIVE STATEMENT: 07/21/22: Pt reports that the wall squats made her very sore and made her knees hurt the next day. Pt reports feeling more stiffness rather than pain.   From eval: Pt notes that she  was in the park on [place] weekend and her left knee flared up. It got worse. No method of injury. X-ray performed and imaging demonstrated arthritis. Pt is now starting to feel it in her right knee too. Started celebrex and went to Sports Medicine who recommended PT. Pt does state her pain has improved with celebrex. Pt is a [employer] of [employer]  PERTINENT HISTORY: Has a history of knee surgery on the right side.   PAIN:  Are you having pain? Yes: NPRS scale: 1/10 Pain location: bilat knees Pain description: "tender" Aggravating factors: Increased weight bearing; after sitting too long and first standing up Relieving factors: No weight  PRECAUTIONS: None  WEIGHT BEARING RESTRICTIONS No  FALLS:  Has patient fallen in last 6 months? No  LIVING ENVIRONMENT: Lives with: lives with their family and lives with their spouse Lives in: House/apartment Stairs: Yes: Internal: 15 steps; on right going up Has following equipment at home: None  OCCUPATION: Desk work  PLOF: Independent  PATIENT GOALS Return back to walking   OBJECTIVE:   FROM EVAL  DIAGNOSTIC FINDINGS: X-rays indicate knee OA  PATIENT SURVEYS:  FOTO 52; predicted 57  COGNITION:  Overall cognitive status: Within functional limits for tasks assessed     SENSATION: WFL  EDEMA:  None  MUSCLE LENGTH: Hamstrings: Right 80 deg; Left 45 deg  POSTURE: No Significant postural limitations  PALPATION: TTP medial L knee > R knee  LOWER EXTREMITY ROM:  Active ROM Right eval Left eval  Hip flexion    Hip extension    Hip abduction    Hip adduction    Hip internal rotation    Hip external rotation    Knee flexion 105 105  Knee extension 0 0  Ankle dorsiflexion    Ankle plantarflexion    Ankle inversion    Ankle eversion     (Blank rows = not tested)  LOWER EXTREMITY MMT:  MMT Right eval Left eval  Hip flexion 4+ 4+  Hip extension 4- 4-  Hip abduction 3+ 3  Hip adduction 3 3  Hip internal rotation     Hip external rotation    Knee flexion 4+ 4+  Knee extension 5 4+  Ankle dorsiflexion    Ankle plantarflexion    Ankle inversion    Ankle eversion     (Blank rows = not tested)  LOWER EXTREMITY SPECIAL TESTS:  Hip special tests: Saralyn Pilar (FABER) test: negative, Thomas test: negative, and Craig's test: negative  FUNCTIONAL TESTS:  N/a    TODAY'S TREATMENT: 07/21/22: THEREX   Nustep L6 x 5 min UEs/LEs      Standing   Hip flexor stretch 2x30 sec bilat   Hamstring stretch 2x30 sec   Quad stretch 2x30 sec   Heel/toe 2x10   Side step 3x15'   Forwards/backwards monster walk 3x15'    Sitting   Figure 4 stretch x30 sec bilat    Supine   SLR 2x10 red TB   SAQ 2x10x3 sec red TB    Prone   Hamstring curl 2x10 red TB    07/17/22: THEREX   Nustep L5 x 5 min UEs/LEs     Standing   Hip flexor stretch 2x30 sec bilat   Hamstring stretch 2x30 sec bilat   Quad stretch 2x30 sec bilat   Heel/toe 2x10   Hip abd green TB 2x10   Hip ext green TB 2x10   Hamstring curl green TB 2x10   Wall squat 2x10      07/14/22: THEREX   Nustep L5 x 5 min UEs/LEs   Leg press 14 plates DL [id number]      Standing   Hamstring stretch x30 sec bilat   Hip flexor stretch 2x30 sec bilat   Quad stretch 2x30 sec bilat   Hip abd green TB 2x10   Hip ext green TB 2x10   Hamstring curl green TB 2x10    Seated   LAQ green TB 2x10    PATIENT EDUCATION:  Education details: Exam findings, POC, and initial HEP Person educated: Patient Education method: Explanation, Demonstration, and Handouts Education comprehension: verbalized understanding, returned demonstration, and needs further education   HOME EXERCISE PROGRAM: Access Code: [email or web address] URL: [email or web address] Date: 07/14/2022 Prepared by: Estill Bamberg April Thurnell Garbe  Exercises - Supine Quadriceps Stretch with Strap on Table  - 1 x daily - 7 x weekly - 2 sets - 30 sec hold - Seated Hamstring Stretch  - 1 x daily - 7 x  weekly - 2 sets - 30 sec hold - Hooklying Hamstring Stretch with Strap  - 1 x daily - 7 x weekly - 2 sets - 30 sec hold - Seated Figure 4 Piriformis Stretch  - 1 x daily - 7 x weekly - 2 sets - 30 sec hold - Straight Leg Raise with External Rotation  - 1 x daily - 7 x weekly -  2 sets - 10 reps - Clamshell with Resistance  - 1 x daily - 7 x weekly - 2 sets - 10 reps - Supine Bridge with Mini Swiss Ball Between Knees  - 1 x daily - 7 x weekly - 3 sets - 10 reps - Hip Abduction with Resistance Loop  - 1 x daily - 7 x weekly - 2 sets - 10 reps - Hip Extension with Resistance Loop  - 1 x daily - 7 x weekly - 2 sets - 10 reps - Standing Hamstring Curl with Resistance  - 1 x daily - 7 x weekly - 2 sets - 10 reps - Seated Knee Extension with Resistance  - 1 x daily - 7 x weekly - 2 sets - 10 reps  ASSESSMENT:  CLINICAL IMPRESSION: 07/21/22: Pt with too much pain and soreness next day after wall squats -- not yet ready for body weight exercises. Worked on progressive strengthening with bands.   From eval: Patient is a 46 y.o. F who was seen today for physical therapy evaluation and treatment for bilat knee pain. Assessment significant for L>R knee pain with L>R hamstring tightness, and bilat hip weakness affecting amb and transfers. Pt would benefit from PT to address these issues for return to pain free walking and community activities.    OBJECTIVE IMPAIRMENTS decreased activity tolerance, decreased endurance, decreased mobility, difficulty walking, decreased strength, increased fascial restrictions, improper body mechanics, postural dysfunction, obesity, and pain.   ACTIVITY LIMITATIONS standing, transfers, and locomotion level  PARTICIPATION LIMITATIONS: shopping, community activity, and yard work  PERSONAL FACTORS Fitness and Past/current experiences are also affecting patient's functional outcome.   REHAB POTENTIAL: Good  CLINICAL DECISION MAKING: Stable/uncomplicated  EVALUATION  COMPLEXITY: Low   GOALS: Goals reviewed with patient? Yes   LONG TERM GOALS: Target date: 08/07/2022   Pt will be ind with continuing and advancing HEP at home and in community Baseline:  Goal status: IN PROGRESS  2.  Pt will report reduction in pain by at least 50% Baseline:  Goal status: IN PROGRESS  3.  Pt will be able to return to ambulating at least 1 mile for return to normal activities Baseline:  Goal status: IN PROGRESS  4.  Pt will demo at least 4+/5 bilat LE strength Baseline:  Goal status: IN PROGRESS  5.  Pt will have increased FOTO score to at least 71 Baseline: 52 Goal status: IN PROGRESS     PLAN: PT FREQUENCY: 2x/week  PT DURATION: 6 weeks  PLANNED INTERVENTIONS: Therapeutic exercises, Therapeutic activity, Neuromuscular re-education, Balance training, Gait training, Patient/Family education, Self Care, Joint mobilization, Aquatic Therapy, Dry Needling, Electrical stimulation, Cryotherapy, Moist heat, Taping, Ionotophoresis '4mg'$ /ml Dexamethasone, and Manual therapy  PLAN FOR NEXT SESSION: Progress to body weight exercises and lifting as tolerated. Consider exercises on stairs. Continue hip/knee strengthening. Aquatics   Yishai Rehfeld April Ma L Dayne Chait, PT, DPT 07/21/2022, 8:03 AM

## 2022-07-24 ENCOUNTER — Ambulatory Visit: Payer: BC Managed Care – PPO | Admitting: Physical Therapy

## 2022-07-24 ENCOUNTER — Encounter: Payer: Self-pay | Admitting: Physical Therapy

## 2022-07-24 DIAGNOSIS — M6281 Muscle weakness (generalized): Secondary | ICD-10-CM | POA: Diagnosis not present

## 2022-07-24 DIAGNOSIS — M170 Bilateral primary osteoarthritis of knee: Secondary | ICD-10-CM

## 2022-07-24 DIAGNOSIS — R2689 Other abnormalities of gait and mobility: Secondary | ICD-10-CM | POA: Diagnosis not present

## 2022-07-24 DIAGNOSIS — R262 Difficulty in walking, not elsewhere classified: Secondary | ICD-10-CM

## 2022-07-24 NOTE — Therapy (Signed)
OUTPATIENT PHYSICAL THERAPY TREATMENT   Patient Name: Kniyah Khun MRN: [id number] DOB:[birth year], 46 y.o., female Today's Date: 07/24/2022   PT End of Session - 07/24/22 0802     Visit Number 7    Number of Visits 12    Date for PT Re-Evaluation 08/07/22    Authorization Type BCBS    PT Start Time 0802    PT Stop Time 0840    PT Time Calculation (min) 38 min    Activity Tolerance Patient tolerated treatment well    Behavior During Therapy WFL for tasks assessed/performed             Past Medical History:  Diagnosis Date   Allergy    Asthma    Past Surgical History:  Procedure Laterality Date   COSMETIC SURGERY     GASTRIC BYPASS  2003   KNEE SURGERY  2014   Patient Active Problem List   Diagnosis Date Noted   Other fatigue 07/12/2022   Class 3 severe obesity with serious comorbidity and body mass index (BMI) of 45.0 to 49.9 in adult ([place]) 07/12/2022   Primary osteoarthritis of both knees 06/21/2022   Family history of colon cancer 09/23/2020   Fibroids 10/15/2017   Thrombophlebitis of superficial veins of left lower extremity 07/21/2017   Cervical paraspinal muscle spasm 10/05/2015   Iron deficiency 03/16/2015   Other specified postprocedural states 02/25/2015   Morbid obesity ([place]) 02/25/2015   H/O gastric bypass 02/25/2015    PCP: Samuel Bouche, NP  REFERRING PROVIDER: Rosemarie Ax, MD   REFERRING DIAG: M17.0 (ICD-10-CM) - Primary osteoarthritis of both knees   THERAPY DIAG:  Primary osteoarthritis of both knees  Difficulty in walking, not elsewhere classified  Muscle weakness (generalized)  Other abnormalities of gait and mobility  Rationale for Evaluation and Treatment Rehabilitation  ONSET DATE: Labor Day weekend  SUBJECTIVE:   SUBJECTIVE STATEMENT: 07/24/22: Pt states she woke up with a lot of soreness and some pain -- planted a lot of garlic. Reports she did a lot of squatting.   From eval: Pt notes that she was in the park on  Labor Day weekend and her left knee flared up. It got worse. No method of injury. X-ray performed and imaging demonstrated arthritis. Pt is now starting to feel it in her right knee too. Started celebrex and went to Sports Medicine who recommended PT. Pt does state her pain has improved with celebrex. Pt is a member of the [employer].  PERTINENT HISTORY: Has a history of knee surgery on the right side.   PAIN:  Are you having pain? Yes: NPRS scale: 1/10 Pain location: bilat knees Pain description: "tender" Aggravating factors: Increased weight bearing; after sitting too long and first standing up Relieving factors: No weight  PRECAUTIONS: None  WEIGHT BEARING RESTRICTIONS No  FALLS:  Has patient fallen in last 6 months? No  LIVING ENVIRONMENT: Lives with: lives with their family and lives with their spouse Lives in: House/apartment Stairs: Yes: Internal: 15 steps; on right going up Has following equipment at home: None  OCCUPATION: Desk work  PLOF: Independent  PATIENT GOALS Return back to walking   OBJECTIVE:   FROM EVAL  DIAGNOSTIC FINDINGS: X-rays indicate knee OA  PATIENT SURVEYS:  FOTO 52; predicted 82  COGNITION:  Overall cognitive status: Within functional limits for tasks assessed     SENSATION: WFL  EDEMA:  None  MUSCLE LENGTH: Hamstrings: Right 80 deg; Left 45 deg  POSTURE: No Significant postural limitations  PALPATION: TTP medial L knee > R knee  LOWER EXTREMITY ROM:  Active ROM Right eval Left eval  Hip flexion    Hip extension    Hip abduction    Hip adduction    Hip internal rotation    Hip external rotation    Knee flexion 105 105  Knee extension 0 0  Ankle dorsiflexion    Ankle plantarflexion    Ankle inversion    Ankle eversion     (Blank rows = not tested)  LOWER EXTREMITY MMT:  MMT Right eval Left eval  Hip flexion 4+ 4+  Hip extension 4- 4-  Hip abduction 3+ 3  Hip adduction 3 3  Hip internal rotation    Hip external  rotation    Knee flexion 4+ 4+  Knee extension 5 4+  Ankle dorsiflexion    Ankle plantarflexion    Ankle inversion    Ankle eversion     (Blank rows = not tested)  LOWER EXTREMITY SPECIAL TESTS:  Hip special tests: Saralyn Pilar (FABER) test: negative, Thomas test: negative, and Craig's test: negative  FUNCTIONAL TESTS:  N/a    TODAY'S TREATMENT: 07/24/22: THEREX   Nustep L6 x 5 min UEs/LEs    Sitting   Hamstring stretch 2x30 sec   Figure 4 stretch 2x30 sec    Standing   Gastroc stretch 2x30 sec   Hip flexor stretch 2x30 sec   Quad stretch 2x30 sec  SELF CARE   Self massage with massage stick   Self massage and myofacial release with foam roll   K-tape self application for bilat knees  MANUAL THERAPY   K-tape bilat knees for stabilizing quads and pain relief      07/21/22: THEREX   Nustep L6 x 5 min UEs/LEs      Standing   Hip flexor stretch 2x30 sec bilat   Hamstring stretch 2x30 sec   Quad stretch 2x30 sec   Heel/toe 2x10   Side step 3x15'   Forwards/backwards monster walk 3x15'    Sitting   Figure 4 stretch x30 sec bilat    Supine   SLR 2x10 red TB   SAQ 2x10x3 sec red TB    Prone   Hamstring curl 2x10 red TB    07/17/22: THEREX   Nustep L5 x 5 min UEs/LEs     Standing   Hip flexor stretch 2x30 sec bilat   Hamstring stretch 2x30 sec bilat   Quad stretch 2x30 sec bilat   Heel/toe 2x10   Hip abd green TB 2x10   Hip ext green TB 2x10   Hamstring curl green TB 2x10   Wall squat 2x10      07/14/22: THEREX   Nustep L5 x 5 min UEs/LEs   Leg press 14 plates DL [id number]      Standing   Hamstring stretch x30 sec bilat   Hip flexor stretch 2x30 sec bilat   Quad stretch 2x30 sec bilat   Hip abd green TB 2x10   Hip ext green TB 2x10   Hamstring curl green TB 2x10    Seated   LAQ green TB 2x10    PATIENT EDUCATION:  Education details: Exam findings, POC, and initial HEP Person educated: Patient Education method: Explanation, Demonstration,  and Handouts Education comprehension: verbalized understanding, returned demonstration, and needs further education   HOME EXERCISE PROGRAM: Access Code: [email or web address] URL: [email or web address] Date: 07/14/2022 Prepared by: Estill Bamberg April Thurnell Garbe  Exercises - Supine Quadriceps Stretch with Strap on Table  -  1 x daily - 7 x weekly - 2 sets - 30 sec hold - Seated Hamstring Stretch  - 1 x daily - 7 x weekly - 2 sets - 30 sec hold - Hooklying Hamstring Stretch with Strap  - 1 x daily - 7 x weekly - 2 sets - 30 sec hold - Seated Figure 4 Piriformis Stretch  - 1 x daily - 7 x weekly - 2 sets - 30 sec hold - Straight Leg Raise with External Rotation  - 1 x daily - 7 x weekly - 2 sets - 10 reps - Clamshell with Resistance  - 1 x daily - 7 x weekly - 2 sets - 10 reps - Supine Bridge with Mini Swiss Ball Between Knees  - 1 x daily - 7 x weekly - 3 sets - 10 reps - Hip Abduction with Resistance Loop  - 1 x daily - 7 x weekly - 2 sets - 10 reps - Hip Extension with Resistance Loop  - 1 x daily - 7 x weekly - 2 sets - 10 reps - Standing Hamstring Curl with Resistance  - 1 x daily - 7 x weekly - 2 sets - 10 reps - Seated Knee Extension with Resistance  - 1 x daily - 7 x weekly - 2 sets - 10 reps  ASSESSMENT:  CLINICAL IMPRESSION: 07/24/22: Pt with increased pain and soreness after doing gardening/yard work. Session focused on educating pt on recovery techniques to decrease pain and soreness.   From eval: Patient is a 46 y.o. F who was seen today for physical therapy evaluation and treatment for bilat knee pain. Assessment significant for L>R knee pain with L>R hamstring tightness, and bilat hip weakness affecting amb and transfers. Pt would benefit from PT to address these issues for return to pain free walking and community activities.    OBJECTIVE IMPAIRMENTS decreased activity tolerance, decreased endurance, decreased mobility, difficulty walking, decreased strength, increased  fascial restrictions, improper body mechanics, postural dysfunction, obesity, and pain.   ACTIVITY LIMITATIONS standing, transfers, and locomotion level  PARTICIPATION LIMITATIONS: shopping, community activity, and yard work  PERSONAL FACTORS Fitness and Past/current experiences are also affecting patient's functional outcome.   REHAB POTENTIAL: Good  CLINICAL DECISION MAKING: Stable/uncomplicated  EVALUATION COMPLEXITY: Low   GOALS: Goals reviewed with patient? Yes   LONG TERM GOALS: Target date: 08/07/2022   Pt will be ind with continuing and advancing HEP at home and in community Baseline:  Goal status: IN PROGRESS  2.  Pt will report reduction in pain by at least 50% Baseline:  Goal status: IN PROGRESS  3.  Pt will be able to return to ambulating at least 1 mile for return to normal activities Baseline:  Goal status: IN PROGRESS  4.  Pt will demo at least 4+/5 bilat LE strength Baseline:  Goal status: IN PROGRESS  5.  Pt will have increased FOTO score to at least 71 Baseline: 52 Goal status: IN PROGRESS     PLAN: PT FREQUENCY: 2x/week  PT DURATION: 6 weeks  PLANNED INTERVENTIONS: Therapeutic exercises, Therapeutic activity, Neuromuscular re-education, Balance training, Gait training, Patient/Family education, Self Care, Joint mobilization, Aquatic Therapy, Dry Needling, Electrical stimulation, Cryotherapy, Moist heat, Taping, Ionotophoresis '4mg'$ /ml Dexamethasone, and Manual therapy  PLAN FOR NEXT SESSION: Progress to body weight exercises and lifting as tolerated. Consider exercises on stairs. Continue hip/knee strengthening. Aquatics   Jhordan Mckibben April Ma L Alphonse [place]bridge, PT, DPT 07/24/2022, 8:02 AM

## 2022-07-25 ENCOUNTER — Ambulatory Visit: Payer: BC Managed Care – PPO | Admitting: Bariatrics

## 2022-07-25 ENCOUNTER — Encounter: Payer: Self-pay | Admitting: Bariatrics

## 2022-07-25 VITALS — BP 122/78 | HR 79 | Temp 97.9°F | Ht 66.0 in | Wt 306.0 lb

## 2022-07-25 DIAGNOSIS — R0602 Shortness of breath: Secondary | ICD-10-CM | POA: Diagnosis not present

## 2022-07-25 DIAGNOSIS — Z9884 Bariatric surgery status: Secondary | ICD-10-CM

## 2022-07-25 DIAGNOSIS — Z1331 Encounter for screening for depression: Secondary | ICD-10-CM | POA: Diagnosis not present

## 2022-07-25 DIAGNOSIS — R5383 Other fatigue: Secondary | ICD-10-CM

## 2022-07-25 DIAGNOSIS — E559 Vitamin D deficiency, unspecified: Secondary | ICD-10-CM | POA: Diagnosis not present

## 2022-07-25 DIAGNOSIS — F509 Eating disorder, unspecified: Secondary | ICD-10-CM | POA: Insufficient documentation

## 2022-07-25 DIAGNOSIS — Z6841 Body Mass Index (BMI) 40.0 and over, adult: Secondary | ICD-10-CM

## 2022-07-25 DIAGNOSIS — E669 Obesity, unspecified: Secondary | ICD-10-CM

## 2022-07-25 DIAGNOSIS — R7309 Other abnormal glucose: Secondary | ICD-10-CM

## 2022-07-25 DIAGNOSIS — E611 Iron deficiency: Secondary | ICD-10-CM | POA: Diagnosis not present

## 2022-07-25 DIAGNOSIS — F5089 Other specified eating disorder: Secondary | ICD-10-CM

## 2022-07-25 DIAGNOSIS — Z0000 Encounter for general adult medical examination without abnormal findings: Secondary | ICD-10-CM | POA: Insufficient documentation

## 2022-07-28 ENCOUNTER — Ambulatory Visit: Payer: BC Managed Care – PPO | Admitting: Physical Therapy

## 2022-07-28 ENCOUNTER — Encounter: Payer: Self-pay | Admitting: Physical Therapy

## 2022-07-28 DIAGNOSIS — M170 Bilateral primary osteoarthritis of knee: Secondary | ICD-10-CM | POA: Diagnosis not present

## 2022-07-28 DIAGNOSIS — R2689 Other abnormalities of gait and mobility: Secondary | ICD-10-CM | POA: Diagnosis not present

## 2022-07-28 DIAGNOSIS — M6281 Muscle weakness (generalized): Secondary | ICD-10-CM | POA: Diagnosis not present

## 2022-07-28 DIAGNOSIS — R262 Difficulty in walking, not elsewhere classified: Secondary | ICD-10-CM

## 2022-07-28 LAB — TSH+T4F+T3FREE
Free T4: 1.16 ng/dL (ref 0.82–1.77)
T3, Free: 3.1 pg/mL (ref 2.0–4.4)
TSH: 2.27 u[IU]/mL (ref 0.450–4.500)

## 2022-07-28 LAB — COMPREHENSIVE METABOLIC PANEL
ALT: 35 IU/L — ABNORMAL HIGH (ref 0–32)
AST: 34 IU/L (ref 0–40)
Albumin/Globulin Ratio: 1.6 (ref 1.2–2.2)
Albumin: 4.4 g/dL (ref 3.9–4.9)
Alkaline Phosphatase: 66 IU/L (ref 44–121)
BUN/Creatinine Ratio: 19 (ref 9–23)
BUN: 14 mg/dL (ref 6–24)
Bilirubin Total: 0.4 mg/dL (ref 0.0–1.2)
CO2: 25 mmol/L (ref 20–29)
Calcium: 9.4 mg/dL (ref 8.7–10.2)
Chloride: 100 mmol/L (ref 96–106)
Creatinine, Ser: 0.75 mg/dL (ref 0.57–1.00)
Globulin, Total: 2.7 g/dL (ref 1.5–4.5)
Glucose: 92 mg/dL (ref 70–99)
Potassium: 4.1 mmol/L (ref 3.5–5.2)
Sodium: 138 mmol/L (ref 134–144)
Total Protein: 7.1 g/dL (ref 6.0–8.5)
eGFR: 98 mL/min/{1.73_m2} (ref >59)

## 2022-07-28 LAB — LIPID PANEL WITH LDL/HDL RATIO
Cholesterol, Total: 169 mg/dL (ref 100–199)
HDL: 89 mg/dL (ref >39)
LDL Chol Calc (NIH): 67 mg/dL (ref 0–99)
LDL/HDL Ratio: 0.8 ratio (ref 0.0–3.2)
Triglycerides: 69 mg/dL (ref 0–149)
VLDL Cholesterol Cal: 13 mg/dL (ref 5–40)

## 2022-07-28 LAB — HEMOGLOBIN A1C
Est. average glucose Bld gHb Est-mCnc: 108 mg/dL
Hgb A1c MFr Bld: 5.4 % (ref 4.8–5.6)

## 2022-07-28 LAB — FERRITIN: Ferritin: 259 ng/mL — ABNORMAL HIGH (ref 15–150)

## 2022-07-28 LAB — INSULIN, RANDOM: INSULIN: 3.8 u[IU]/mL (ref 2.6–24.9)

## 2022-07-28 LAB — VITAMIN B1: Thiamine: 99.8 nmol/L (ref 66.5–200.0)

## 2022-07-28 LAB — VITAMIN D 25 HYDROXY (VIT D DEFICIENCY, FRACTURES): Vit D, 25-Hydroxy: 72.3 ng/mL (ref 30.0–100.0)

## 2022-07-28 LAB — VITAMIN B12: Vitamin B-12: 333 pg/mL (ref 232–1245)

## 2022-07-28 NOTE — Therapy (Signed)
OUTPATIENT PHYSICAL THERAPY TREATMENT   Patient Name: Lisa Tran MRN: [id number] DOB:04-21-1976, 46 y.o., female Today's Date: 07/28/2022   PT End of Session - 07/28/22 0801     Visit Number 8    Number of Visits 12    Date for PT Re-Evaluation 08/07/22    Authorization Type BCBS    PT Start Time 0801    PT Stop Time 0840    PT Time Calculation (min) 39 min    Activity Tolerance Patient tolerated treatment well    Behavior During Therapy WFL for tasks assessed/performed              Past Medical History:  Diagnosis Date   Allergy    Anemia    Anxiety    Asthma    Back pain    Food allergy    Osteoarthritis    Swelling of lower extremity    Vitamin D deficiency    Past Surgical History:  Procedure Laterality Date   COSMETIC SURGERY     GASTRIC BYPASS  2003   KNEE SURGERY  2014   Patient Active Problem List   Diagnosis Date Noted   SOB (shortness of breath) on exertion 07/25/2022   Vitamin D deficiency 07/25/2022   Health care maintenance 07/25/2022   Elevated glucose 07/25/2022   Eating disorder 07/25/2022   Other fatigue 07/12/2022   Class 3 severe obesity with serious comorbidity and body mass index (BMI) of 45.0 to 49.9 in adult ([place]) 07/12/2022   Primary osteoarthritis of both knees 06/21/2022   Family history of colon cancer 09/23/2020   Fibroids 10/15/2017   Thrombophlebitis of superficial veins of left lower extremity 07/21/2017   Cervical paraspinal muscle spasm 10/05/2015   Iron deficiency 03/16/2015   Other specified postprocedural states 02/25/2015   Morbid obesity ([place]) 02/25/2015   H/O gastric bypass 02/25/2015    PCP: Samuel Bouche, NP  REFERRING PROVIDER: Rosemarie Ax, MD   REFERRING DIAG: M17.0 (ICD-10-CM) - Primary osteoarthritis of both knees   THERAPY DIAG:  Primary osteoarthritis of both knees  Difficulty in walking, not elsewhere classified  Muscle weakness (generalized)  Other abnormalities of gait and  mobility  Rationale for Evaluation and Treatment Rehabilitation  ONSET DATE: Labor Day weekend  SUBJECTIVE:   SUBJECTIVE STATEMENT: Pt reports L knee flared up. Pt states she didn't do anything strenuous but felt it when she laid down. Pt states she had no problems with the side step. Pt reports that the ktape did help her knee pain.   PERTINENT HISTORY: Has a history of knee surgery on the right side.   PAIN:  Are you having pain? Yes: NPRS scale: 1/10 Pain location: bilat knees Pain description: "tender" Aggravating factors: Increased weight bearing; after sitting too long and first standing up Relieving factors: No weight  PRECAUTIONS: None  WEIGHT BEARING RESTRICTIONS No  FALLS:  Has patient fallen in last 6 months? No  LIVING ENVIRONMENT: Lives with: lives with their family and lives with their spouse Lives in: House/apartment Stairs: Yes: Internal: 15 steps; on right going up Has following equipment at home: None  OCCUPATION: Desk work  PLOF: Independent  PATIENT GOALS Return back to walking   OBJECTIVE:   FROM EVAL  DIAGNOSTIC FINDINGS: X-rays indicate knee OA  PATIENT SURVEYS:  FOTO 52; predicted 26  COGNITION:  Overall cognitive status: Within functional limits for tasks assessed     SENSATION: WFL  EDEMA:  None  MUSCLE LENGTH: Hamstrings: Right 80 deg; Left 45  deg  POSTURE: No Significant postural limitations  PALPATION: TTP medial L knee > R knee  LOWER EXTREMITY ROM:  Active ROM Right eval Left eval  Hip flexion    Hip extension    Hip abduction    Hip adduction    Hip internal rotation    Hip external rotation    Knee flexion 105 105  Knee extension 0 0  Ankle dorsiflexion    Ankle plantarflexion    Ankle inversion    Ankle eversion     (Blank rows = not tested)  LOWER EXTREMITY MMT:  MMT Right eval Left eval  Hip flexion 4+ 4+  Hip extension 4- 4-  Hip abduction 3+ 3  Hip adduction 3 3  Hip internal rotation     Hip external rotation    Knee flexion 4+ 4+  Knee extension 5 4+  Ankle dorsiflexion    Ankle plantarflexion    Ankle inversion    Ankle eversion     (Blank rows = not tested)  LOWER EXTREMITY SPECIAL TESTS:  Hip special tests: Saralyn Pilar (FABER) test: negative, Thomas test: negative, and Craig's test: negative  FUNCTIONAL TESTS:  N/a    TODAY'S TREATMENT: 07/28/22: THEREX   Nustep L6 x 5 min UEs/LEs      Sitting   Hamstring stretch 2x30 sec   Adductor stretch 2x30 sec    Standing   Quad stretch 2x30 sec   Forward step up 4" step x10 R&L   Side step up 4" step x10 R&L   Heel raise 2x10   Hamstring curl 2x10 green TB   Knee extended hip flexion 2x10 green TB  MANUAL THERAPY IASTM massage roller adductors and quads   K tape (2 "I" strips) bilat knees for quad stability and pain relief     07/24/22: THEREX   Nustep L6 x 5 min UEs/LEs    Sitting   Hamstring stretch 2x30 sec   Figure 4 stretch 2x30 sec    Standing   Gastroc stretch 2x30 sec   Hip flexor stretch 2x30 sec   Quad stretch 2x30 sec  SELF CARE   Self massage with massage stick   Self massage and myofacial release with foam roll   K-tape self application for bilat knees  MANUAL THERAPY   K-tape bilat knees for stabilizing quads and pain relief      07/21/22: THEREX   Nustep L6 x 5 min UEs/LEs      Standing   Hip flexor stretch 2x30 sec bilat   Hamstring stretch 2x30 sec   Quad stretch 2x30 sec   Heel/toe 2x10   Side step 3x15'   Forwards/backwards monster walk 3x15'    Sitting   Figure 4 stretch x30 sec bilat    Supine   SLR 2x10 red TB   SAQ 2x10x3 sec red TB    Prone   Hamstring curl 2x10 red TB       PATIENT EDUCATION:  Education details: Exam findings, POC, and initial HEP Person educated: Patient Education method: Explanation, Demonstration, and Handouts Education comprehension: verbalized understanding, returned demonstration, and needs further education   HOME  EXERCISE PROGRAM: Access Code: [email or web address] URL: [email or web address] Date: 07/14/2022 Prepared by: Estill Bamberg April Thurnell Garbe  Exercises - Supine Quadriceps Stretch with Strap on Table  - 1 x daily - 7 x weekly - 2 sets - 30 sec hold - Seated Hamstring Stretch  - 1 x daily - 7 x weekly - 2 sets - 30 sec hold -  Hooklying Hamstring Stretch with Strap  - 1 x daily - 7 x weekly - 2 sets - 30 sec hold - Seated Figure 4 Piriformis Stretch  - 1 x daily - 7 x weekly - 2 sets - 30 sec hold - Straight Leg Raise with External Rotation  - 1 x daily - 7 x weekly - 2 sets - 10 reps - Clamshell with Resistance  - 1 x daily - 7 x weekly - 2 sets - 10 reps - Supine Bridge with Mini Swiss Ball Between Knees  - 1 x daily - 7 x weekly - 3 sets - 10 reps - Hip Abduction with Resistance Loop  - 1 x daily - 7 x weekly - 2 sets - 10 reps - Hip Extension with Resistance Loop  - 1 x daily - 7 x weekly - 2 sets - 10 reps - Standing Hamstring Curl with Resistance  - 1 x daily - 7 x weekly - 2 sets - 10 reps - Seated Knee Extension with Resistance  - 1 x daily - 7 x weekly - 2 sets - 10 reps  ASSESSMENT:  CLINICAL IMPRESSION: Initiated strengthening on stairs this session. Trying to progress pt to more body weight exercises. Pt will assess if initiating stair exercises aggravate her knees too much.    OBJECTIVE IMPAIRMENTS decreased activity tolerance, decreased endurance, decreased mobility, difficulty walking, decreased strength, increased fascial restrictions, improper body mechanics, postural dysfunction, obesity, and pain.   ACTIVITY LIMITATIONS standing, transfers, and locomotion level  PARTICIPATION LIMITATIONS: shopping, community activity, and yard work  PERSONAL FACTORS Fitness and Past/current experiences are also affecting patient's functional outcome.   REHAB POTENTIAL: Good  CLINICAL DECISION MAKING: Stable/uncomplicated  EVALUATION COMPLEXITY: Low   GOALS: Goals reviewed with  patient? Yes   LONG TERM GOALS: Target date: 08/07/2022   Pt will be ind with continuing and advancing HEP at home and in community Baseline:  Goal status: IN PROGRESS  2.  Pt will report reduction in pain by at least 50% Baseline:  Goal status: IN PROGRESS  3.  Pt will be able to return to ambulating at least 1 mile for return to normal activities Baseline:  Goal status: IN PROGRESS  4.  Pt will demo at least 4+/5 bilat LE strength Baseline:  Goal status: IN PROGRESS  5.  Pt will have increased FOTO score to at least 71 Baseline: 52 Goal status: IN PROGRESS     PLAN: PT FREQUENCY: 2x/week  PT DURATION: 6 weeks  PLANNED INTERVENTIONS: Therapeutic exercises, Therapeutic activity, Neuromuscular re-education, Balance training, Gait training, Patient/Family education, Self Care, Joint mobilization, Aquatic Therapy, Dry Needling, Electrical stimulation, Cryotherapy, Moist heat, Taping, Ionotophoresis '4mg'$ /ml Dexamethasone, and Manual therapy  PLAN FOR NEXT SESSION: Progress to body weight exercises and lifting as tolerated. Consider exercises on stairs. Continue hip/knee strengthening. Aquatics   Peytyn Trine April Ma L Alakai Macbride, PT, DPT 07/28/2022, 8:01 AM

## 2022-07-29 ENCOUNTER — Encounter: Payer: Self-pay | Admitting: Bariatrics

## 2022-07-31 ENCOUNTER — Ambulatory Visit (INDEPENDENT_AMBULATORY_CARE_PROVIDER_SITE_OTHER): Payer: BC Managed Care – PPO | Admitting: Family Medicine

## 2022-07-31 ENCOUNTER — Ambulatory Visit: Payer: BC Managed Care – PPO | Admitting: Physical Therapy

## 2022-07-31 ENCOUNTER — Encounter: Payer: Self-pay | Admitting: Family Medicine

## 2022-07-31 ENCOUNTER — Encounter: Payer: Self-pay | Admitting: Physical Therapy

## 2022-07-31 DIAGNOSIS — R262 Difficulty in walking, not elsewhere classified: Secondary | ICD-10-CM

## 2022-07-31 DIAGNOSIS — M170 Bilateral primary osteoarthritis of knee: Secondary | ICD-10-CM | POA: Diagnosis not present

## 2022-07-31 DIAGNOSIS — M6281 Muscle weakness (generalized): Secondary | ICD-10-CM | POA: Diagnosis not present

## 2022-07-31 DIAGNOSIS — R2689 Other abnormalities of gait and mobility: Secondary | ICD-10-CM

## 2022-07-31 MED ORDER — DICLOFENAC SODIUM 2 % EX SOLN: 2.0000 | Freq: Two times a day (BID) | CUTANEOUS | 11 refills | Status: DC

## 2022-07-31 NOTE — Patient Instructions (Signed)
Aquatic Therapy at [facility]-  What to Expect!  Where:   [facility] [address] [place], [place] [address] Rehab phone [phone]  NOTE:  You will receive an automated phone message reminding you of your appt and it will say the appointment is at the [address] [facility] [address] [facility] clinic.          How to Prepare: Please make sure you drink 8 ounces of water about one hour prior to your pool session A caregiver may attend if needed with the patient to help assist as needed. A caregiver can sit in the pool room on chair. Please arrive IN YOUR SUIT and 15 minutes prior to your appointment - this helps to avoid delays in starting your session. Please make sure to attend to any toileting needs prior to entering the pool Locker rooms for changing are provided.   There is direct access to the pool deck form the locker room.  You can lock your belongings in a locker with lock provided. Once on the pool deck your therapist will ask if you have signed the Patient  Consent and Assignment of Benefits form before beginning treatment Your therapist may take your blood pressure prior to, during and after your session if indicated We usually try and create a home exercise program based on activities we do in the pool.  Please be thinking about who might be able to assist you in the pool should you need to participate in an aquatic home exercise program at the time of discharge if you need assistance.  Some patients do not want to or do not have the ability to participate in an aquatic home program - this is not a barrier in any way to you participating in aquatic therapy as part of your current therapy plan! After Discharge from PT, you can continue using home program at  the [facility]/, there is a drop-in fee for $5 ($45 a month)or for 60 years  or older $4.00 ($40 a month for seniors ) or any local [employer] pool.  Memberships for purchase are  available for gym/pool at [facility]  IT IS VERY IMPORTANT THAT YOUR LAST VISIT BE IN THE CLINIC AT [address] AFTER YOUR LAST AQUATIC VISIT.  PLEASE MAKE SURE THAT YOU HAVE A [address]  APPOINTMENT SCHEDULED.   About the pool: Pool is located approximately 500 FT from the entrance of the building.  Please bring a support person if you need assistance traveling this      distance.   Your therapist will assist you in entering the water; there are two ways to           enter: stairs with railings, and a mechanical lift. Your therapist will determine the most appropriate way for you.  Water temperature is usually between 88-90 degrees  There may be up to 2 other swimmers in the pool at the same time  The pool deck is tile, please wear shoes with good traction if you prefer not to be barefoot.    Contact Info:  For appointment scheduling and cancellations:         Please call the [facility]  PH:[phone]              Aquatic Therapy  Outpatient [facility]       All sessions are 45 minutes                                                    

## 2022-07-31 NOTE — Assessment & Plan Note (Signed)
Acute on chronic in nature.  She has noticed some improvement with her strength and function.  Notices some pain at night. -Counseled on home exercise therapy and supportive care. - pennsaid  - could consider further imaging, custom orthotics or injection.

## 2022-07-31 NOTE — Progress Notes (Signed)
  Lisa Tran - 46 y.o. female MRN [id number]  Date of birth: [birth year]  SUBJECTIVE:  Including CC & ROS.  No chief complaint on file.   Lisa Tran is a 46 y.o. female that is following up for her bilateral knee pain.  Physical therapy has been helping.  Continues to have a mild pain at night.   Review of Systems See HPI   HISTORY: Past Medical, Surgical, Social, and Family History Reviewed & Updated per EMR.   Pertinent Historical Findings include:  Past Medical History:  Diagnosis Date   Allergy    Anemia    Anxiety    Asthma    Back pain    Food allergy    Osteoarthritis    Swelling of lower extremity    Vitamin D deficiency     Past Surgical History:  Procedure Laterality Date   COSMETIC SURGERY     GASTRIC BYPASS  2003   KNEE SURGERY  2014     PHYSICAL EXAM:  VS: BP 120/72 (BP Location: Left Arm, Patient Position: Sitting)   Ht '5\' 6"'$  (1.676 m)   Wt (!) 306 lb (138.8 kg)   BMI 49.39 kg/m  Physical Exam Gen: NAD, alert, cooperative with exam, well-appearing MSK:  Neurovascularly intact       ASSESSMENT & PLAN:   Primary osteoarthritis of both knees Acute on chronic in nature.  She has noticed some improvement with her strength and function.  Notices some pain at night. -Counseled on home exercise therapy and supportive care. - pennsaid  - could consider further imaging, custom orthotics or injection.

## 2022-07-31 NOTE — Telephone Encounter (Signed)
Can you please advise?

## 2022-07-31 NOTE — Therapy (Signed)
OUTPATIENT PHYSICAL THERAPY TREATMENT   Patient Name: Lisa Tran MRN: [id number] DOB:12-Feb-1976, 46 y.o., female Today's Date: 07/31/2022   PT End of Session - 07/31/22 0807     Visit Number 9    Number of Visits 12    Date for PT Re-Evaluation 08/07/22    Authorization Type BCBS    PT Start Time 0807    PT Stop Time 0845    PT Time Calculation (min) 38 min    Activity Tolerance Patient tolerated treatment well    Behavior During Therapy [facility] for tasks assessed/performed              Past Medical History:  Diagnosis Date   Allergy    Anemia    Anxiety    Asthma    Back pain    Food allergy    Osteoarthritis    Swelling of lower extremity    Vitamin D deficiency    Past Surgical History:  Procedure Laterality Date   COSMETIC SURGERY     GASTRIC BYPASS  2003   KNEE SURGERY  2014   Patient Active Problem List   Diagnosis Date Noted   SOB (shortness of breath) on exertion 07/25/2022   Vitamin D deficiency 07/25/2022   Health care maintenance 07/25/2022   Elevated glucose 07/25/2022   Eating disorder 07/25/2022   Other fatigue 07/12/2022   Class 3 severe obesity with serious comorbidity and body mass index (BMI) of 45.0 to 49.9 in adult ([place]) 07/12/2022   Primary osteoarthritis of both knees 06/21/2022   Family history of colon cancer 09/23/2020   Fibroids 10/15/2017   Thrombophlebitis of superficial veins of left lower extremity 07/21/2017   Cervical paraspinal muscle spasm 10/05/2015   Iron deficiency 03/16/2015   Other specified postprocedural states 02/25/2015   Morbid obesity ([place]) 02/25/2015   H/O gastric bypass 02/25/2015    PCP: Samuel Bouche, NP  REFERRING PROVIDER: Rosemarie Ax, MD   REFERRING DIAG: M17.0 (ICD-10-CM) - Primary osteoarthritis of both knees   THERAPY DIAG:  Primary osteoarthritis of both knees  Difficulty in walking, not elsewhere classified  Muscle weakness (generalized)  Other abnormalities of gait and  mobility  Rationale for Evaluation and Treatment Rehabilitation  ONSET DATE: Labor Day weekend  SUBJECTIVE:   SUBJECTIVE STATEMENT: Pt states L knee feels a little funny today. R knee feels fine. States she was doing some deep cleaning and squatting on the floor.   PERTINENT HISTORY: Has a history of knee surgery on the right side.   PAIN:  Are you having pain? Yes: NPRS scale: 1/10 Pain location: bilat knees Pain description: "tender" Aggravating factors: Increased weight bearing; after sitting too long and first standing up Relieving factors: No weight  PRECAUTIONS: None  WEIGHT BEARING RESTRICTIONS No  FALLS:  Has patient fallen in last 6 months? No  LIVING ENVIRONMENT: Lives with: lives with their family and lives with their spouse Lives in: House/apartment Stairs: Yes: Internal: 15 steps; on right going up Has following equipment at home: None  OCCUPATION: Desk work  PLOF: Independent  PATIENT GOALS Return back to walking   OBJECTIVE:   FROM EVAL  PATIENT SURVEYS:  FOTO 52; predicted 71  MUSCLE LENGTH: Hamstrings: Right 80 deg; Left 45 deg  PALPATION: TTP medial L knee > R knee  LOWER EXTREMITY ROM:  Active ROM Right eval Left eval  Hip flexion    Hip extension    Hip abduction    Hip adduction    Hip internal  rotation    Hip external rotation    Knee flexion 105 105  Knee extension 0 0  Ankle dorsiflexion    Ankle plantarflexion    Ankle inversion    Ankle eversion     (Blank rows = not tested)  LOWER EXTREMITY MMT:  MMT Right eval Left eval  Hip flexion 4+ 4+  Hip extension 4- 4-  Hip abduction 3+ 3  Hip adduction 3 3  Hip internal rotation    Hip external rotation    Knee flexion 4+ 4+  Knee extension 5 4+  Ankle dorsiflexion    Ankle plantarflexion    Ankle inversion    Ankle eversion     (Blank rows = not tested)  FUNCTIONAL TESTS:  N/a    TODAY'S TREATMENT: 07/31/22:  THEREX   Nustep L6 x 5 min  UEs/LEs      Sitting   Hamstring x30 sec    Standing   Quad stretch x30 sec   6" runner's step up 2x10   6" lateral step up 2x10   4" step down 2x5      Supine   Hamstring curl + bridge green pball x10   Bridge green pball x10   Hamstring stretch with strap x30 sec    SELF CARE   K tape (2 "I" strips) bilat knees for quad stability and pain relief   07/28/22: THEREX   Nustep L6 x 5 min UEs/LEs      Sitting   Hamstring stretch 2x30 sec   Adductor stretch 2x30 sec    Standing   Quad stretch 2x30 sec   Forward step up 4" step x10 R&L   Side step up 4" step x10 R&L   Heel raise 2x10   Hamstring curl 2x10 green TB   Knee extended hip flexion 2x10 green TB  MANUAL THERAPY IASTM massage roller adductors and quads   K tape (2 "I" strips) bilat knees for quad stability and pain relief     07/24/22: THEREX   Nustep L6 x 5 min UEs/LEs    Sitting   Hamstring stretch 2x30 sec   Figure 4 stretch 2x30 sec    Standing   Gastroc stretch 2x30 sec   Hip flexor stretch 2x30 sec   Quad stretch 2x30 sec  SELF CARE   Self massage with massage stick   Self massage and myofacial release with foam roll   K-tape self application for bilat knees  MANUAL THERAPY   K-tape bilat knees for stabilizing quads and pain relief       PATIENT EDUCATION:  Education details: Exam findings, POC, and initial HEP Person educated: Patient Education method: Explanation, Demonstration, and Handouts Education comprehension: verbalized understanding, returned demonstration, and needs further education   HOME EXERCISE PROGRAM: Access Code: [email or web address] URL: [email or web address] Date: 07/14/2022 Prepared by: Estill Bamberg April Thurnell Garbe  Exercises - Supine Quadriceps Stretch with Strap on Table  - 1 x daily - 7 x weekly - 2 sets - 30 sec hold - Seated Hamstring Stretch  - 1 x daily - 7 x weekly - 2 sets - 30 sec hold - Hooklying Hamstring Stretch with Strap  - 1 x daily - 7 x  weekly - 2 sets - 30 sec hold - Seated Figure 4 Piriformis Stretch  - 1 x daily - 7 x weekly - 2 sets - 30 sec hold - Straight Leg Raise with External Rotation  - 1 x daily - 7 x weekly - 2 sets -  10 reps - Clamshell with Resistance  - 1 x daily - 7 x weekly - 2 sets - 10 reps - Supine Bridge with Mini Swiss Ball Between Knees  - 1 x daily - 7 x weekly - 3 sets - 10 reps - Hip Abduction with Resistance Loop  - 1 x daily - 7 x weekly - 2 sets - 10 reps - Hip Extension with Resistance Loop  - 1 x daily - 7 x weekly - 2 sets - 10 reps - Standing Hamstring Curl with Resistance  - 1 x daily - 7 x weekly - 2 sets - 10 reps - Seated Knee Extension with Resistance  - 1 x daily - 7 x weekly - 2 sets - 10 reps  ASSESSMENT:  CLINICAL IMPRESSION: Pt reports getting a pball at home -- demonstrated exercise she could do with the pball. Pt tolerated stair exercises well --    OBJECTIVE IMPAIRMENTS decreased activity tolerance, decreased endurance, decreased mobility, difficulty walking, decreased strength, increased fascial restrictions, improper body mechanics, postural dysfunction, obesity, and pain.   ACTIVITY LIMITATIONS standing, transfers, and locomotion level  PARTICIPATION LIMITATIONS: shopping, community activity, and yard work  PERSONAL FACTORS Fitness and Past/current experiences are also affecting patient's functional outcome.   REHAB POTENTIAL: Good  CLINICAL DECISION MAKING: Stable/uncomplicated  EVALUATION COMPLEXITY: Low   GOALS: Goals reviewed with patient? Yes   LONG TERM GOALS: Target date: 08/07/2022   Pt will be ind with continuing and advancing HEP at home and in community Baseline:  Goal status: IN PROGRESS  2.  Pt will report reduction in pain by at least 50% Baseline:  Goal status: IN PROGRESS  3.  Pt will be able to return to ambulating at least 1 mile for return to normal activities Baseline:  Goal status: IN PROGRESS  4.  Pt will demo at least 4+/5 bilat  LE strength Baseline:  Goal status: IN PROGRESS  5.  Pt will have increased FOTO score to at least 71 Baseline: 52 Goal status: IN PROGRESS     PLAN: PT FREQUENCY: 2x/week  PT DURATION: 6 weeks  PLANNED INTERVENTIONS: Therapeutic exercises, Therapeutic activity, Neuromuscular re-education, Balance training, Gait training, Patient/Family education, Self Care, Joint mobilization, Aquatic Therapy, Dry Needling, Electrical stimulation, Cryotherapy, Moist heat, Taping, Ionotophoresis '4mg'$ /ml Dexamethasone, and Manual therapy  PLAN FOR NEXT SESSION: Progress to body weight exercises and lifting as tolerated. Consider exercises on stairs. Continue hip/knee strengthening. Aquatics   Hakeem Frazzini April Ma L Hannah Strader, PT, DPT 07/31/2022, 8:07 AM

## 2022-07-31 NOTE — Patient Instructions (Signed)
Good to see you I have sent in the medicine and it should be mailed to you  Please use ice as needed   Please send me a message in MyChart with any questions or updates.  Please see me back in 6-8 weeks.   --Dr. Raeford Razor

## 2022-08-01 NOTE — Progress Notes (Unsigned)
Chief Complaint:   OBESITY Lisa Tran (MR# [id number]) is a 46 y.o. female who presents for evaluation and treatment of obesity and related comorbidities. Current BMI is Body mass index is 49.39 kg/m. Lisa Tran has been struggling with her weight for many years and has been unsuccessful in either losing weight, maintaining weight loss, or reaching her healthy weight goal.  Lisa Tran was seen for an information visit on 07/12/2022. She states that she has food allergies to all seafood except tuna. She does not like to cook, secondary to time and energy.   Lisa Tran is currently in the action stage of change and ready to dedicate time achieving and maintaining a healthier weight. Lisa Tran is interested in becoming our patient and working on intensive lifestyle modifications including (but not limited to) diet and exercise for weight loss.  Lisa Tran's habits were reviewed today and are as follows: she thinks her family will eat healthier with her, her desired weight loss is 106 lbs, she has been heavy most of her life, her heaviest weight ever was 408 pounds, she has significant food cravings issues, she snacks frequently in the evenings, she skips meals frequently, she is frequently drinking liquids with calories, she frequently makes poor food choices, she frequently eats larger portions than normal, and she struggles with emotional eating.  Depression Screen Lisa Tran's Food and Mood (modified PHQ-9) score was 13.     07/25/2022    7:49 AM  Depression screen PHQ 2/9  Decreased Interest 2  Down, Depressed, Hopeless 2  PHQ - 2 Score 4  Altered sleeping 1  Tired, decreased energy 2  Change in appetite 2  Feeling bad or failure about yourself  2  Trouble concentrating 2  Moving slowly or fidgety/restless 0  Suicidal thoughts 0  PHQ-9 Score 13  Difficult doing work/chores Somewhat difficult   Subjective:   1. Other fatigue Lisa Tran admits to daytime somnolence and admits to waking up  still tired. Patient has a history of symptoms of daytime fatigue and morning fatigue. Lisa Tran generally gets 6 or 7 hours of sleep per night, and states that she has generally restful sleep. Snoring is present. Apneic episodes are not present. Epworth Sleepiness Score is 7.   2. SOB (shortness of breath) on exertion Lisa Tran notes increasing shortness of breath with exercising and seems to be worsening over time with weight gain. She notes getting out of breath sooner with activity than she used to. This has not gotten worse recently. Lisa Tran denies shortness of breath at rest or orthopnea.  3. Vitamin D deficiency Lisa Tran is taking Vitamin D supplementation.   4. Iron deficiency Lisa Tran has a history of uterine fibroids.   5. Health care maintenance Given history of obesity.   6. 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 Her highest weight was 240 lbs and her lowest weight was 201 lbs. She has no restriction per the patient.   7. Elevated glucose Lisa Tran is not on medications currently.   8. Other disorder of eating Lisa Tran notes she eats when she is stressed.   Assessment/Plan:   1. Other fatigue Lisa Tran does feel that her weight is causing her energy to be lower than it should be. Fatigue may be related to obesity, depression or many other causes. Labs will be ordered, and in the meanwhile, Lisa Tran will focus on self care including making healthy food choices, increasing physical activity and focusing on stress reduction.  - TSH+T4F+T3Free - Insulin, random  2. SOB (  shortness of breath) on exertion Lisa Tran does feel that she gets out of breath more easily that she used to when she exercises. Lisa Tran's shortness of breath appears to be obesity related and exercise induced. She has agreed to work on weight loss and gradually increase exercise to treat her exercise induced shortness of breath. Will continue to monitor closely.  - TSH+T4F+T3Free  3. Vitamin D  deficiency We will check labs today. Lisa Tran will continue Vitamin D supplementation, and we will follow-up at her next visit.   - VITAMIN D 25 Hydroxy (Vit-D Deficiency, Fractures)  4. Iron deficiency We will check labs today, and we will follow-up at Lisa Tran's next visit.   - Vitamin B12 - Ferritin  5. Health care maintenance We will check labs today. IC and EKG were done today and reviewed with the patient.   6. H/O gastric bypass We will check labs today. Lisa Tran is to eat small frequent meals and keep her protein intake high.   - Comprehensive metabolic panel - Ferritin - Lipid Panel With LDL/HDL Ratio - Vitamin B1  7. Elevated glucose We will check labs today, and we will follow-up at Lisa Tran's next visit.   - Insulin, random - Hemoglobin A1c  8. Other disorder of eating We will refer Lisa Tran to Dr. Mallie Mussel, our Bariatric Psychologist for evaluation. Handout on emotional eating versus physical hunger was given.   9. Depression screening Lisa Tran had a positive depression screening. Depression is commonly associated with obesity and often results in emotional eating behaviors. We will monitor this closely and work on CBT to help improve the non-hunger eating patterns. Referral to Psychology may be required if no improvement is seen as she continues in our clinic.  10. Obesity, Current BMI 49.4 Lisa Tran is currently in the action stage of change and her goal is to continue with weight loss efforts. I recommend Lisa Tran begin the structured treatment plan as follows:  She has agreed to the Category 3 Plan.  Meal planning was discussed. She will not skip meals.   Exercise goals: No exercise has been prescribed at this time.   Behavioral modification strategies: increasing lean protein intake, decreasing simple carbohydrates, increasing vegetables, increasing water intake, decreasing eating out, no skipping meals, meal planning and cooking strategies, keeping healthy foods in  the home, and planning for success.  She was informed of the importance of frequent follow-up visits to maximize her success with intensive lifestyle modifications for her multiple health conditions. She was informed we would discuss her lab results at her next visit unless there is a critical issue that needs to be addressed sooner. Lisa Tran agreed to keep her next visit at the agreed upon time to discuss these results.  Objective:   Blood pressure 122/78, pulse 79, temperature 97.9 F (36.6 C), height '5\' 6"'$  (1.676 m), weight (!) 306 lb (138.8 kg), SpO2 98 %. Body mass index is 49.39 kg/m.  EKG: Normal sinus rhythm, rate 77 BPM.  Indirect Calorimeter completed today shows a VO2 of 279 and a REE of 1930.  Her calculated basal metabolic rate is 3532 thus her basal metabolic rate is worse than expected.  General: Cooperative, alert, well developed, in no acute distress. HEENT: Conjunctivae and lids unremarkable. Cardiovascular: Regular rhythm.  Lungs: Normal work of breathing. Neurologic: No focal deficits.   Lab Results  Component Value Date   CREATININE 0.75 07/25/2022   BUN 14 07/25/2022   NA 138 07/25/2022   K 4.1 07/25/2022   CL 100 07/25/2022  CO2 25 07/25/2022   Lab Results  Component Value Date   ALT 35 (H) 07/25/2022   AST 34 07/25/2022   ALKPHOS 66 07/25/2022   BILITOT 0.4 07/25/2022   Lab Results  Component Value Date   HGBA1C 5.4 07/25/2022   HGBA1C 5.4 02/09/2022   Lab Results  Component Value Date   INSULIN 3.8 07/25/2022   Lab Results  Component Value Date   TSH 2.270 07/25/2022   Lab Results  Component Value Date   CHOL 169 07/25/2022   HDL 89 07/25/2022   LDLCALC 67 07/25/2022   TRIG 69 07/25/2022   CHOLHDL 2.0 02/09/2022   Lab Results  Component Value Date   WBC 5.1 02/09/2022   HGB 13.9 02/09/2022   HCT 41.1 02/09/2022   MCV 89.9 02/09/2022   PLT 352 02/09/2022   Lab Results  Component Value Date   IRON 115 02/09/2022   TIBC 334  02/09/2022   FERRITIN 259 (H) 07/25/2022   Attestation Statements:   Reviewed by clinician on day of visit: allergies, medications, problem list, medical history, surgical history, family history, social history, and previous encounter notes.   Wilhemena Durie, am acting as Location manager for [employer], DO.  I have reviewed the above documentation for accuracy and completeness, and I agree with the above. Jearld Lesch, DO

## 2022-08-02 ENCOUNTER — Telehealth: Payer: Self-pay | Admitting: *Deleted

## 2022-08-02 NOTE — Telephone Encounter (Signed)
Left message for patient to call back regarding [facility]aid rx Dr. Raeford Razor sent to [facility] on 07/31/22. I want to know if anyone has contacted her to arrange shipment yet.

## 2022-08-04 ENCOUNTER — Encounter: Payer: Self-pay | Admitting: Physical Therapy

## 2022-08-04 ENCOUNTER — Ambulatory Visit: Payer: BC Managed Care – PPO | Admitting: Physical Therapy

## 2022-08-04 DIAGNOSIS — M6281 Muscle weakness (generalized): Secondary | ICD-10-CM

## 2022-08-04 DIAGNOSIS — R2689 Other abnormalities of gait and mobility: Secondary | ICD-10-CM

## 2022-08-04 DIAGNOSIS — R262 Difficulty in walking, not elsewhere classified: Secondary | ICD-10-CM | POA: Diagnosis not present

## 2022-08-04 DIAGNOSIS — M170 Bilateral primary osteoarthritis of knee: Secondary | ICD-10-CM

## 2022-08-04 NOTE — Therapy (Addendum)
OUTPATIENT PHYSICAL THERAPY TREATMENT AND RE-CERT   Patient Name: Lisa Tran MRN: [id number] DOB:1975-11-01, 46 y.o., female Today's Date: 08/04/2022   PT End of Session - 08/04/22 0802     Visit Number 10    Number of Visits 24    Date for PT Re-Evaluation 09/15/22    Authorization Type BCBS    PT Start Time 0803    PT Stop Time 0845    PT Time Calculation (min) 42 min    Activity Tolerance Patient tolerated treatment well    Behavior During Therapy [facility] for tasks assessed/performed              Past Medical History:  Diagnosis Date   Allergy    Anemia    Anxiety    Asthma    Back pain    Food allergy    Osteoarthritis    Swelling of lower extremity    Vitamin D deficiency    Past Surgical History:  Procedure Laterality Date   COSMETIC SURGERY     GASTRIC BYPASS  2003   KNEE SURGERY  2014   Patient Active Problem List   Diagnosis Date Noted   SOB (shortness of breath) on exertion 07/25/2022   Vitamin D deficiency 07/25/2022   Health care maintenance 07/25/2022   Elevated glucose 07/25/2022   Eating disorder 07/25/2022   Other fatigue 07/12/2022   Class 3 severe obesity with serious comorbidity and body mass index (BMI) of 45.0 to 49.9 in adult ([place]) 07/12/2022   Primary osteoarthritis of both knees 06/21/2022   Family history of colon cancer 09/23/2020   Fibroids 10/15/2017   Thrombophlebitis of superficial veins of left lower extremity 07/21/2017   Cervical paraspinal muscle spasm 10/05/2015   Iron deficiency 03/16/2015   Other specified postprocedural states 02/25/2015   Morbid obesity ([place]) 02/25/2015   H/O gastric bypass 02/25/2015    PCP: Samuel Bouche, NP  REFERRING PROVIDER: Rosemarie Ax, MD   REFERRING DIAG: M17.0 (ICD-10-CM) - Primary osteoarthritis of both knees   THERAPY DIAG:  Primary osteoarthritis of both knees  Difficulty in walking, not elsewhere classified  Muscle weakness (generalized)  Other abnormalities of  gait and mobility  Rationale for Evaluation and Treatment Rehabilitation  ONSET DATE: Labor Day weekend  SUBJECTIVE:   SUBJECTIVE STATEMENT: Pt states right side is feeling pretty good. Pt states she felt it a lot on Tuesday in her L knee. Last night, she couldn't get comfortable and says that L knee is flared this morning. Pt saw Dr. Raeford Razor on Monday and gave her voltaren gel for pain relief.  Pt does note she is able to now walk ~1/2 mile before her knees get agitated whereas prior any walking would bother her knees.   PERTINENT HISTORY: Has a history of knee surgery on the right side.   PAIN:  Are you having pain? Yes: NPRS scale: 1/10 Pain location: L knee Pain description: "tender" Aggravating factors: Increased weight bearing; after sitting too long and first standing up Relieving factors: No weight  PRECAUTIONS: None  WEIGHT BEARING RESTRICTIONS No  FALLS:  Has patient fallen in last 6 months? No  LIVING ENVIRONMENT: Lives with: lives with their family and lives with their spouse Lives in: House/apartment Stairs: Yes: Internal: 15 steps; on right going up Has following equipment at home: None  OCCUPATION: Desk work  PLOF: Independent  PATIENT GOALS Return back to walking   OBJECTIVE:   FROM EVAL  PATIENT SURVEYS:  FOTO 52; predicted 75  MUSCLE LENGTH: Hamstrings: Right 80 deg; Left 45 deg  PALPATION: TTP medial L knee > R knee  LOWER EXTREMITY ROM:  Active ROM Right eval Left eval  Hip flexion    Hip extension    Hip abduction    Hip adduction    Hip internal rotation    Hip external rotation    Knee flexion 105 105  Knee extension 0 0  Ankle dorsiflexion    Ankle plantarflexion    Ankle inversion    Ankle eversion     (Blank rows = not tested)  LOWER EXTREMITY MMT:  MMT Right eval Left eval Right 10/27 Left 10/27  Hip flexion 4+ 4+ 5 4+  Hip extension 4- 4- 5 4  Hip abduction 3+ 3 5 4+  Hip adduction 3 3 4+ 4  Hip internal  rotation      Hip external rotation      Knee flexion 4+ 4+ 5 5  Knee extension 5 4+ 5 4+  Ankle dorsiflexion      Ankle plantarflexion      Ankle inversion      Ankle eversion       (Blank rows = not tested)  FUNCTIONAL TESTS:  N/a    TODAY'S TREATMENT: 08/04/22: THEREX   Recumbent bike L6 x 5 min warm up   Leg press 14 plates, seat 6 DL 2x10, 8 plates SL 2x8    Sitting   Hamstring 2x 30 sec      Standing   Quad stretch 2x 30 sec    Supine   SLR 2# 2x10 R&L  VASO   X10 min, L knee, low pressure, 34 deg temp  07/31/22:  THEREX   Nustep L6 x 5 min UEs/LEs      Sitting   Hamstring x30 sec    Standing   Quad stretch x30 sec   6" runner's step up 2x10   6" lateral step up 2x10   4" step down 2x5      Supine   Hamstring curl + bridge green pball x10   Bridge green pball x10   Hamstring stretch with strap x30 sec    SELF CARE   K tape (2 "I" strips) bilat knees for quad stability and pain relief   07/28/22: THEREX   Nustep L6 x 5 min UEs/LEs      Sitting   Hamstring stretch 2x30 sec   Adductor stretch 2x30 sec    Standing   Quad stretch 2x30 sec   Forward step up 4" step x10 R&L   Side step up 4" step x10 R&L   Heel raise 2x10   Hamstring curl 2x10 green TB   Knee extended hip flexion 2x10 green TB  MANUAL THERAPY IASTM massage roller adductors and quads   K tape (2 "I" strips) bilat knees for quad stability and pain relief      PATIENT EDUCATION:  Education details: Exam findings, POC, and initial HEP Person educated: Patient Education method: Explanation, Demonstration, and Handouts Education comprehension: verbalized understanding, returned demonstration, and needs further education   HOME EXERCISE PROGRAM: Access Code: [email or web address] URL: [email or web address] Date: 07/14/2022 Prepared by: Estill Bamberg April Thurnell Garbe  Exercises - Supine Quadriceps Stretch with Strap on Table  - 1 x daily - 7 x weekly - 2 sets - 30 sec  hold - Seated Hamstring Stretch  - 1 x daily - 7 x weekly - 2 sets - 30 sec hold - Hooklying Hamstring Stretch with Strap  -  1 x daily - 7 x weekly - 2 sets - 30 sec hold - Seated Figure 4 Piriformis Stretch  - 1 x daily - 7 x weekly - 2 sets - 30 sec hold - Straight Leg Raise with External Rotation  - 1 x daily - 7 x weekly - 2 sets - 10 reps - Clamshell with Resistance  - 1 x daily - 7 x weekly - 2 sets - 10 reps - Supine Bridge with Mini Swiss Ball Between Knees  - 1 x daily - 7 x weekly - 3 sets - 10 reps - Hip Abduction with Resistance Loop  - 1 x daily - 7 x weekly - 2 sets - 10 reps - Hip Extension with Resistance Loop  - 1 x daily - 7 x weekly - 2 sets - 10 reps - Standing Hamstring Curl with Resistance  - 1 x daily - 7 x weekly - 2 sets - 10 reps - Seated Knee Extension with Resistance  - 1 x daily - 7 x weekly - 2 sets - 10 reps  ASSESSMENT:  CLINICAL IMPRESSION: Session focused on re-checking goals for re-cert. Pt is demonstrating increasing strength based on MMT. Pt does note improving pain with activity but not yet to goal level. Revised goals accordingly. Pt would benefit from continued PT to reach her LTGs.    OBJECTIVE IMPAIRMENTS decreased activity tolerance, decreased endurance, decreased mobility, difficulty walking, decreased strength, increased fascial restrictions, improper body mechanics, postural dysfunction, obesity, and pain.   ACTIVITY LIMITATIONS standing, transfers, and locomotion level  PARTICIPATION LIMITATIONS: shopping, community activity, and yard work  PERSONAL FACTORS Fitness and Past/current experiences are also affecting patient's functional outcome.   REHAB POTENTIAL: Good  CLINICAL DECISION MAKING: Stable/uncomplicated  EVALUATION COMPLEXITY: Low   GOALS: Goals reviewed with patient? Yes   REVISED LONG TERM GOALS: Target date: 09/15/2022   Pt will be ind with continuing and advancing HEP at home and in community Baseline: Will be going  to aquatics for additional HEP Goal status: IN PROGRESS  2.  Pt will report reduction in pain by at least 50% Baseline: 20% reduction Goal status: REVISED  3.  Pt will be able to return to ambulating at least 1 mile for return to normal activities Baseline: 1/2 mile before knee agitation Goal status: REVISED  4.  Pt will demo at least 4+/5 bilat LE strength Baseline: See MMT above Goal status: REVISED  5.  Pt will have increased FOTO score to at least 71 Baseline: 52 Goal status: IN PROGRESS     PLAN: PT FREQUENCY: 2x/week  PT DURATION: 6 weeks  PLANNED INTERVENTIONS: Therapeutic exercises, Therapeutic activity, Neuromuscular re-education, Balance training, Gait training, Patient/Family education, Self Care, Joint mobilization, Aquatic Therapy, Dry Needling, Electrical stimulation, Cryotherapy, Moist heat, Taping, Ionotophoresis '4mg'$ /ml Dexamethasone, and Manual therapy  PLAN FOR NEXT SESSION: Progress to body weight exercises and lifting as tolerated. Continue hip/knee strengthening. Aquatics   Brecklynn Jian April Ma L Hrithik Boschee, PT, DPT 08/04/2022, 8:39 AM

## 2022-08-04 NOTE — Addendum Note (Signed)
Addended by: Colbert Ewing MARIE L on: 08/04/2022 08:40 AM   Modules accepted: Orders

## 2022-08-08 ENCOUNTER — Ambulatory Visit (HOSPITAL_BASED_OUTPATIENT_CLINIC_OR_DEPARTMENT_OTHER): Payer: BC Managed Care – PPO | Attending: Family Medicine | Admitting: Physical Therapy

## 2022-08-08 ENCOUNTER — Encounter: Payer: Self-pay | Admitting: Nurse Practitioner

## 2022-08-08 ENCOUNTER — Ambulatory Visit: Payer: BC Managed Care – PPO | Admitting: Nurse Practitioner

## 2022-08-08 ENCOUNTER — Encounter (HOSPITAL_BASED_OUTPATIENT_CLINIC_OR_DEPARTMENT_OTHER): Payer: Self-pay | Admitting: Physical Therapy

## 2022-08-08 VITALS — BP 123/84 | HR 82 | Temp 98.5°F | Ht 66.0 in | Wt 302.0 lb

## 2022-08-08 DIAGNOSIS — M170 Bilateral primary osteoarthritis of knee: Secondary | ICD-10-CM | POA: Diagnosis not present

## 2022-08-08 DIAGNOSIS — E611 Iron deficiency: Secondary | ICD-10-CM

## 2022-08-08 DIAGNOSIS — M6281 Muscle weakness (generalized): Secondary | ICD-10-CM

## 2022-08-08 DIAGNOSIS — E669 Obesity, unspecified: Secondary | ICD-10-CM

## 2022-08-08 DIAGNOSIS — R262 Difficulty in walking, not elsewhere classified: Secondary | ICD-10-CM

## 2022-08-08 DIAGNOSIS — Z6841 Body Mass Index (BMI) 40.0 and over, adult: Secondary | ICD-10-CM

## 2022-08-08 MED ORDER — DICLOFENAC SODIUM 1 % EX GEL: 2.0000 g | Freq: Four times a day (QID) | CUTANEOUS | Status: DC

## 2022-08-08 NOTE — Telephone Encounter (Signed)
I spoke to pt- PA sent to plan via CoverMyMeds.

## 2022-08-08 NOTE — Therapy (Signed)
OUTPATIENT PHYSICAL THERAPY TREATMENT   Patient Name: Lisa Tran MRN: [id number] DOB:[birth year], 46 y.o., female Today's Date: 08/08/2022   PT End of Session - 08/08/22 1712     Visit Number 11    Number of Visits 24    Date for PT Re-Evaluation 09/15/22    Authorization Type BCBS    PT Start Time 7026    PT Stop Time 3785    PT Time Calculation (min) 44 min    Activity Tolerance Patient tolerated treatment well    Behavior During Therapy WFL for tasks assessed/performed              Past Medical History:  Diagnosis Date   Allergy    Anemia    Anxiety    Asthma    Back pain    Food allergy    Osteoarthritis    Swelling of lower extremity    Vitamin D deficiency    Past Surgical History:  Procedure Laterality Date   COSMETIC SURGERY     GASTRIC BYPASS  2003   KNEE SURGERY  2014   Patient Active Problem List   Diagnosis Date Noted   SOB (shortness of breath) on exertion 07/25/2022   Vitamin D deficiency 07/25/2022   Health care maintenance 07/25/2022   Elevated glucose 07/25/2022   Eating disorder 07/25/2022   Other fatigue 07/12/2022   Class 3 severe obesity with serious comorbidity and body mass index (BMI) of 45.0 to 49.9 in adult ([place]) 07/12/2022   Primary osteoarthritis of both knees 06/21/2022   Family history of colon cancer 09/23/2020   Fibroids 10/15/2017   Thrombophlebitis of superficial veins of left lower extremity 07/21/2017   Cervical paraspinal muscle spasm 10/05/2015   Iron deficiency 03/16/2015   Other specified postprocedural states 02/25/2015   Morbid obesity ([place]) 02/25/2015   H/O gastric bypass 02/25/2015    PCP: Samuel Bouche, NP  REFERRING PROVIDER: Rosemarie Ax, MD   REFERRING DIAG: M17.0 (ICD-10-CM) - Primary osteoarthritis of both knees   THERAPY DIAG:  Primary osteoarthritis of both knees  Difficulty in walking, not elsewhere classified  Muscle weakness (generalized)  Rationale for Evaluation and Treatment  Rehabilitation  ONSET DATE: Labor Day weekend  SUBJECTIVE:   SUBJECTIVE STATEMENT: Pt reports she would like an aquatic program to take to her local pool to continue strengthening LEs.  PERTINENT HISTORY: Has a history of knee surgery on the right side.   PAIN:  Are you having pain? Yes: NPRS scale: 2/10 Pain location: L knee > Rt knee Pain description: "tender" Aggravating factors: Increased weight bearing; after sitting too long and first standing up Relieving factors: No weight  PRECAUTIONS: None  WEIGHT BEARING RESTRICTIONS No  FALLS:  Has patient fallen in last 6 months? No  LIVING ENVIRONMENT: Lives with: lives with their family and lives with their spouse Lives in: House/apartment Stairs: Yes: Internal: 15 steps; on right going up Has following equipment at home: None  OCCUPATION: Desk work  PLOF: Independent  PATIENT GOALS Return back to walking   OBJECTIVE:   FROM EVAL  PATIENT SURVEYS:  FOTO 52; predicted 71  MUSCLE LENGTH: Hamstrings: Right 80 deg; Left 45 deg  PALPATION: TTP medial L knee > R knee  LOWER EXTREMITY ROM:  Active ROM Right eval Left eval  Hip flexion    Hip extension    Hip abduction    Hip adduction    Hip internal rotation    Hip external rotation    Knee flexion  105 105  Knee extension 0 0  Ankle dorsiflexion    Ankle plantarflexion    Ankle inversion    Ankle eversion     (Blank rows = not tested)  LOWER EXTREMITY MMT:  MMT Right eval Left eval Right 10/27 Left 10/27  Hip flexion 4+ 4+ 5 4+  Hip extension 4- 4- 5 4  Hip abduction 3+ 3 5 4+  Hip adduction 3 3 4+ 4  Hip internal rotation      Hip external rotation      Knee flexion 4+ 4+ 5 5  Knee extension 5 4+ 5 4+  Ankle dorsiflexion      Ankle plantarflexion      Ankle inversion      Ankle eversion       (Blank rows = not tested)  FUNCTIONAL TESTS:  N/a    TODAY'S TREATMENT: 08/08/22: Pt seen for aquatic therapy today.  Treatment took  place in water 3.25-4.75 ft in depth at the [place]. Temp of water was 92.  Pt entered/exited the pool via stairs independently with bilat rail.  * intro to [employer] * unsupported:  walking forward/ backwards, side stepping with straight knees and with increased step height * side step into squat R/L with shoulder addct (rainbow -> yellow hand floats) * SLS with 3 way leg kicks holding yellow floats on surface x 5 each LE * squats pushing yellow hand float under water x 10 * high knee marching forward/ backward with yellow hand floats at side under water for core engagement  * straddling yellow noodle:  cycling with breast stroke arms;  cc ski and jumping jacks with feet gently touching floor * forward walking kicks * STS on 3rd step x 5, on 4th step x 2 * quad stretch with foot on 2nd step; repeated with foot on 2 blue noodles x 15 sec x 2 each LE   Pt requires the buoyancy and hydrostatic pressure of water for support, and to offload joints by unweighting joint load by at least 50 % in navel deep water and by at least 75-80% in chest to neck deep water.  Viscosity of the water is needed for resistance of strengthening. Water current perturbations provides challenge to standing balance requiring increased core activation.   08/04/22: THEREX   Recumbent bike L6 x 5 min warm up   Leg press 14 plates, seat 6 DL 2x10, 8 plates SL 2x8    Sitting   Hamstring 2x 30 sec      Standing   Quad stretch 2x 30 sec    Supine   SLR 2# 2x10 R&L  VASO   X10 min, L knee, low pressure, 34 deg temp  07/31/22:  THEREX   Nustep L6 x 5 min UEs/LEs      Sitting   Hamstring x30 sec    Standing   Quad stretch x30 sec   6" runner's step up 2x10   6" lateral step up 2x10   4" step down 2x5      Supine   Hamstring curl + bridge green pball x10   Bridge green pball x10   Hamstring stretch with strap x30 sec    SELF CARE   K tape (2 "I" strips) bilat knees for quad  stability and pain relief   07/28/22: THEREX   Nustep L6 x 5 min UEs/LEs      Sitting   Hamstring stretch 2x30 sec   Adductor stretch 2x30 sec  Standing   Quad stretch 2x30 sec   Forward step up 4" step x10 R&L   Side step up 4" step x10 R&L   Heel raise 2x10   Hamstring curl 2x10 green TB   Knee extended hip flexion 2x10 green TB  MANUAL THERAPY IASTM massage roller adductors and quads   K tape (2 "I" strips) bilat knees for quad stability and pain relief      PATIENT EDUCATION:  Education details: Exam findings, POC, and initial HEP Person educated: Patient Education method: Explanation, Demonstration, and Handouts Education comprehension: verbalized understanding, returned demonstration, and needs further education   HOME EXERCISE PROGRAM: Access Code: [email or web address] URL: [email or web address] Date: 07/14/2022 Prepared by: Estill Bamberg April Thurnell Garbe  Exercises - Supine Quadriceps Stretch with Strap on Table  - 1 x daily - 7 x weekly - 2 sets - 30 sec hold - Seated Hamstring Stretch  - 1 x daily - 7 x weekly - 2 sets - 30 sec hold - Hooklying Hamstring Stretch with Strap  - 1 x daily - 7 x weekly - 2 sets - 30 sec hold - Seated Figure 4 Piriformis Stretch  - 1 x daily - 7 x weekly - 2 sets - 30 sec hold - Straight Leg Raise with External Rotation  - 1 x daily - 7 x weekly - 2 sets - 10 reps - Clamshell with Resistance  - 1 x daily - 7 x weekly - 2 sets - 10 reps - Supine Bridge with Mini Swiss Ball Between Knees  - 1 x daily - 7 x weekly - 3 sets - 10 reps - Hip Abduction with Resistance Loop  - 1 x daily - 7 x weekly - 2 sets - 10 reps - Hip Extension with Resistance Loop  - 1 x daily - 7 x weekly - 2 sets - 10 reps - Standing Hamstring Curl with Resistance  - 1 x daily - 7 x weekly - 2 sets - 10 reps - Seated Knee Extension with Resistance  - 1 x daily - 7 x weekly - 2 sets - 10 reps  * added aquatic exercises to existing HEP  ASSESSMENT:  CLINICAL  IMPRESSION: Pt tolerated aquatic exercises well with reduction of bilat knee pain.  She is confident in aquatic setting and is able to take direction from therapist on the deck.  Began creating aquatic HEP; will add to/modify during next session.  Pt would benefit from continued PT to reach her LTGs.    OBJECTIVE IMPAIRMENTS decreased activity tolerance, decreased endurance, decreased mobility, difficulty walking, decreased strength, increased fascial restrictions, improper body mechanics, postural dysfunction, obesity, and pain.   ACTIVITY LIMITATIONS standing, transfers, and locomotion level  PARTICIPATION LIMITATIONS: shopping, community activity, and yard work  PERSONAL FACTORS Fitness and Past/current experiences are also affecting patient's functional outcome.   REHAB POTENTIAL: Good  CLINICAL DECISION MAKING: Stable/uncomplicated  EVALUATION COMPLEXITY: Low   GOALS: Goals reviewed with patient? Yes   REVISED LONG TERM GOALS: Target date: 09/15/2022   Pt will be ind with continuing and advancing HEP at home and in community Baseline: Will be going to aquatics for additional HEP Goal status: IN PROGRESS  2.  Pt will report reduction in pain by at least 50% Baseline: 20% reduction Goal status: REVISED  3.  Pt will be able to return to ambulating at least 1 mile for return to normal activities Baseline: 1/2 mile before knee agitation Goal status: REVISED  4.  Pt will demo at  least 4+/5 bilat LE strength Baseline: See MMT above Goal status: REVISED  5.  Pt will have increased FOTO score to at least 71 Baseline: 52 Goal status: IN PROGRESS     PLAN: PT FREQUENCY: 2x/week  PT DURATION: 6 weeks  PLANNED INTERVENTIONS: Therapeutic exercises, Therapeutic activity, Neuromuscular re-education, Balance training, Gait training, Patient/Family education, Self Care, Joint mobilization, Aquatic Therapy, Dry Needling, Electrical stimulation, Cryotherapy, Moist heat, Taping,  Ionotophoresis '4mg'$ /ml Dexamethasone, and Manual therapy  PLAN FOR NEXT SESSION: Progress to body weight exercises and lifting as tolerated. Continue hip/knee strengthening. Aquatics  Kerin Perna, [place] 08/08/22 5:59 PM [facility] [address] [place], [place], [address] Phone: [phone]   Fax:  [phone]

## 2022-08-08 NOTE — Telephone Encounter (Signed)
[facility] Rx is denied via CoverMyMeds Outcome Denied today PA Case: [id number], Status: Denied. Notification: Completed. Drug Librarian, academic PA Form [phone] NCPDP) Original Claim Info [phone] PLAN PREFERS:  DICLOFENAC SODIUM (TOPICAL)GELTRY GENERIC   DICLOFENAC GELDRUG REQUIRES PRIOR AUTHORIZATIONNON-FORMULARY DRUG.  Pt informed. She requests Vol[facility]ren rx be [facility] [address]. See meds.

## 2022-08-10 ENCOUNTER — Encounter (HOSPITAL_BASED_OUTPATIENT_CLINIC_OR_DEPARTMENT_OTHER): Payer: Self-pay | Admitting: Physical Therapy

## 2022-08-10 ENCOUNTER — Ambulatory Visit (HOSPITAL_BASED_OUTPATIENT_CLINIC_OR_DEPARTMENT_OTHER): Payer: BC Managed Care – PPO | Attending: Family Medicine | Admitting: Physical Therapy

## 2022-08-10 DIAGNOSIS — M6281 Muscle weakness (generalized): Secondary | ICD-10-CM

## 2022-08-10 DIAGNOSIS — M170 Bilateral primary osteoarthritis of knee: Secondary | ICD-10-CM | POA: Diagnosis not present

## 2022-08-10 DIAGNOSIS — R2689 Other abnormalities of gait and mobility: Secondary | ICD-10-CM | POA: Diagnosis not present

## 2022-08-10 DIAGNOSIS — R262 Difficulty in walking, not elsewhere classified: Secondary | ICD-10-CM

## 2022-08-10 NOTE — Progress Notes (Signed)
Chief Complaint:   OBESITY Lisa Tran is here to discuss her progress with her obesity treatment plan along with follow-up of her obesity related diagnoses. Lisa Tran is on the Category 3 Plan +1500 calories and states she is following her eating plan approximately 60% of the time. Lisa Tran states she is using a stationary bike 30-60 minutes 3 times per week.  Today's visit was #: 2 Starting weight: 306 lbs Starting date: 07/25/2022 Today's weight: 302 LBS Today's date: 08/08/2022 Total lbs lost to date: 4 lbs Total lbs lost since last in-office visit: 4  Interim History: Lisa Tran has done well with weight loss since her last visit. She is currently in PT and staring aquatic therapy today. Struggling with meeting protein goals due to currently struggling with allergies. Starts the day off good but gets off track in the evening. Drinking water, coffee and tea. Rarely drinks wine.  Subjective:   1. Iron deficiency Lisa Tran is taking iron daily. History of anemia due to fibroids. Reports heavy menses monthly. Seeing GYN. Has IUD.  Assessment/Plan:   1. Iron deficiency Decrease iron to once daily and will recheck labs in 3 months.  2. Obesity, Current BMI 48.9 Lisa Tran is currently in the action stage of change. As such, her goal is to continue with weight loss efforts. She has agreed to the Category 3 Plan.   Lisa Tran is to schedule appointment with Dr. Mallie Mussel. Labs reviewed with patient.  Exercise goals: As is.  Behavioral modification strategies: increasing lean protein intake, increasing vegetables, and increasing water intake.  Lisa Tran has agreed to follow-up with our clinic in 2 weeks. She was informed of the importance of frequent follow-up visits to maximize her success with intensive lifestyle modifications for her multiple health conditions.   Objective:   Blood pressure 123/84, pulse 82, temperature 98.5 F (36.9 C), temperature source Oral, height '5\' 6"'$  (1.676 m), weight  (!) 302 lb (137 kg), SpO2 99 %. Body mass index is 48.74 kg/m.  General: Cooperative, alert, well developed, in no acute distress. HEENT: Conjunctivae and lids unremarkable. Cardiovascular: Regular rhythm.  Lungs: Normal work of breathing. Neurologic: No focal deficits.   Lab Results  Component Value Date   CREATININE 0.75 07/25/2022   BUN 14 07/25/2022   NA 138 07/25/2022   K 4.1 07/25/2022   CL 100 07/25/2022   CO2 25 07/25/2022   Lab Results  Component Value Date   ALT 35 (H) 07/25/2022   AST 34 07/25/2022   ALKPHOS 66 07/25/2022   BILITOT 0.4 07/25/2022   Lab Results  Component Value Date   HGBA1C 5.4 07/25/2022   HGBA1C 5.4 02/09/2022   Lab Results  Component Value Date   INSULIN 3.8 07/25/2022   Lab Results  Component Value Date   TSH 2.270 07/25/2022   Lab Results  Component Value Date   CHOL 169 07/25/2022   HDL 89 07/25/2022   LDLCALC 67 07/25/2022   TRIG 69 07/25/2022   CHOLHDL 2.0 02/09/2022   Lab Results  Component Value Date   VD25OH 72.3 07/25/2022   VD25OH 72 02/09/2022   VD25OH 43 09/27/2020   Lab Results  Component Value Date   WBC 5.1 02/09/2022   HGB 13.9 02/09/2022   HCT 41.1 02/09/2022   MCV 89.9 02/09/2022   PLT 352 02/09/2022   Lab Results  Component Value Date   IRON 115 02/09/2022   TIBC 334 02/09/2022   FERRITIN 259 (H) 07/25/2022   Attestation Statements:   Reviewed by  clinician on day of visit: allergies, medications, problem list, medical history, surgical history, family history, social history, and previous encounter notes.  Time spent on visit including pre-visit chart review and post-visit care and charting was 30 minutes.   I, Brendell Tyus, RMA, am acting as transcriptionist for Everardo Pacific, FNP.  I have reviewed the above documentation for accuracy and completeness, and I agree with the above. Everardo Pacific, FNP

## 2022-08-10 NOTE — Therapy (Signed)
OUTPATIENT PHYSICAL THERAPY TREATMENT   Patient Name: Lisa Tran MRN: [id number] DOB:Lisa 05, 1977, 46 y.o., female Today's Date: 08/10/2022   PT End of Session - 08/10/22 1807     Visit Number 12    Number of Visits 24    Date for PT Re-Evaluation 09/15/22    Authorization Type BCBS    PT Start Time 1705    PT Stop Time 3716    PT Time Calculation (min) 50 min    Activity Tolerance Patient tolerated treatment well    Behavior During Therapy WFL for tasks assessed/performed               Past Medical History:  Diagnosis Date   Allergy    Anemia    Anxiety    Asthma    Back pain    Food allergy    Osteoarthritis    Swelling of lower extremity    Vitamin D deficiency    Past Surgical History:  Procedure Laterality Date   COSMETIC SURGERY     GASTRIC BYPASS  2003   KNEE SURGERY  2014   Patient Active Problem List   Diagnosis Date Noted   SOB (shortness of breath) on exertion 07/25/2022   Vitamin D deficiency 07/25/2022   Health care maintenance 07/25/2022   Elevated glucose 07/25/2022   Eating disorder 07/25/2022   Other fatigue 07/12/2022   Class 3 severe obesity with serious comorbidity and body mass index (BMI) of 45.0 to 49.9 in adult ([place]) 07/12/2022   Primary osteoarthritis of both knees 06/21/2022   Family history of colon cancer 09/23/2020   Fibroids 10/15/2017   Thrombophlebitis of superficial veins of left lower extremity 07/21/2017   Cervical paraspinal muscle spasm 10/05/2015   Iron deficiency 03/16/2015   Other specified postprocedural states 02/25/2015   Morbid obesity ([place]) 02/25/2015   H/O gastric bypass 02/25/2015    PCP: Samuel Bouche, NP  REFERRING PROVIDER: Rosemarie Ax, MD   REFERRING DIAG: M17.0 (ICD-10-CM) - Primary osteoarthritis of both knees   THERAPY DIAG:  Primary osteoarthritis of both knees  Difficulty in walking, not elsewhere classified  Muscle weakness (generalized)  Other abnormalities of gait and  mobility  Rationale for Evaluation and Treatment Rehabilitation  ONSET DATE: Labor Day weekend  SUBJECTIVE:   SUBJECTIVE STATEMENT: Pt reports increase in pain next day after last session to 4/10 which continued until today.   PERTINENT HISTORY: Has a history of knee surgery on the right side.   PAIN:  Are you having pain? Yes: NPRS scale: 4/10 Pain location: L knee > Rt knee Pain description: "tender" Aggravating factors: Increased weight bearing; after sitting too long and first standing up Relieving factors: No weight  PRECAUTIONS: None  WEIGHT BEARING RESTRICTIONS No  FALLS:  Has patient fallen in last 6 months? No  LIVING ENVIRONMENT: Lives with: lives with their family and lives with their spouse Lives in: House/apartment Stairs: Yes: Internal: 15 steps; on right going up Has following equipment at home: None  OCCUPATION: Desk work  PLOF: Independent  PATIENT GOALS Return back to walking   OBJECTIVE:   FROM EVAL  PATIENT SURVEYS:  FOTO 52; predicted 71  MUSCLE LENGTH: Hamstrings: Right 80 deg; Left 45 deg  PALPATION: TTP medial L knee > R knee  LOWER EXTREMITY ROM:  Active ROM Right eval Left eval  Hip flexion    Hip extension    Hip abduction    Hip adduction    Hip internal rotation    Hip  external rotation    Knee flexion 105 105  Knee extension 0 0  Ankle dorsiflexion    Ankle plantarflexion    Ankle inversion    Ankle eversion     (Blank rows = not tested)  LOWER EXTREMITY MMT:  MMT Right eval Left eval Right 10/27 Left 10/27  Hip flexion 4+ 4+ 5 4+  Hip extension 4- 4- 5 4  Hip abduction 3+ 3 5 4+  Hip adduction 3 3 4+ 4  Hip internal rotation      Hip external rotation      Knee flexion 4+ 4+ 5 5  Knee extension 5 4+ 5 4+  Ankle dorsiflexion      Ankle plantarflexion      Ankle inversion      Ankle eversion       (Blank rows = not tested)  FUNCTIONAL TESTS:  N/a    TODAY'S TREATMENT: 08/10/22: Pt seen for  aquatic therapy today.  Treatment took place in water 3.25-4.75 ft in depth at the [place]. Temp of water was 92.  Pt entered/exited the pool via stairs independently with bilat rail.  Pt issued laminated HEP. Pt instruction throughout session to ensure understanding and indep. * unsupported:  walking forward/ backwards, side stepping with straight knees and with increased step height * side step into squat R/L with shoulder addct  yellow hand floats * SLS with 3 way leg kicks holding yellow floats on surface x 5 each LE * squats pushing yellow hand float under water x 10 * high knee marching forward/ backward with yellow hand floats at side under water for core engagement  * straddling yellow noodle:  cycling with breast stroke arms;  cc ski and jumping jacks with feet gently touching floor * STS on 3rd step x 5 * Repeated with foot on 2 blue noodles x 15 sec x 2 each LE   Pt requires the buoyancy and hydrostatic pressure of water for support, and to offload joints by unweighting joint load by at least 50 % in navel deep water and by at least 75-80% in chest to neck deep water.  Viscosity of the water is needed for resistance of strengthening. Water current perturbations provides challenge to standing balance requiring increased core activation.   08/04/22: THEREX   Recumbent bike L6 x 5 min warm up   Leg press 14 plates, seat 6 DL 2x10, 8 plates SL 2x8    Sitting   Hamstring 2x 30 sec      Standing   Quad stretch 2x 30 sec    Supine   SLR 2# 2x10 R&L  VASO   X10 min, L knee, low pressure, 34 deg temp  07/31/22:  THEREX   Nustep L6 x 5 min UEs/LEs      Sitting   Hamstring x30 sec    Standing   Quad stretch x30 sec   6" runner's step up 2x10   6" lateral step up 2x10   4" step down 2x5      Supine   Hamstring curl + bridge green pball x10   Bridge green pball x10   Hamstring stretch with strap x30 sec    SELF CARE   K tape (2 "I" strips) bilat  knees for quad stability and pain relief   07/28/22: THEREX   Nustep L6 x 5 min UEs/LEs      Sitting   Hamstring stretch 2x30 sec   Adductor stretch 2x30 sec  Standing   Quad stretch 2x30 sec   Forward step up 4" step x10 R&L   Side step up 4" step x10 R&L   Heel raise 2x10   Hamstring curl 2x10 green TB   Knee extended hip flexion 2x10 green TB  MANUAL THERAPY IASTM massage roller adductors and quads   K tape (2 "I" strips) bilat knees for quad stability and pain relief      PATIENT EDUCATION:  Education details: Exam findings, POC, and initial HEP Person educated: Patient Education method: Explanation, Demonstration, and Handouts Education comprehension: verbalized understanding, returned demonstration, and needs further education   HOME EXERCISE PROGRAM: Access Code: [email or web address] URL: [email or web address] Date: 07/14/2022 Prepared by: Estill Bamberg Lisa Tran  Exercises - Supine Quadriceps Stretch with Strap on Table  - 1 x daily - 7 x weekly - 2 sets - 30 sec hold - Seated Hamstring Stretch  - 1 x daily - 7 x weekly - 2 sets - 30 sec hold - Hooklying Hamstring Stretch with Strap  - 1 x daily - 7 x weekly - 2 sets - 30 sec hold - Seated Figure 4 Piriformis Stretch  - 1 x daily - 7 x weekly - 2 sets - 30 sec hold - Straight Leg Raise with External Rotation  - 1 x daily - 7 x weekly - 2 sets - 10 reps - Clamshell with Resistance  - 1 x daily - 7 x weekly - 2 sets - 10 reps - Supine Bridge with Mini Swiss Ball Between Knees  - 1 x daily - 7 x weekly - 3 sets - 10 reps - Hip Abduction with Resistance Loop  - 1 x daily - 7 x weekly - 2 sets - 10 reps - Hip Extension with Resistance Loop  - 1 x daily - 7 x weekly - 2 sets - 10 reps - Standing Hamstring Curl with Resistance  - 1 x daily - 7 x weekly - 2 sets - 10 reps - Seated Knee Extension with Resistance  - 1 x daily - 7 x weekly - 2 sets - 10 reps  * added aquatic exercises to existing  HEP  ASSESSMENT:  CLINICAL IMPRESSION: Pt with increased discomfort after last session. May have been secondary to volume of exercises completed knowing to  be seen just x 2 for instruction on HEP.  She is encouraged today to use OTC meds for pain relief in future as needed and instruction on modifiying HEP/daily activity to decrease incidence of flare.  She VU. She also demonstrates indep and understanding of HEP which she will complete at her [employer].  She will contact us with any questions.   OBJECTIVE IMPAIRMENTS decreased activity tolerance, decreased endurance, decreased mobility, difficulty walking, decreased strength, increased fascial restrictions, improper body mechanics, postural dysfunction, obesity, and pain.   ACTIVITY LIMITATIONS standing, transfers, and locomotion level  PARTICIPATION LIMITATIONS: shopping, community activity, and yard work  PERSONAL FACTORS Fitness and Past/current experiences are also affecting patient's functional outcome.   REHAB POTENTIAL: Good  CLINICAL DECISION MAKING: Stable/uncomplicated  EVALUATION COMPLEXITY: Low   GOALS: Goals reviewed with patient? Yes   REVISED LONG TERM GOALS: Target date: 09/15/2022   Pt will be ind with continuing and advancing HEP at home and in community Baseline: Will be going to aquatics for additional HEP Goal status: IN PROGRESS  2.  Pt will report reduction in pain by at least 50% Baseline: 20% reduction Goal status: REVISED  3.  Pt will be able  to return to ambulating at least 1 mile for return to normal activities Baseline: 1/2 mile before knee agitation Goal status: REVISED  4.  Pt will demo at least 4+/5 bilat LE strength Baseline: See MMT above Goal status: REVISED  5.  Pt will have increased FOTO score to at least 71 Baseline: 52 Goal status: IN PROGRESS     PLAN: PT FREQUENCY: 2x/week  PT DURATION: 6 weeks  PLANNED INTERVENTIONS: Therapeutic exercises, Therapeutic activity,  Neuromuscular re-education, Balance training, Gait training, Patient/Family education, Self Care, Joint mobilization, Aquatic Therapy, Dry Needling, Electrical stimulation, Cryotherapy, Moist heat, Taping, Ionotophoresis '4mg'$ /ml Dexamethasone, and Manual therapy  PLAN FOR NEXT SESSION: Progress to body weight exercises and lifting as tolerated. Continue hip/knee strengthening. Aquatics  Stanton Kidney Krum) Shakya Sebring MPT 08/10/22 6:09 PM [facility] [address] [place], [place], [address] Phone: [phone]   Fax:  [phone]

## 2022-08-14 ENCOUNTER — Ambulatory Visit: Payer: BC Managed Care – PPO | Attending: Family Medicine | Admitting: Rehabilitative and Restorative Service Providers"

## 2022-08-14 ENCOUNTER — Encounter: Payer: Self-pay | Admitting: Rehabilitative and Restorative Service Providers"

## 2022-08-14 DIAGNOSIS — M6281 Muscle weakness (generalized): Secondary | ICD-10-CM | POA: Diagnosis not present

## 2022-08-14 DIAGNOSIS — R2689 Other abnormalities of gait and mobility: Secondary | ICD-10-CM | POA: Diagnosis not present

## 2022-08-14 DIAGNOSIS — M170 Bilateral primary osteoarthritis of knee: Secondary | ICD-10-CM | POA: Diagnosis not present

## 2022-08-14 DIAGNOSIS — R262 Difficulty in walking, not elsewhere classified: Secondary | ICD-10-CM | POA: Diagnosis not present

## 2022-08-14 NOTE — Therapy (Signed)
OUTPATIENT PHYSICAL THERAPY TREATMENT   Patient Name: Lisa Tran MRN: [id number] DOB:04/14/1976, 46 y.o., female Today's Date: 08/14/2022   PT End of Session - 08/14/22 0804     Visit Number 13    Number of Visits 24    Date for PT Re-Evaluation 09/15/22    PT Start Time 0800    PT Stop Time 0848    PT Time Calculation (min) 48 min    Activity Tolerance Patient tolerated treatment well              Past Medical History:  Diagnosis Date   Allergy    Anemia    Anxiety    Asthma    Back pain    Food allergy    Osteoarthritis    Swelling of lower extremity    Vitamin D deficiency    Past Surgical History:  Procedure Laterality Date   COSMETIC SURGERY     GASTRIC BYPASS  2003   KNEE SURGERY  2014   Patient Active Problem List   Diagnosis Date Noted   SOB (shortness of breath) on exertion 07/25/2022   Vitamin D deficiency 07/25/2022   Health care maintenance 07/25/2022   Elevated glucose 07/25/2022   Eating disorder 07/25/2022   Other fatigue 07/12/2022   Class 3 severe obesity with serious comorbidity and body mass index (BMI) of 45.0 to 49.9 in adult ([place]) 07/12/2022   Primary osteoarthritis of both knees 06/21/2022   Family history of colon cancer 09/23/2020   Fibroids 10/15/2017   Thrombophlebitis of superficial veins of left lower extremity 07/21/2017   Cervical paraspinal muscle spasm 10/05/2015   Iron deficiency 03/16/2015   Other specified postprocedural states 02/25/2015   Morbid obesity ([place]) 02/25/2015   H/O gastric bypass 02/25/2015    PCP: Samuel Bouche, NP  REFERRING PROVIDER: Rosemarie Ax, MD   REFERRING DIAG: M17.0 (ICD-10-CM) - Primary osteoarthritis of both knees   THERAPY DIAG:  Primary osteoarthritis of both knees  Difficulty in walking, not elsewhere classified  Muscle weakness (generalized)  Other abnormalities of gait and mobility  Rationale for Evaluation and Treatment Rehabilitation  ONSET DATE: Labor Day  weekend  SUBJECTIVE:   SUBJECTIVE STATEMENT: Went to the [facility] pool yesterday. She did gentle exercises and did well. Water temperature was cooler and that made a difference in how she felt. Will incorporate water exercises into her routine. Still some pain and discomfort in the Lt > Rt knee. She has the voltaren gel for pain relief.    PERTINENT HISTORY: Has a history of knee surgery on the right side.   PAIN:  Are you having pain? Yes: NPRS scale: 3/10 Pain location: L knee Pain description: "tender" Aggravating factors: Increased weight bearing; after sitting too long and first standing up Relieving factors: No weight  PRECAUTIONS: None  WEIGHT BEARING RESTRICTIONS No  FALLS:  Has patient fallen in last 6 months? No  LIVING ENVIRONMENT: Lives with: lives with their family and lives with their spouse Lives in: House/apartment Stairs: Yes: Internal: 15 steps; on right going up Has following equipment at home: None  OCCUPATION: Desk work  PLOF: Independent  PATIENT GOALS Return back to walking   OBJECTIVE:   FROM EVAL  PATIENT SURVEYS:  FOTO 52; predicted 71  MUSCLE LENGTH: Hamstrings: Right 80 deg; Left 45 deg  PALPATION: TTP medial L knee > R knee  LOWER EXTREMITY ROM:  Active ROM Right eval Left eval  Hip flexion    Hip extension  Hip abduction    Hip adduction    Hip internal rotation    Hip external rotation    Knee flexion 105 105  Knee extension 0 0  Ankle dorsiflexion    Ankle plantarflexion    Ankle inversion    Ankle eversion     (Blank rows = not tested)  LOWER EXTREMITY MMT:  MMT Right eval Left eval Right 10/27 Left 10/27  Hip flexion 4+ 4+ 5 4+  Hip extension 4- 4- 5 4  Hip abduction 3+ 3 5 4+  Hip adduction 3 3 4+ 4  Hip internal rotation      Hip external rotation      Knee flexion 4+ 4+ 5 5  Knee extension 5 4+ 5 4+  Ankle dorsiflexion      Ankle plantarflexion      Ankle inversion      Ankle eversion       (Blank  rows = not tested)  FUNCTIONAL TESTS:  N/a    TODAY'S TREATMENT: 08/14/22:  THEREX Nustep L6 x 5 min  Leg press 14 plates, seat 6 DL 2x10, 8 plates SL 2x8  Sitting:  HS stretch 30 sec x 2 each LE  Sit to stand hinged hip x 10 slow eccentric stand to sit   Standing: Mini wall squat 10 sec hold x 5 reps  Single leg stance 20 sec x 3 each side   Neuromuscular re-ed: Working on standing without hyperextension of knees   Vaso: X10 min, L knee, low pressure, 34 deg temp   08/04/22: THEREX   Recumbent bike L6 x 5 min warm up   Leg press 14 plates, seat 6 DL 2x10, 8 plates SL 2x8    Sitting   Hamstring 2x 30 sec      Standing   Quad stretch 2x 30 sec    Supine   SLR 2# 2x10 R&L  VASO   X10 min, L knee, low pressure, 34 deg temp  07/31/22:  THEREX   Nustep L6 x 5 min UEs/LEs      Sitting   Hamstring x30 sec    Standing   Quad stretch x30 sec   6" runner's step up 2x10   6" lateral step up 2x10   4" step down 2x5      Supine   Hamstring curl + bridge green pball x10   Bridge green pball x10   Hamstring stretch with strap x30 sec    SELF CARE   K tape (2 "I" strips) bilat knees for quad stability and pain relief  PATIENT EDUCATION:  Education details: Exam findings, POC, and initial HEP Person educated: Patient Education method: Explanation, Demonstration, and Handouts Education comprehension: verbalized understanding, returned demonstration, and needs further education   HOME EXERCISE PROGRAM: Access Code: [email or web address] URL: [email or web address] Date: 08/14/2022 Prepared by: Gillermo Murdoch  Exercises - Supine Quadriceps Stretch with Strap on Table  - 1 x daily - 7 x weekly - 2 sets - 30 sec hold - Seated Hamstring Stretch  - 1 x daily - 7 x weekly - 2 sets - 30 sec hold - Hooklying Hamstring Stretch with Strap  - 1 x daily - 7 x weekly - 2 sets - 30 sec hold - Seated Figure 4 Piriformis Stretch  - 1 x daily - 7 x weekly - 2 sets - 30 sec  hold - Straight Leg Raise with External Rotation  - 1 x daily - 7 x weekly - 2 sets -  10 reps - Hip Abduction with Resistance Loop  - 1 x daily - 7 x weekly - 2 sets - 10 reps - Hip Extension with Resistance Loop  - 1 x daily - 7 x weekly - 2 sets - 10 reps - Standing Hamstring Curl with Resistance  - 1 x daily - 7 x weekly - 2 sets - 10 reps - Seated Knee Extension with Resistance  - 1 x daily - 7 x weekly - 2 sets - 10 reps - Side Stepping with Resistance at Ankles  - 1 x daily - 7 x weekly - 3 sets - 10 reps - Forward Monster Walks  - 1 x daily - 7 x weekly - 3 sets - 10 reps - Backward Monster Walks  - 1 x daily - 7 x weekly - 3 sets - 10 reps - Bridge with Heels on [employer]  - 1 x daily - 7 x weekly - 2 sets - 10 reps - Bridge with Hamstring Curl on [employer]  - 1 x daily - 7 x weekly - 2 sets - 10 reps - Walking March  - 1 x daily - 7 x weekly - 3 sets - Side Stepping with Hand Floats  - 1 x daily - 7 x weekly - 3 sets - Squat  - 1 x daily - 7 x weekly - 1 sets - 10 reps - Standing 3-way leg kick holding noodle  - 1 x daily - 7 x weekly - 2 sets - 5 reps - Sports administrator with Noodle at [employer]  - 1 x daily - 7 x weekly - 1 sets - 2-3 reps - 15-20 seconds hold - Side to Side Hamstring Stretch with Noodle at [employer]  - 1 x daily - 7 x weekly - 1 sets - 2-3 reps - 15-20 seconds  hold - Jumping Jacks suspended on noodle  - 1 x daily - 7 x weekly - 3 sets - Seated Straddle on Flotation Forward Breast Stroke Arms and Bicycle Legs  - 1 x daily - 7 x weekly - 3 sets - Farmer's Carry with Kettlebells  - 1 x daily - 7 x weekly - 3 sets - 10 reps - Sit to Stand  - 2 x daily - 7 x weekly - 1 sets - 10 reps - 3-5 sec  hold - Wall Quarter Squat  - 2 x daily - 7 x weekly - 1-2 sets - 10 reps - 10 sec  hold - Single Leg Stance  - 2 x daily - 7 x weekly - 2 sets - 3-5 reps - 20 sec hold ASSESSMENT:  CLINICAL IMPRESSION: Continued work on strengthening bilat LE's adding body weight resistive  exercises. Neuromuscular re-ed to avoid hyperextension of knees in standing. Discussion of HEP and gym exercises. Patient will continue with aquatic exercises independently in community.   OBJECTIVE IMPAIRMENTS decreased activity tolerance, decreased endurance, decreased mobility, difficulty walking, decreased strength, increased fascial restrictions, improper body mechanics, postural dysfunction, obesity, and pain.   ACTIVITY LIMITATIONS standing, transfers, and locomotion level  PARTICIPATION LIMITATIONS: shopping, community activity, and yard work  PERSONAL FACTORS Fitness and Past/current experiences are also affecting patient's functional outcome.   REHAB POTENTIAL: Good  CLINICAL DECISION MAKING: Stable/uncomplicated  EVALUATION COMPLEXITY: Low   GOALS: Goals reviewed with patient? Yes   REVISED LONG TERM GOALS: Target date: 09/15/2022   Pt will be ind with continuing and advancing HEP at home and in community Baseline: Will  be going to aquatics for additional HEP Goal status: IN PROGRESS  2.  Pt will report reduction in pain by at least 50% Baseline: 20% reduction Goal status: REVISED  3.  Pt will be able to return to ambulating at least 1 mile for return to normal activities Baseline: 1/2 mile before knee agitation Goal status: REVISED  4.  Pt will demo at least 4+/5 bilat LE strength Baseline: See MMT above Goal status: REVISED  5.  Pt will have increased FOTO score to at least 71 Baseline: 52 Goal status: IN PROGRESS     PLAN: PT FREQUENCY: 2x/week  PT DURATION: 6 weeks  PLANNED INTERVENTIONS: Therapeutic exercises, Therapeutic activity, Neuromuscular re-education, Balance training, Gait training, Patient/Family education, Self Care, Joint mobilization, Aquatic Therapy, Dry Needling, Electrical stimulation, Cryotherapy, Moist heat, Taping, Ionotophoresis '4mg'$ /ml Dexamethasone, and Manual therapy  PLAN FOR NEXT SESSION: Assess response to body weight  exercises; trial of lifting as tolerated at next visit. Continue hip/knee strengthening. Aquatics in [place], [place], MPH 08/14/2022, 8:40 AM

## 2022-08-18 ENCOUNTER — Encounter: Payer: Self-pay | Admitting: Physical Therapy

## 2022-08-18 ENCOUNTER — Ambulatory Visit: Payer: BC Managed Care – PPO | Admitting: Physical Therapy

## 2022-08-18 DIAGNOSIS — R2689 Other abnormalities of gait and mobility: Secondary | ICD-10-CM

## 2022-08-18 DIAGNOSIS — M170 Bilateral primary osteoarthritis of knee: Secondary | ICD-10-CM | POA: Diagnosis not present

## 2022-08-18 DIAGNOSIS — M6281 Muscle weakness (generalized): Secondary | ICD-10-CM

## 2022-08-18 DIAGNOSIS — R262 Difficulty in walking, not elsewhere classified: Secondary | ICD-10-CM

## 2022-08-18 NOTE — Therapy (Signed)
OUTPATIENT PHYSICAL THERAPY TREATMENT   Patient Name: Lisa Tran MRN: [id number] DOB:1976-10-05, 46 y.o., female Today's Date: 08/18/2022   PT End of Session - 08/18/22 0839     Visit Number 14    Number of Visits 24    Date for PT Re-Evaluation 09/15/22    Authorization Type BCBS    PT Start Time 0800    PT Stop Time 0849    PT Time Calculation (min) 49 min    Activity Tolerance Patient tolerated treatment well    Behavior During Therapy [facility] for tasks assessed/performed               Past Medical History:  Diagnosis Date   Allergy    Anemia    Anxiety    Asthma    Back pain    Food allergy    Osteoarthritis    Swelling of lower extremity    Vitamin D deficiency    Past Surgical History:  Procedure Laterality Date   COSMETIC SURGERY     GASTRIC BYPASS  2003   KNEE SURGERY  2014   Patient Active Problem List   Diagnosis Date Noted   SOB (shortness of breath) on exertion 07/25/2022   Vitamin D deficiency 07/25/2022   Health care maintenance 07/25/2022   Elevated glucose 07/25/2022   Eating disorder 07/25/2022   Other fatigue 07/12/2022   Class 3 severe obesity with serious comorbidity and body mass index (BMI) of 45.0 to 49.9 in adult ([place]) 07/12/2022   Primary osteoarthritis of both knees 06/21/2022   Family history of colon cancer 09/23/2020   Fibroids 10/15/2017   Thrombophlebitis of superficial veins of left lower extremity 07/21/2017   Cervical paraspinal muscle spasm 10/05/2015   Iron deficiency 03/16/2015   Other specified postprocedural states 02/25/2015   Morbid obesity ([place]) 02/25/2015   H/O gastric bypass 02/25/2015    PCP: Samuel Bouche, NP  REFERRING PROVIDER: Rosemarie Ax, MD   REFERRING DIAG: M17.0 (ICD-10-CM) - Primary osteoarthritis of both knees   THERAPY DIAG:  Primary osteoarthritis of both knees  Difficulty in walking, not elsewhere classified  Muscle weakness (generalized)  Other abnormalities of gait and  mobility  Rationale for Evaluation and Treatment Rehabilitation  ONSET DATE: Labor Day weekend  SUBJECTIVE:   SUBJECTIVE STATEMENT: Pt states she is "a little achey" but overall feeling better. She states she tried to go to the pool at the [facility] but they were having swim lessons  PERTINENT HISTORY: Has a history of knee surgery on the right side.   PAIN:  Are you having pain? Yes: NPRS scale: 3/10 Pain location: L knee Pain description: "achey" Aggravating factors: Increased weight bearing; after sitting too long and first standing up Relieving factors: No weight    PATIENT GOALS Return back to walking   OBJECTIVE:   FROM EVAL  PATIENT SURVEYS:  FOTO 52; predicted 71  MUSCLE LENGTH: Hamstrings: Right 80 deg; Left 45 deg  PALPATION: TTP medial L knee > R knee  LOWER EXTREMITY ROM:  Active ROM Right eval Left eval  Hip flexion    Hip extension    Hip abduction    Hip adduction    Hip internal rotation    Hip external rotation    Knee flexion 105 105  Knee extension 0 0  Ankle dorsiflexion    Ankle plantarflexion    Ankle inversion    Ankle eversion     (Blank rows = not tested)  LOWER EXTREMITY MMT:  MMT  Right eval Left eval Right 10/27 Left 10/27  Hip flexion 4+ 4+ 5 4+  Hip extension 4- 4- 5 4  Hip abduction 3+ 3 5 4+  Hip adduction 3 3 4+ 4  Hip internal rotation      Hip external rotation      Knee flexion 4+ 4+ 5 5  Knee extension 5 4+ 5 4+  Ankle dorsiflexion      Ankle plantarflexion      Ankle inversion      Ankle eversion       (Blank rows = not tested)  FUNCTIONAL TESTS:  N/a    TODAY'S TREATMENT: [facility] Adult PT Treatment:                                                DATE: 08/18/22 Therapeutic Exercise: Nustep L6 x 5 min for warm up Seated HS stretch 2 x 30 sec bilat TKE with ball on wall 10 x 2 sec hold Leg press seat 6, DL 14 plates 2 x 10, SL 8 plates 2 x 10 Hip 3 way with foot on towel, focus on stance control, x 10  bilat STS with 10#KB 2 x 10 focus on eccentric control  Modalities: Vaso Lt knee low pressure 34 degrees x 10 min    08/14/22:  THEREX Nustep L6 x 5 min  Leg press 14 plates, seat 6 DL 2x10, 8 plates SL 2x8  Sitting:  HS stretch 30 sec x 2 each LE  Sit to stand hinged hip x 10 slow eccentric stand to sit   Standing: Mini wall squat 10 sec hold x 5 reps  Single leg stance 20 sec x 3 each side   Neuromuscular re-ed: Working on standing without hyperextension of knees   Vaso: X10 min, L knee, low pressure, 34 deg temp   08/04/22: THEREX   Recumbent bike L6 x 5 min warm up   Leg press 14 plates, seat 6 DL 2x10, 8 plates SL 2x8    Sitting   Hamstring 2x 30 sec      Standing   Quad stretch 2x 30 sec    Supine   SLR 2# 2x10 R&L  VASO   X10 min, L knee, low pressure, 34 deg temp   PATIENT EDUCATION:  Education details: Exam findings, POC, and initial HEP Person educated: Patient Education method: Explanation, Demonstration, and Handouts Education comprehension: verbalized understanding, returned demonstration, and needs further education   HOME EXERCISE PROGRAM: Access Code: [email or web address] URL: [email or web address] Date: 08/14/2022 Prepared by: Gillermo Murdoch  Exercises - Supine Quadriceps Stretch with Strap on Table  - 1 x daily - 7 x weekly - 2 sets - 30 sec hold - Seated Hamstring Stretch  - 1 x daily - 7 x weekly - 2 sets - 30 sec hold - Hooklying Hamstring Stretch with Strap  - 1 x daily - 7 x weekly - 2 sets - 30 sec hold - Seated Figure 4 Piriformis Stretch  - 1 x daily - 7 x weekly - 2 sets - 30 sec hold - Straight Leg Raise with External Rotation  - 1 x daily - 7 x weekly - 2 sets - 10 reps - Hip Abduction with Resistance Loop  - 1 x daily - 7 x weekly - 2 sets - 10 reps - Hip Extension with Resistance Loop  -  1 x daily - 7 x weekly - 2 sets - 10 reps - Standing Hamstring Curl with Resistance  - 1 x daily - 7 x weekly - 2 sets - 10 reps - Seated  Knee Extension with Resistance  - 1 x daily - 7 x weekly - 2 sets - 10 reps - Side Stepping with Resistance at Ankles  - 1 x daily - 7 x weekly - 3 sets - 10 reps - Forward Monster Walks  - 1 x daily - 7 x weekly - 3 sets - 10 reps - Backward Monster Walks  - 1 x daily - 7 x weekly - 3 sets - 10 reps - Bridge with Heels on [employer]  - 1 x daily - 7 x weekly - 2 sets - 10 reps - Bridge with Hamstring Curl on [employer]  - 1 x daily - 7 x weekly - 2 sets - 10 reps - Walking March  - 1 x daily - 7 x weekly - 3 sets - Side Stepping with Hand Floats  - 1 x daily - 7 x weekly - 3 sets - Squat  - 1 x daily - 7 x weekly - 1 sets - 10 reps - Standing 3-way leg kick holding noodle  - 1 x daily - 7 x weekly - 2 sets - 5 reps - Sports administrator with Noodle at [employer]  - 1 x daily - 7 x weekly - 1 sets - 2-3 reps - 15-20 seconds hold - Side to Side Hamstring Stretch with Noodle at [employer]  - 1 x daily - 7 x weekly - 1 sets - 2-3 reps - 15-20 seconds  hold - Jumping Jacks suspended on noodle  - 1 x daily - 7 x weekly - 3 sets - Seated Straddle on Flotation Forward Breast Stroke Arms and Bicycle Legs  - 1 x daily - 7 x weekly - 3 sets - Farmer's Carry with Kettlebells  - 1 x daily - 7 x weekly - 3 sets - 10 reps - Sit to Stand  - 2 x daily - 7 x weekly - 1 sets - 10 reps - 3-5 sec  hold - Wall Quarter Squat  - 2 x daily - 7 x weekly - 1-2 sets - 10 reps - 10 sec  hold - Single Leg Stance  - 2 x daily - 7 x weekly - 2 sets - 3-5 reps - 20 sec hold ASSESSMENT:  CLINICAL IMPRESSION: Continued focus on knee strength and stability. Pt with good tolerance to new exercises, fatigues quickly with hip 3 way   GOALS: Goals reviewed with patient? Yes   REVISED LONG TERM GOALS: Target date: 09/15/2022   Pt will be ind with continuing and advancing HEP at home and in community Baseline: Will be going to aquatics for additional HEP Goal status: IN PROGRESS  2.  Pt will report reduction in pain by at least  50% Baseline: 20% reduction Goal status: REVISED  3.  Pt will be able to return to ambulating at least 1 mile for return to normal activities Baseline: 1/2 mile before knee agitation Goal status: REVISED  4.  Pt will demo at least 4+/5 bilat LE strength Baseline: See MMT above Goal status: REVISED  5.  Pt will have increased FOTO score to at least 71 Baseline: 52 Goal status: IN PROGRESS     PLAN: PT FREQUENCY: 2x/week  PT DURATION: 6 weeks  PLANNED INTERVENTIONS: Therapeutic exercises, Therapeutic  activity, Neuromuscular re-education, Balance training, Gait training, Patient/Family education, Self Care, Joint mobilization, Aquatic Therapy, Dry Needling, Electrical stimulation, Cryotherapy, Moist heat, Taping, Ionotophoresis '4mg'$ /ml Dexamethasone, and Manual therapy  PLAN FOR NEXT SESSION: Assess response to body weight exercises; trial of lifting as tolerated at next visit. Continue hip/knee strengthening. Aquatics in [place], PT 08/18/2022, 8:40 AM

## 2022-08-21 ENCOUNTER — Encounter: Payer: BC Managed Care – PPO | Admitting: Physical Therapy

## 2022-08-23 ENCOUNTER — Ambulatory Visit: Payer: BC Managed Care – PPO | Admitting: Physical Therapy

## 2022-08-23 ENCOUNTER — Encounter: Payer: Self-pay | Admitting: Physical Therapy

## 2022-08-23 ENCOUNTER — Ambulatory Visit: Payer: BC Managed Care – PPO | Admitting: Bariatrics

## 2022-08-23 ENCOUNTER — Encounter: Payer: Self-pay | Admitting: Bariatrics

## 2022-08-23 VITALS — BP 105/72 | HR 85 | Temp 97.7°F | Ht 66.0 in | Wt 297.0 lb

## 2022-08-23 DIAGNOSIS — F5089 Other specified eating disorder: Secondary | ICD-10-CM

## 2022-08-23 DIAGNOSIS — M170 Bilateral primary osteoarthritis of knee: Secondary | ICD-10-CM | POA: Diagnosis not present

## 2022-08-23 DIAGNOSIS — Z9884 Bariatric surgery status: Secondary | ICD-10-CM

## 2022-08-23 DIAGNOSIS — R262 Difficulty in walking, not elsewhere classified: Secondary | ICD-10-CM

## 2022-08-23 DIAGNOSIS — E669 Obesity, unspecified: Secondary | ICD-10-CM

## 2022-08-23 DIAGNOSIS — M6281 Muscle weakness (generalized): Secondary | ICD-10-CM

## 2022-08-23 DIAGNOSIS — R2689 Other abnormalities of gait and mobility: Secondary | ICD-10-CM

## 2022-08-23 DIAGNOSIS — Z6841 Body Mass Index (BMI) 40.0 and over, adult: Secondary | ICD-10-CM | POA: Diagnosis not present

## 2022-08-23 NOTE — Therapy (Signed)
OUTPATIENT PHYSICAL THERAPY TREATMENT   Patient Name: Lisa Tran MRN: [id number] DOB:Mar 22, 1976, 46 y.o., female Today's Date: 08/23/2022   PT End of Session - 08/23/22 1010     Visit Number 15    Number of Visits 24    Date for PT Re-Evaluation 09/15/22    PT Start Time 0930    PT Stop Time 1020    PT Time Calculation (min) 50 min    Activity Tolerance Patient tolerated treatment well    Behavior During Therapy [facility] for tasks assessed/performed                Past Medical History:  Diagnosis Date   Allergy    Anemia    Anxiety    Asthma    Back pain    Food allergy    Osteoarthritis    Swelling of lower extremity    Vitamin D deficiency    Past Surgical History:  Procedure Laterality Date   COSMETIC SURGERY     GASTRIC BYPASS  2003   KNEE SURGERY  2014   Patient Active Problem List   Diagnosis Date Noted   SOB (shortness of breath) on exertion 07/25/2022   Vitamin D deficiency 07/25/2022   Health care maintenance 07/25/2022   Elevated glucose 07/25/2022   Eating disorder 07/25/2022   Other fatigue 07/12/2022   Class 3 severe obesity with serious comorbidity and body mass index (BMI) of 45.0 to 49.9 in adult ([place]) 07/12/2022   Primary osteoarthritis of both knees 06/21/2022   Family history of colon cancer 09/23/2020   Fibroids 10/15/2017   Thrombophlebitis of superficial veins of left lower extremity 07/21/2017   Cervical paraspinal muscle spasm 10/05/2015   Iron deficiency 03/16/2015   Other specified postprocedural states 02/25/2015   Morbid obesity ([place]) 02/25/2015   H/O gastric bypass 02/25/2015    PCP: Samuel Bouche, NP  REFERRING PROVIDER: Rosemarie Ax, MD   REFERRING DIAG: M17.0 (ICD-10-CM) - Primary osteoarthritis of both knees   THERAPY DIAG:  Primary osteoarthritis of both knees  Muscle weakness (generalized)  Difficulty in walking, not elsewhere classified  Other abnormalities of gait and mobility  Rationale for  Evaluation and Treatment Rehabilitation  ONSET DATE: Labor Day weekend  SUBJECTIVE:   SUBJECTIVE STATEMENT: Pt states she has continued go swimming but is only able to get into the pool 1x a week. She is also doing the exercise bike  PERTINENT HISTORY: Has a history of knee surgery on the right side.   PAIN:  Are you having pain? Yes: NPRS scale: 3/10 Pain location: L knee Pain description: "achey" Aggravating factors: Increased weight bearing; after sitting too long and first standing up Relieving factors: No weight    PATIENT GOALS Return back to walking   OBJECTIVE:   FROM EVAL  PATIENT SURVEYS:  FOTO 52; predicted 71  MUSCLE LENGTH: Hamstrings: Right 80 deg; Left 45 deg  PALPATION: TTP medial L knee > R knee  LOWER EXTREMITY ROM:  Active ROM Right eval Left eval  Hip flexion    Hip extension    Hip abduction    Hip adduction    Hip internal rotation    Hip external rotation    Knee flexion 105 105  Knee extension 0 0  Ankle dorsiflexion    Ankle plantarflexion    Ankle inversion    Ankle eversion     (Blank rows = not tested)  LOWER EXTREMITY MMT:  MMT Right eval Left eval Right 10/27 Left 10/27  Hip flexion 4+ 4+ 5 4+  Hip extension 4- 4- 5 4  Hip abduction 3+ 3 5 4+  Hip adduction 3 3 4+ 4  Hip internal rotation      Hip external rotation      Knee flexion 4+ 4+ 5 5  Knee extension 5 4+ 5 4+  Ankle dorsiflexion      Ankle plantarflexion      Ankle inversion      Ankle eversion       (Blank rows = not tested)  FUNCTIONAL TESTS:  N/a    TODAY'S TREATMENT: [facility] Adult PT Treatment:                                                DATE: 08/23/22 Therapeutic Exercise: Nustep L6 x 5 min for warm up TKE ball on wall 3 sec hold x 12 Hip 3 way on slider x 10 bilat Leg press seat 6 DL 15 plates 2 x 10, SL 9 plates 2 x 10 Modified SL deadlift 10#KB x 10 bilat  Modalities: Vaso Lt knee x 10 min low pressure 34degrees    [facility] Adult  PT Treatment:                                                DATE: 08/18/22 Therapeutic Exercise: Nustep L6 x 5 min for warm up Seated HS stretch 2 x 30 sec bilat TKE with ball on wall 10 x 2 sec hold Leg press seat 6, DL 14 plates 2 x 10, SL 8 plates 2 x 10 Hip 3 way with foot on towel, focus on stance control, x 10 bilat STS with 10#KB 2 x 10 focus on eccentric control  Modalities: Vaso Lt knee low pressure 34 degrees x 10 min    08/14/22:  THEREX Nustep L6 x 5 min  Leg press 14 plates, seat 6 DL 2x10, 8 plates SL 2x8  Sitting:  HS stretch 30 sec x 2 each LE  Sit to stand hinged hip x 10 slow eccentric stand to sit   Standing: Mini wall squat 10 sec hold x 5 reps  Single leg stance 20 sec x 3 each side   Neuromuscular re-ed: Working on standing without hyperextension of knees   Vaso: X10 min, L knee, low pressure, 34 deg temp   PATIENT EDUCATION:  Education details: Exam findings, POC, and initial HEP Person educated: Patient Education method: Explanation, Demonstration, and Handouts Education comprehension: verbalized understanding, returned demonstration, and needs further education   HOME EXERCISE PROGRAM: Access Code: [email or web address] URL: [email or web address] Date: 08/14/2022 Prepared by: Gillermo Murdoch  Exercises - Supine Quadriceps Stretch with Strap on Table  - 1 x daily - 7 x weekly - 2 sets - 30 sec hold - Seated Hamstring Stretch  - 1 x daily - 7 x weekly - 2 sets - 30 sec hold - Hooklying Hamstring Stretch with Strap  - 1 x daily - 7 x weekly - 2 sets - 30 sec hold - Seated Figure 4 Piriformis Stretch  - 1 x daily - 7 x weekly - 2 sets - 30 sec hold - Straight Leg Raise with External Rotation  - 1 x daily - 7 x weekly -  2 sets - 10 reps - Hip Abduction with Resistance Loop  - 1 x daily - 7 x weekly - 2 sets - 10 reps - Hip Extension with Resistance Loop  - 1 x daily - 7 x weekly - 2 sets - 10 reps - Standing Hamstring Curl with Resistance  - 1 x  daily - 7 x weekly - 2 sets - 10 reps - Seated Knee Extension with Resistance  - 1 x daily - 7 x weekly - 2 sets - 10 reps - Side Stepping with Resistance at Ankles  - 1 x daily - 7 x weekly - 3 sets - 10 reps - Forward Monster Walks  - 1 x daily - 7 x weekly - 3 sets - 10 reps - Backward Monster Walks  - 1 x daily - 7 x weekly - 3 sets - 10 reps - Bridge with Heels on [employer]  - 1 x daily - 7 x weekly - 2 sets - 10 reps - Bridge with Hamstring Curl on [employer]  - 1 x daily - 7 x weekly - 2 sets - 10 reps - Walking March  - 1 x daily - 7 x weekly - 3 sets - Side Stepping with Hand Floats  - 1 x daily - 7 x weekly - 3 sets - Squat  - 1 x daily - 7 x weekly - 1 sets - 10 reps - Standing 3-way leg kick holding noodle  - 1 x daily - 7 x weekly - 2 sets - 5 reps - Sports administrator with Noodle at [employer]  - 1 x daily - 7 x weekly - 1 sets - 2-3 reps - 15-20 seconds hold - Side to Side Hamstring Stretch with Noodle at [employer]  - 1 x daily - 7 x weekly - 1 sets - 2-3 reps - 15-20 seconds  hold - Jumping Jacks suspended on noodle  - 1 x daily - 7 x weekly - 3 sets - Seated Straddle on Flotation Forward Breast Stroke Arms and Bicycle Legs  - 1 x daily - 7 x weekly - 3 sets - Farmer's Carry with Kettlebells  - 1 x daily - 7 x weekly - 3 sets - 10 reps - Sit to Stand  - 2 x daily - 7 x weekly - 1 sets - 10 reps - 3-5 sec  hold - Wall Quarter Squat  - 2 x daily - 7 x weekly - 1-2 sets - 10 reps - 10 sec  hold - Single Leg Stance  - 2 x daily - 7 x weekly - 2 sets - 3-5 reps - 20 sec hold ASSESSMENT:  CLINICAL IMPRESSION: Pt improving strength. Able to increase wt for leg press both single and double leg. Added modified deadlift for HS strengthening   GOALS: Goals reviewed with patient? Yes   REVISED LONG TERM GOALS: Target date: 09/15/2022   Pt will be ind with continuing and advancing HEP at home and in community Baseline: Will be going to aquatics for additional HEP Goal status: IN  PROGRESS  2.  Pt will report reduction in pain by at least 50% Baseline: 20% reduction Goal status: REVISED  3.  Pt will be able to return to ambulating at least 1 mile for return to normal activities Baseline: 1/2 mile before knee agitation Goal status: REVISED  4.  Pt will demo at least 4+/5 bilat LE strength Baseline: See MMT above Goal status: REVISED  5.  Pt will have increased FOTO score to at least 71 Baseline: 52 Goal status: IN PROGRESS     PLAN: PT FREQUENCY: 2x/week  PT DURATION: 6 weeks  PLANNED INTERVENTIONS: Therapeutic exercises, Therapeutic activity, Neuromuscular re-education, Balance training, Gait training, Patient/Family education, Self Care, Joint mobilization, Aquatic Therapy, Dry Needling, Electrical stimulation, Cryotherapy, Moist heat, Taping, Ionotophoresis '4mg'$ /ml Dexamethasone, and Manual therapy  PLAN FOR NEXT SESSION: Assess response to body weight exercises; trial of lifting as tolerated at next visit. Continue hip/knee strengthening. Aquatics in [place], PT 08/23/2022, 10:11 AM

## 2022-08-25 ENCOUNTER — Ambulatory Visit: Payer: BC Managed Care – PPO | Admitting: Physical Therapy

## 2022-08-25 ENCOUNTER — Encounter: Payer: Self-pay | Admitting: Physical Therapy

## 2022-08-25 DIAGNOSIS — M6281 Muscle weakness (generalized): Secondary | ICD-10-CM

## 2022-08-25 DIAGNOSIS — R2689 Other abnormalities of gait and mobility: Secondary | ICD-10-CM

## 2022-08-25 DIAGNOSIS — M170 Bilateral primary osteoarthritis of knee: Secondary | ICD-10-CM

## 2022-08-25 DIAGNOSIS — R262 Difficulty in walking, not elsewhere classified: Secondary | ICD-10-CM | POA: Diagnosis not present

## 2022-08-25 NOTE — Therapy (Signed)
OUTPATIENT PHYSICAL THERAPY TREATMENT   Patient Name: Lisa Tran MRN: [id number] DOB:05/31/1976, 46 y.o., female Today's Date: 08/25/2022   PT End of Session - 08/25/22 0803     Visit Number 16    Number of Visits 24    Date for PT Re-Evaluation 09/15/22    Authorization Type [employer]    PT Start Time 0804    PT Stop Time 0845    PT Time Calculation (min) 41 min    Activity Tolerance Patient tolerated treatment well    Behavior During Therapy [facility] for tasks assessed/performed                Past Medical History:  Diagnosis Date   Allergy    Anemia    Anxiety    Asthma    Back pain    Food allergy    Osteoarthritis    Swelling of lower extremity    Vitamin D deficiency    Past Surgical History:  Procedure Laterality Date   COSMETIC SURGERY     GASTRIC BYPASS  2003   KNEE SURGERY  2014   Patient Active Problem List   Diagnosis Date Noted   SOB (shortness of breath) on exertion 07/25/2022   Vitamin D deficiency 07/25/2022   Health care maintenance 07/25/2022   Elevated glucose 07/25/2022   Eating disorder 07/25/2022   Other fatigue 07/12/2022   Class 3 severe obesity with serious comorbidity and body mass index (BMI) of 45.0 to 49.9 in adult ([place]) 07/12/2022   Primary osteoarthritis of both knees 06/21/2022   Family history of colon cancer 09/23/2020   Fibroids 10/15/2017   Thrombophlebitis of superficial veins of left lower extremity 07/21/2017   Cervical paraspinal muscle spasm 10/05/2015   Iron deficiency 03/16/2015   Other specified postprocedural states 02/25/2015   Morbid obesity ([place]) 02/25/2015   H/O gastric bypass 02/25/2015    PCP: Samuel Bouche, NP  REFERRING PROVIDER: Rosemarie Ax, MD   REFERRING DIAG: M17.0 (ICD-10-CM) - Primary osteoarthritis of both knees   THERAPY DIAG:  Primary osteoarthritis of both knees  Muscle weakness (generalized)  Difficulty in walking, not elsewhere classified  Other abnormalities of gait and  mobility  Rationale for Evaluation and Treatment Rehabilitation  ONSET DATE: Labor Day weekend  SUBJECTIVE:   SUBJECTIVE STATEMENT: Pt reports she enjoyed aquatics. Tried to go to [employer] and do aquatics there during the week day but the pool was too packed. Knee pain comes and goes.   PERTINENT HISTORY: Has a history of knee surgery on the right side.   PAIN:  Are you having pain? Yes: NPRS scale: 3/10 Pain location: L knee Pain description: "achey" Aggravating factors: Increased weight bearing; after sitting too long and first standing up Relieving factors: No weight    PATIENT GOALS Return back to walking   OBJECTIVE:   FROM EVAL  PATIENT SURVEYS:  FOTO 52; predicted 71  MUSCLE LENGTH: Hamstrings: Right 80 deg; Left 45 deg  LOWER EXTREMITY ROM:  Active ROM Right eval Left eval  Hip flexion    Hip extension    Hip abduction    Hip adduction    Hip internal rotation    Hip external rotation    Knee flexion 105 105  Knee extension 0 0  Ankle dorsiflexion    Ankle plantarflexion    Ankle inversion    Ankle eversion     (Blank rows = not tested)  LOWER EXTREMITY MMT:  MMT Right eval Left eval Right 10/27  Left 10/27  Hip flexion 4+ 4+ 5 4+  Hip extension 4- 4- 5 4  Hip abduction 3+ 3 5 4+  Hip adduction 3 3 4+ 4  Hip internal rotation      Hip external rotation      Knee flexion 4+ 4+ 5 5  Knee extension 5 4+ 5 4+  Ankle dorsiflexion      Ankle plantarflexion      Ankle inversion      Ankle eversion       (Blank rows = not tested)  FUNCTIONAL TESTS:  N/a    TODAY'S TREATMENT:  [facility] Adult PT Treatment:                                                DATE: 08/25/22 Therapeutic Exercise: Nustep L6 x 5 min for warm up Leg press seat 6 DL 15 plates 3 x 10, SL 9 plates 3 x 10 Deadlift two 10#KB 3x 10 bilat Hip 3 way on slider x 10 bilat     [facility] Adult PT Treatment:                                                DATE:  08/23/22 Therapeutic Exercise: Nustep L6 x 5 min for warm up TKE ball on wall 3 sec hold x 12 Hip 3 way on slider x 10 bilat Leg press seat 6 DL 15 plates 2 x 10, SL 9 plates 2 x 10 Modified SL deadlift 10#KB x 10 bilat  Modalities: Vaso Lt knee x 10 min low pressure 34degrees    [facility] Adult PT Treatment:                                                DATE: 08/18/22 Therapeutic Exercise: Nustep L6 x 5 min for warm up  Leg press seat 6, DL 14 plates 2 x 10, SL 8 plates 2 x 10 Hip 3 way with foot on towel, focus on stance control, x 10 bilat STS with 10#KB 2 x 10 focus on eccentric control  Modalities: Vaso Lt knee low pressure 34 degrees x 10 min    08/14/22:  THEREX Nustep L6 x 5 min  Leg press 14 plates, seat 6 DL 2x10, 8 plates SL 2x8  Sitting:  HS stretch 30 sec x 2 each LE  Sit to stand hinged hip x 10 slow eccentric stand to sit   Standing: Mini wall squat 10 sec hold x 5 reps  Single leg stance 20 sec x 3 each side   Neuromuscular re-ed: Working on standing without hyperextension of knees   Vaso: X10 min, L knee, low pressure, 34 deg temp   PATIENT EDUCATION:  Education details: Exam findings, POC, and initial HEP Person educated: Patient Education method: Explanation, Demonstration, and Handouts Education comprehension: verbalized understanding, returned demonstration, and needs further education   HOME EXERCISE PROGRAM: Access Code: [email or web address] URL: [email or web address] Date: 08/14/2022 Prepared by: Gillermo Murdoch  Exercises - Supine Quadriceps Stretch with Strap on Table  - 1 x daily - 7 x weekly - 2 sets -  30 sec hold - Seated Hamstring Stretch  - 1 x daily - 7 x weekly - 2 sets - 30 sec hold - Hooklying Hamstring Stretch with Strap  - 1 x daily - 7 x weekly - 2 sets - 30 sec hold - Seated Figure 4 Piriformis Stretch  - 1 x daily - 7 x weekly - 2 sets - 30 sec hold - Straight Leg Raise with External Rotation  - 1 x daily - 7 x weekly - 2  sets - 10 reps - Hip Abduction with Resistance Loop  - 1 x daily - 7 x weekly - 2 sets - 10 reps - Hip Extension with Resistance Loop  - 1 x daily - 7 x weekly - 2 sets - 10 reps - Standing Hamstring Curl with Resistance  - 1 x daily - 7 x weekly - 2 sets - 10 reps - Seated Knee Extension with Resistance  - 1 x daily - 7 x weekly - 2 sets - 10 reps - Side Stepping with Resistance at Ankles  - 1 x daily - 7 x weekly - 3 sets - 10 reps - Forward Monster Walks  - 1 x daily - 7 x weekly - 3 sets - 10 reps - Backward Monster Walks  - 1 x daily - 7 x weekly - 3 sets - 10 reps - Bridge with Heels on [employer]  - 1 x daily - 7 x weekly - 2 sets - 10 reps - Bridge with Hamstring Curl on [employer]  - 1 x daily - 7 x weekly - 2 sets - 10 reps - Walking March  - 1 x daily - 7 x weekly - 3 sets - Side Stepping with Hand Floats  - 1 x daily - 7 x weekly - 3 sets - Squat  - 1 x daily - 7 x weekly - 1 sets - 10 reps - Standing 3-way leg kick holding noodle  - 1 x daily - 7 x weekly - 2 sets - 5 reps - Sports administrator with Noodle at [employer]  - 1 x daily - 7 x weekly - 1 sets - 2-3 reps - 15-20 seconds hold - Side to Side Hamstring Stretch with Noodle at [employer]  - 1 x daily - 7 x weekly - 1 sets - 2-3 reps - 15-20 seconds  hold - Jumping Jacks suspended on noodle  - 1 x daily - 7 x weekly - 3 sets - Seated Straddle on Flotation Forward Breast Stroke Arms and Bicycle Legs  - 1 x daily - 7 x weekly - 3 sets - Farmer's Carry with Kettlebells  - 1 x daily - 7 x weekly - 3 sets - 10 reps - Sit to Stand  - 2 x daily - 7 x weekly - 1 sets - 10 reps - 3-5 sec  hold - Wall Quarter Squat  - 2 x daily - 7 x weekly - 1-2 sets - 10 reps - 10 sec  hold - Single Leg Stance  - 2 x daily - 7 x weekly - 2 sets - 3-5 reps - 20 sec hold ASSESSMENT:  CLINICAL IMPRESSION: Continued to progress pt's strengthening and lifting. Pt is tolerating exercises well. Pt continues to demonstrate good gains.    GOALS: Goals reviewed  with patient? Yes   REVISED LONG TERM GOALS: Target date: 09/15/2022   Pt will be ind with continuing and advancing HEP at home and in community Baseline:  Will be going to aquatics for additional HEP Goal status: IN PROGRESS  2.  Pt will report reduction in pain by at least 50% Baseline: 20% reduction Goal status: REVISED  3.  Pt will be able to return to ambulating at least 1 mile for return to normal activities Baseline: 1/2 mile before knee agitation Goal status: REVISED  4.  Pt will demo at least 4+/5 bilat LE strength Baseline: See MMT above Goal status: REVISED  5.  Pt will have increased FOTO score to at least 71 Baseline: 52 Goal status: IN PROGRESS     PLAN: PT FREQUENCY: 2x/week  PT DURATION: 6 weeks  PLANNED INTERVENTIONS: Therapeutic exercises, Therapeutic activity, Neuromuscular re-education, Balance training, Gait training, Patient/Family education, Self Care, Joint mobilization, Aquatic Therapy, Dry Needling, Electrical stimulation, Cryotherapy, Moist heat, Taping, Ionotophoresis '4mg'$ /ml Dexamethasone, and Manual therapy  PLAN FOR NEXT SESSION: Continue lifting and strengthening exercises. Continue hip/knee strengthening. Aquatics in community    Upton Russey April Gordy Levan, PT, DPT 08/25/2022, 8:04 AM

## 2022-08-26 NOTE — Progress Notes (Unsigned)
Office: [phone]  /  Fax: [phone]    Date: 08/28/2022   Appointment Start Time: 3:05pm Duration: 42 minutes Provider: Glennie Isle, Psy.D. Type of Session: Intake for Individual Therapy  Location of Patient: Home (private location) Location of Provider: Provider's home (private office) Type of Contact: Telepsychological Visit via MyChart Video Visit  Informed Consent: Prior to proceeding with today's appointment, two pieces of identifying information were obtained. In addition, Lavanda's physical location at the time of this appointment was obtained as well a phone number she could be reached at in the event of technical difficulties. Kristeen Miss and this provider participated in today's telepsychological service.   The provider's role was explained to [employer]. The provider reviewed and discussed issues of confidentiality, privacy, and limits therein (e.g., reporting obligations). In addition to verbal informed consent, written informed consent for psychological services was obtained prior to the initial appointment. Since the clinic is not a 24/7 crisis center, mental health emergency resources were shared and this  provider explained MyChart, e-mail, voicemail, and/or other messaging systems should be utilized only for non-emergency reasons. This provider also explained that information obtained during appointments will be placed in Maisa's medical record and relevant information will be shared with other providers at [facility] for coordination of care. Mazell agreed information may be shared with other [facility] providers as needed for coordination of care and by signing the service agreement document, she provided written consent for coordination of care. Prior to initiating telepsychological services, Trulee completed an informed consent document, which included the development of a safety plan (i.e., an emergency contact and emergency  resources) in the event of an emergency/crisis. Topaz verbally acknowledged understanding she is ultimately responsible for understanding her insurance benefits for telepsychological and in-person services. This provider also reviewed confidentiality, as it relates to telepsychological services. Lorana  acknowledged understanding that appointments cannot be recorded without both party consent and she is aware she is responsible for securing confidentiality on her end of the session. Odie verbally consented to proceed.  Chief Complaint/HPI: Lisa Tran was referred by Dr. Jearld Lesch due to other disorder of eating on 08/23/2022. The note for the initial appointment with Dr. Jearld Lesch on 07/25/2022 indicated the following: "Eriel's habits were reviewed today and are as follows: she thinks her family will eat healthier with her, her desired weight loss is 106 lbs, she has been heavy most of her life, her heaviest weight ever was 408 pounds, she has significant food cravings issues, she snacks frequently in the evenings, she skips meals frequently, she is frequently drinking liquids with calories, she frequently makes poor food choices, she frequently eats larger portions than normal, and she struggles with emotional eating." Kyisha's Food and Mood (modified PHQ-9) score on 07/25/2022 was 13.  During today's appointment, Kissy was verbally administered a questionnaire assessing various behaviors related to emotional eating behaviors. Jordi endorsed the following: overeat when you are celebrating, experience food cravings on a regular basis, eat certain foods when you are anxious, stressed, depressed, or your feelings are hurt, use food to help you cope with emotional situations, find food is comforting to you, overeat when you are angry or upset, overeat when you are worried about something, and eat as a reward. She shared she craves sweets. Marabeth believes the onset of emotional eating behaviors was  likely in childhood, and described the current frequency of emotional eating behaviors as "few times a week." In addition, Spring denied a history of binge eating  behaviors. Ashton denied a history of significantly restricting food intake, purging and engagement in other compensatory strategies, and has never been diagnosed with an eating disorder. She also denied a history of treatment for emotional eating. She stated she had gastric bypass in 2001. Furthermore, Cherrise denied other problems of concern.    Mental Status Examination:  Appearance: neat Behavior: appropriate to circumstances Mood: neutral Affect: mood congruent Speech: WNL Eye Contact: appropriate Psychomotor Activity: WNL Gait: unable to assess  Thought Process: linear, logical, and goal directed and denies suicidal, homicidal, and self-harm ideation, plan and intent  Thought Content/Perception: no hallucinations, delusions, bizarre thinking or behavior endorsed or observed Orientation: AAOx4 Memory/Concentration: memory, attention, language, and fund of knowledge intact  Insight/Judgment: fair  Family & Psychosocial History: Danelia reported she is married and she does not have any children. 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 Additionally, Dorreen shared her highest level of education obtained is an associate's degree. Currently, Mikalyn's social support system consists of her husband and best friend. 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 For shortness of breath or wheezing 1 each 3   cetirizine (ZYRTEC) 10 MG tablet Take 10 mg by mouth daily.     Cholecalciferol (VITAMIN D3) 1000 units CAPS Take by mouth.     diclofenac Sodium (PENNSAID) 2 % SOLN Place 2 Pump (40 mg total) onto the skin 2 (two) times daily. 112 g 11   ferrous fumarate-iron polysaccharide complex (TANDEM) 162-115.2 MG CAPS capsule Take by mouth.     Multiple Vitamin (MULTIVITAMIN WITH MINERALS) TABS Take 1 tablet by mouth daily.     Omega-3 Fatty Acids (FISH OIL PO) Take by mouth.     Probiotic Product (PROBIOTIC PO) Take by mouth.     Current Facility-Administered Medications on File Prior to Visit  Medication Dose Route Frequency Provider Last Rate Last Admin   diclofenac Sodium (VOLTAREN) 1 % topical gel 2 g  2 g Topical QID Rosemarie Ax, MD       levonorgestrel [facility]) 20 MCG/24HR IUD   Intrauterine Once Emily Filbert, MD      Kristeen Miss reported she is medication compliant.   Mental Health History: Mariann reported she has never attended therapeutic services. She denied a history of psychotropic medications. Tiena reported there is no history of hospitalizations for psychiatric concerns. Yana reported a family history of depression and dementia (maternal grandmother). Doria reported there is no history of trauma including psychological, physical , and sexual abuse, as well as neglect.   Meila described her typical mood lately as "stressed" due to work and being a caregiver, adding she is  now trying to focus on her health to help cope with the aforementioned. She shared work has "always been demanding;" however, the ongoing work stress and having to care for her grandmother has made it challenging.  Buffie  reported consuming a standard pour of wine approximately 2xs a month when having dinner with her best friend. She denied tobacco use. She denied illicit/recreational substance use. Furthermore, Kristeen Miss indicated she is not experiencing the following: hallucinations and delusions, paranoia, symptoms of mania , social withdrawal, crying spells, panic attacks, memory concerns, and obsessions and compulsions. She also denied history of and current suicidal ideation, plan, and intent; history of and current homicidal ideation, plan, and intent; and history of and current engagement in self-harm.  Legal History: Imanni reported there is no history of legal involvement.   Structured Assessments Results: The Patient Health Questionnaire-9 (PHQ-9) is a self-report measure that assesses symptoms and severity of depression over the course of the last two weeks. Jennings obtained a score of 3 suggesting minimal depression. Rahma finds the endorsed symptoms to be somewhat difficult. [0= Not at all; 1= Several days; 2= More than half the days; 3= Nearly every day] Little interest or pleasure in doing things 0  Feeling down, depressed, or hopeless 0  Trouble falling or staying asleep, or sleeping too much 0  Feeling tired or having little energy 0  Poor appetite or overeating 1  Feeling bad about yourself --- or that you are a failure or have let yourself or your family down 1  Trouble concentrating on things, such as reading the newspaper or watching television 1  Moving or speaking so slowly that other people could have noticed? Or the opposite --- being so fidgety or restless that you have been moving around a lot more than usual 0  Thoughts that you would be better off dead or hurting yourself in some way 0  PHQ-9 Score 3    The Generalized Anxiety Disorder-7 (GAD-7) is a brief self-report measure that assesses symptoms of anxiety over the course of the last two weeks. Anshika obtained a score of 2  suggesting minimal anxiety. Vicy finds the endorsed symptoms to be somewhat difficult. [0= Not at all; 1= Several days; 2= Over half the days; 3= Nearly every day] Feeling nervous, anxious, on edge 0  Not being able to stop or control worrying 0  Worrying too much about different things 1  Trouble relaxing 1  Being so restless that it's hard to sit still 0  Becoming easily annoyed or irritable 0  Feeling afraid as if something awful might happen 0  GAD-7 Score 2   Interventions:  Conducted a chart review Focused on rapport building Verbally administered PHQ-9 and GAD-7 for symptom monitoring Verbally administered Food & Mood questionnaire to assess various behaviors related to emotional eating Provided emphatic reflections and validation Collaborated with patient on a treatment goal  Psychoeducation provided regarding physical versus emotional hunger Recommended/discussed option for longer-term therapeutic services  Diagnostic Impressions & Provisional DSM-5 Diagnosis(es): Jakeria reported engagement in emotional eating behaviors starting in childhood and described the current frequency as  "few times a week." She denied engagement in any other disordered eating behaviors. 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 Based on the aforementioned, the following diagnoses were assigned: F50.89 Other Specified Feeding or Eating Disorder, Emotional Eating Behaviors and  F43.20 Adjustment Disorder, Unspecified .   Plan: Kiela appears able and willing to  participate as evidenced by collaboration on a treatment goal, engagement in reciprocal conversation, and asking questions as needed for clarification. The next appointment is scheduled for 09/11/2022 at 4pm, which will be via MyChart Video Visit. The following treatment goal was established: increase coping skills. This provider will regularly review the  treatment plan and medical chart to keep informed of status changes. Augusta expressed understanding and agreement with the initial treatment plan of care. Iliany will be sent a handout via e-mail to utilize between now and the next appointment to increase awareness of hunger patterns and subsequent eating. Analayah provided verbal consent during today's appointment for this provider to send the handout via e-mail. Additionally, she provided verbal consent for this provider to place a referral with [facility].

## 2022-08-28 ENCOUNTER — Telehealth (INDEPENDENT_AMBULATORY_CARE_PROVIDER_SITE_OTHER): Payer: BC Managed Care – PPO | Admitting: Psychology

## 2022-08-28 ENCOUNTER — Ambulatory Visit: Payer: BC Managed Care – PPO | Admitting: Physical Therapy

## 2022-08-28 ENCOUNTER — Encounter: Payer: Self-pay | Admitting: Physical Therapy

## 2022-08-28 DIAGNOSIS — R2689 Other abnormalities of gait and mobility: Secondary | ICD-10-CM | POA: Diagnosis not present

## 2022-08-28 DIAGNOSIS — F4320 Adjustment disorder, unspecified: Secondary | ICD-10-CM | POA: Diagnosis not present

## 2022-08-28 DIAGNOSIS — R262 Difficulty in walking, not elsewhere classified: Secondary | ICD-10-CM

## 2022-08-28 DIAGNOSIS — F5089 Other specified eating disorder: Secondary | ICD-10-CM

## 2022-08-28 DIAGNOSIS — M170 Bilateral primary osteoarthritis of knee: Secondary | ICD-10-CM

## 2022-08-28 DIAGNOSIS — M6281 Muscle weakness (generalized): Secondary | ICD-10-CM

## 2022-08-28 NOTE — Therapy (Addendum)
OUTPATIENT PHYSICAL THERAPY TREATMENT   Patient Name: Lisa Tran MRN: [id number] DOB:1976-10-04, 46 y.o., female Today's Date: 08/28/2022   PT End of Session - 08/28/22 0804     Visit Number 17    Number of Visits 24    Date for PT Re-Evaluation 09/15/22    Authorization Type BCBS    PT Start Time 0804    PT Stop Time 0845    PT Time Calculation (min) 41 min    Activity Tolerance Patient tolerated treatment well    Behavior During Therapy [facility] for tasks assessed/performed                Past Medical History:  Diagnosis Date   Allergy    Anemia    Anxiety    Asthma    Back pain    Food allergy    Osteoarthritis    Swelling of lower extremity    Vitamin D deficiency    Past Surgical History:  Procedure Laterality Date   COSMETIC SURGERY     GASTRIC BYPASS  2003   KNEE SURGERY  2014   Patient Active Problem List   Diagnosis Date Noted   SOB (shortness of breath) on exertion 07/25/2022   Vitamin D deficiency 07/25/2022   Health care maintenance 07/25/2022   Elevated glucose 07/25/2022   Eating disorder 07/25/2022   Other fatigue 07/12/2022   Class 3 severe obesity with serious comorbidity and body mass index (BMI) of 45.0 to 49.9 in adult ([place]) 07/12/2022   Primary osteoarthritis of both knees 06/21/2022   Family history of colon cancer 09/23/2020   Fibroids 10/15/2017   Thrombophlebitis of superficial veins of left lower extremity 07/21/2017   Cervical paraspinal muscle spasm 10/05/2015   Iron deficiency 03/16/2015   Other specified postprocedural states 02/25/2015   Morbid obesity ([place]) 02/25/2015   H/O gastric bypass 02/25/2015    PCP: Samuel Bouche, NP  REFERRING PROVIDER: Rosemarie Ax, MD   REFERRING DIAG: M17.0 (ICD-10-CM) - Primary osteoarthritis of both knees   THERAPY DIAG:  Primary osteoarthritis of both knees  Muscle weakness (generalized)  Difficulty in walking, not elsewhere classified  Other abnormalities of gait and  mobility  Rationale for Evaluation and Treatment Rehabilitation  ONSET DATE: Labor Day weekend  SUBJECTIVE:   SUBJECTIVE STATEMENT: Pt states she did feel it in her hamstrings after doing the deadlifts. Felt it some in her knees. Feeling achy from the cold this morning.   PERTINENT HISTORY: Has a history of knee surgery on the right side.   PAIN:  Are you having pain? Yes: NPRS scale: 3/10 Pain location: bilat knee Pain description: "achey" Aggravating factors: Increased weight bearing; after sitting too long and first standing up Relieving factors: No weight    PATIENT GOALS Return back to walking   OBJECTIVE:   FROM EVAL  PATIENT SURVEYS:  FOTO 52; predicted 71  MUSCLE LENGTH: Hamstrings: Right 80 deg; Left 45 deg  LOWER EXTREMITY ROM:  Active ROM Right eval Left eval  Hip flexion    Hip extension    Hip abduction    Hip adduction    Hip internal rotation    Hip external rotation    Knee flexion 105 105  Knee extension 0 0  Ankle dorsiflexion    Ankle plantarflexion    Ankle inversion    Ankle eversion     (Blank rows = not tested)  LOWER EXTREMITY MMT:  MMT Right eval Left eval Right 10/27 Left 10/27  Hip  flexion 4+ 4+ 5 4+  Hip extension 4- 4- 5 4  Hip abduction 3+ 3 5 4+  Hip adduction 3 3 4+ 4  Hip internal rotation      Hip external rotation      Knee flexion 4+ 4+ 5 5  Knee extension 5 4+ 5 4+  Ankle dorsiflexion      Ankle plantarflexion      Ankle inversion      Ankle eversion       (Blank rows = not tested)  FUNCTIONAL TESTS:  N/a    TODAY'S TREATMENT:  [facility] Adult PT Treatment:                                                DATE: 08/28/22 Therapeutic Exercise: Nustep L6 x 5 min Leg press seat 5 DL 16 plates 2 x 10, SL 10 plates 2 x 8 R&L SL heel raise 2x10 R&L Sled push and pull 50# 3x15' Forward lunge 3x15' with two 5# weights Hip 3 way on slider x 10 bilat Modalities: Vaso Lt knee x 10 min low pressure  34degrees   [facility] Adult PT Treatment:                                                DATE: 08/25/22 Therapeutic Exercise: Nustep L6 x 5 min for warm up Leg press seat 6 DL 15 plates 3 x 10, SL 9 plates 3 x 10 Deadlift two 10#KB 3x 10 bilat Hip 3 way on slider x 10 bilat     [facility] Adult PT Treatment:                                                DATE: 08/23/22 Therapeutic Exercise: Nustep L6 x 5 min for warm up TKE ball on wall 3 sec hold x 12 Hip 3 way on slider x 10 bilat Leg press seat 6 DL 15 plates 2 x 10, SL 9 plates 2 x 10 Modified SL deadlift 10#KB x 10 bilat  Modalities: Vaso Lt knee x 10 min low pressure 34degrees    PATIENT EDUCATION:  Education details: Exam findings, POC, and initial HEP Person educated: Patient Education method: Explanation, Demonstration, and Handouts Education comprehension: verbalized understanding, returned demonstration, and needs further education   HOME EXERCISE PROGRAM: Access Code: [email or web address] URL: [email or web address] Date: 08/14/2022 Prepared by: Gillermo Murdoch  Exercises - Supine Quadriceps Stretch with Strap on Table  - 1 x daily - 7 x weekly - 2 sets - 30 sec hold - Seated Hamstring Stretch  - 1 x daily - 7 x weekly - 2 sets - 30 sec hold - Hooklying Hamstring Stretch with Strap  - 1 x daily - 7 x weekly - 2 sets - 30 sec hold - Seated Figure 4 Piriformis Stretch  - 1 x daily - 7 x weekly - 2 sets - 30 sec hold - Straight Leg Raise with External Rotation  - 1 x daily - 7 x weekly - 2 sets - 10 reps - Hip Abduction with Resistance Loop  - 1 x  daily - 7 x weekly - 2 sets - 10 reps - Hip Extension with Resistance Loop  - 1 x daily - 7 x weekly - 2 sets - 10 reps - Standing Hamstring Curl with Resistance  - 1 x daily - 7 x weekly - 2 sets - 10 reps - Seated Knee Extension with Resistance  - 1 x daily - 7 x weekly - 2 sets - 10 reps - Side Stepping with Resistance at Ankles  - 1 x daily - 7 x weekly - 3 sets - 10 reps -  Forward Monster Walks  - 1 x daily - 7 x weekly - 3 sets - 10 reps - Backward Monster Walks  - 1 x daily - 7 x weekly - 3 sets - 10 reps - Bridge with Heels on [employer]  - 1 x daily - 7 x weekly - 2 sets - 10 reps - Bridge with Hamstring Curl on [employer]  - 1 x daily - 7 x weekly - 2 sets - 10 reps - Walking March  - 1 x daily - 7 x weekly - 3 sets - Side Stepping with Hand Floats  - 1 x daily - 7 x weekly - 3 sets - Squat  - 1 x daily - 7 x weekly - 1 sets - 10 reps - Standing 3-way leg kick holding noodle  - 1 x daily - 7 x weekly - 2 sets - 5 reps - Sports administrator with Noodle at [employer]  - 1 x daily - 7 x weekly - 1 sets - 2-3 reps - 15-20 seconds hold - Side to Side Hamstring Stretch with Noodle at [employer]  - 1 x daily - 7 x weekly - 1 sets - 2-3 reps - 15-20 seconds  hold - Jumping Jacks suspended on noodle  - 1 x daily - 7 x weekly - 3 sets - Seated Straddle on Flotation Forward Breast Stroke Arms and Bicycle Legs  - 1 x daily - 7 x weekly - 3 sets - Farmer's Carry with Kettlebells  - 1 x daily - 7 x weekly - 3 sets - 10 reps - Sit to Stand  - 2 x daily - 7 x weekly - 1 sets - 10 reps - 3-5 sec  hold - Wall Quarter Squat  - 2 x daily - 7 x weekly - 1-2 sets - 10 reps - 10 sec  hold - Single Leg Stance  - 2 x daily - 7 x weekly - 2 sets - 3-5 reps - 20 sec hold  ASSESSMENT:  CLINICAL IMPRESSION: Pt able to increase weight on leg press. Continued progression of strength this session.    GOALS: Goals reviewed with patient? Yes   REVISED LONG TERM GOALS: Target date: 09/15/2022   Pt will be ind with continuing and advancing HEP at home and in community Baseline: Will be going to aquatics for additional HEP Goal status: IN PROGRESS  2.  Pt will report reduction in pain by at least 50% Baseline: 20% reduction Goal status: REVISED  3.  Pt will be able to return to ambulating at least 1 mile for return to normal activities Baseline: 1/2 mile before knee agitation Goal  status: REVISED  4.  Pt will demo at least 4+/5 bilat LE strength Baseline: See MMT above Goal status: REVISED  5.  Pt will have increased FOTO score to at least 71 Baseline: 52 Goal status: IN PROGRESS  PLAN: PT FREQUENCY: 2x/week  PT DURATION: 6 weeks  PLANNED INTERVENTIONS: Therapeutic exercises, Therapeutic activity, Neuromuscular re-education, Balance training, Gait training, Patient/Family education, Self Care, Joint mobilization, Aquatic Therapy, Dry Needling, Electrical stimulation, Cryotherapy, Moist heat, Taping, Ionotophoresis '4mg'$ /ml Dexamethasone, and Manual therapy  PLAN FOR NEXT SESSION: Continue lifting and strengthening exercises. Continue hip/knee strengthening. Aquatics in community    Afua Hoots April Gordy Levan, PT, DPT 08/28/2022, 8:06 AM

## 2022-09-04 ENCOUNTER — Ambulatory Visit: Payer: BC Managed Care – PPO | Admitting: Physical Therapy

## 2022-09-04 ENCOUNTER — Encounter: Payer: Self-pay | Admitting: Physical Therapy

## 2022-09-04 DIAGNOSIS — M6281 Muscle weakness (generalized): Secondary | ICD-10-CM

## 2022-09-04 DIAGNOSIS — R2689 Other abnormalities of gait and mobility: Secondary | ICD-10-CM

## 2022-09-04 DIAGNOSIS — M170 Bilateral primary osteoarthritis of knee: Secondary | ICD-10-CM

## 2022-09-04 DIAGNOSIS — R262 Difficulty in walking, not elsewhere classified: Secondary | ICD-10-CM | POA: Diagnosis not present

## 2022-09-04 NOTE — Therapy (Signed)
OUTPATIENT PHYSICAL THERAPY TREATMENT   Patient Name: Lisa Tran MRN: [id number] DOB:07/09/1976, 46 y.o., female Today's Date: 09/04/2022   PT End of Session - 09/04/22 0812     Visit Number 18    Number of Visits 24    Date for PT Re-Evaluation 09/15/22    Authorization Type BCBS    PT Start Time 0803    PT Stop Time 0845    PT Time Calculation (min) 42 min    Activity Tolerance Patient tolerated treatment well    Behavior During Therapy WFL for tasks assessed/performed                Past Medical History:  Diagnosis Date   Allergy    Anemia    Anxiety    Asthma    Back pain    Food allergy    Osteoarthritis    Swelling of lower extremity    Vitamin D deficiency    Past Surgical History:  Procedure Laterality Date   COSMETIC SURGERY     GASTRIC BYPASS  2003   KNEE SURGERY  2014   Patient Active Problem List   Diagnosis Date Noted   SOB (shortness of breath) on exertion 07/25/2022   Vitamin D deficiency 07/25/2022   Health care maintenance 07/25/2022   Elevated glucose 07/25/2022   Eating disorder 07/25/2022   Other fatigue 07/12/2022   Class 3 severe obesity with serious comorbidity and body mass index (BMI) of 45.0 to 49.9 in adult ([place]) 07/12/2022   Primary osteoarthritis of both knees 06/21/2022   Family history of colon cancer 09/23/2020   Fibroids 10/15/2017   Thrombophlebitis of superficial veins of left lower extremity 07/21/2017   Cervical paraspinal muscle spasm 10/05/2015   Iron deficiency 03/16/2015   Other specified postprocedural states 02/25/2015   Morbid obesity ([place]) 02/25/2015   H/O gastric bypass 02/25/2015    PCP: Samuel Bouche, NP  REFERRING PROVIDER: Rosemarie Ax, MD   REFERRING DIAG: M17.0 (ICD-10-CM) - Primary osteoarthritis of both knees   THERAPY DIAG:  Primary osteoarthritis of both knees  Muscle weakness (generalized)  Difficulty in walking, not elsewhere classified  Other abnormalities of gait and  mobility  Rationale for Evaluation and Treatment Rehabilitation  ONSET DATE: Labor Day weekend  SUBJECTIVE:   SUBJECTIVE STATEMENT: Pt reports she walked a mile and a half which aggravated her knees but by the end of the day could feel her knees start aching. Took advil.   PERTINENT HISTORY: Has a history of knee surgery on the right side.   PAIN:  Are you having pain? Yes: NPRS scale: 3/10 Pain location: bilat knee Pain description: "achey" Aggravating factors: Increased weight bearing; after sitting too long and first standing up Relieving factors: No weight    PATIENT GOALS Return back to walking   OBJECTIVE:   FROM EVAL  PATIENT SURVEYS:  FOTO 52; predicted 71  MUSCLE LENGTH: Hamstrings: Right 80 deg; Left 45 deg  LOWER EXTREMITY ROM:  Active ROM Right eval Left eval  Hip flexion    Hip extension    Hip abduction    Hip adduction    Hip internal rotation    Hip external rotation    Knee flexion 105 105  Knee extension 0 0  Ankle dorsiflexion    Ankle plantarflexion    Ankle inversion    Ankle eversion     (Blank rows = not tested)  LOWER EXTREMITY MMT:  MMT Right eval Left eval Right 10/27 Left 10/27  Hip flexion 4+ 4+ 5 4+  Hip extension 4- 4- 5 4  Hip abduction 3+ 3 5 4+  Hip adduction 3 3 4+ 4  Hip internal rotation      Hip external rotation      Knee flexion 4+ 4+ 5 5  Knee extension 5 4+ 5 4+  Ankle dorsiflexion      Ankle plantarflexion      Ankle inversion      Ankle eversion       (Blank rows = not tested)  FUNCTIONAL TESTS:  N/a    TODAY'S TREATMENT:  [facility] Adult PT Treatment:                                                DATE: 09/04/22 Therapeutic Exercise: Nustep L6 x 5 min Figure 4 stretch seated x30 sec Hamstring stretch seated x30 sec Leg press seat 5 DL 17 plates 3 x 8 SL heel raise 2x10 R&L Forward lunge 3x15' with two 5# weights Deadlift 20# KB 3x10 Modalities: Vaso Lt knee x 10 min low pressure 34  degrees  [facility] Adult PT Treatment:                                                DATE: 08/28/22 Therapeutic Exercise: Nustep L6 x 5 min Leg press seat 5 DL 16 plates 2 x 10, SL 10 plates 2 x 8 R&L SL heel raise 2x10 R&L Sled push and pull 50# 3x15' Forward lunge 3x15' with two 5# weights Hip 3 way on slider x 10 bilat Modalities: Vaso Lt knee x 10 min low pressure 34degrees   [facility] Adult PT Treatment:                                                DATE: 08/25/22 Therapeutic Exercise: Nustep L6 x 5 min for warm up Leg press seat 6 DL 15 plates 3 x 10, SL 9 plates 3 x 10 Deadlift two 10#KB 3x 10 bilat Hip 3 way on slider x 10 bilat     PATIENT EDUCATION:  Education details: Exam findings, POC, and initial HEP Person educated: Patient Education method: Explanation, Demonstration, and Handouts Education comprehension: verbalized understanding, returned demonstration, and needs further education   HOME EXERCISE PROGRAM: Access Code: [email or web address] URL: [email or web address] Date: 08/14/2022 Prepared by: Gillermo Murdoch  Exercises - Supine Quadriceps Stretch with Strap on Table  - 1 x daily - 7 x weekly - 2 sets - 30 sec hold - Seated Hamstring Stretch  - 1 x daily - 7 x weekly - 2 sets - 30 sec hold - Hooklying Hamstring Stretch with Strap  - 1 x daily - 7 x weekly - 2 sets - 30 sec hold - Seated Figure 4 Piriformis Stretch  - 1 x daily - 7 x weekly - 2 sets - 30 sec hold - Straight Leg Raise with External Rotation  - 1 x daily - 7 x weekly - 2 sets - 10 reps - Hip Abduction with Resistance Loop  - 1 x daily - 7 x weekly - 2  sets - 10 reps - Hip Extension with Resistance Loop  - 1 x daily - 7 x weekly - 2 sets - 10 reps - Standing Hamstring Curl with Resistance  - 1 x daily - 7 x weekly - 2 sets - 10 reps - Seated Knee Extension with Resistance  - 1 x daily - 7 x weekly - 2 sets - 10 reps - Side Stepping with Resistance at Ankles  - 1 x daily - 7 x weekly - 3 sets - 10  reps - Forward Monster Walks  - 1 x daily - 7 x weekly - 3 sets - 10 reps - Backward Monster Walks  - 1 x daily - 7 x weekly - 3 sets - 10 reps - Bridge with Heels on [employer]  - 1 x daily - 7 x weekly - 2 sets - 10 reps - Bridge with Hamstring Curl on [employer]  - 1 x daily - 7 x weekly - 2 sets - 10 reps - Walking March  - 1 x daily - 7 x weekly - 3 sets - Side Stepping with Hand Floats  - 1 x daily - 7 x weekly - 3 sets - Squat  - 1 x daily - 7 x weekly - 1 sets - 10 reps - Standing 3-way leg kick holding noodle  - 1 x daily - 7 x weekly - 2 sets - 5 reps - Sports administrator with Noodle at [employer]  - 1 x daily - 7 x weekly - 1 sets - 2-3 reps - 15-20 seconds hold - Side to Side Hamstring Stretch with Noodle at [employer]  - 1 x daily - 7 x weekly - 1 sets - 2-3 reps - 15-20 seconds  hold - Jumping Jacks suspended on noodle  - 1 x daily - 7 x weekly - 3 sets - Seated Straddle on Flotation Forward Breast Stroke Arms and Bicycle Legs  - 1 x daily - 7 x weekly - 3 sets - Farmer's Carry with Kettlebells  - 1 x daily - 7 x weekly - 3 sets - 10 reps - Sit to Stand  - 2 x daily - 7 x weekly - 1 sets - 10 reps - 3-5 sec  hold - Wall Quarter Squat  - 2 x daily - 7 x weekly - 1-2 sets - 10 reps - 10 sec  hold - Single Leg Stance  - 2 x daily - 7 x weekly - 2 sets - 3-5 reps - 20 sec hold  ASSESSMENT:  CLINICAL IMPRESSION: Continued to progress pt's strengthening with good pt tolerance. Pt notes she was able to walk 1.5 miles without knee pain; however, could feel knees later in the day.    GOALS: Goals reviewed with patient? Yes   REVISED LONG TERM GOALS: Target date: 09/15/2022   Pt will be ind with continuing and advancing HEP at home and in community Baseline: Will be going to aquatics for additional HEP Goal status: IN PROGRESS  2.  Pt will report reduction in pain by at least 50% Baseline: 20% reduction Goal status: REVISED  3.  Pt will be able to return to ambulating at least 1  mile for return to normal activities Baseline: 1/2 mile before knee agitation Goal status: REVISED  4.  Pt will demo at least 4+/5 bilat LE strength Baseline: See MMT above Goal status: REVISED  5.  Pt will have increased FOTO score to at least 71 Baseline: 52  Goal status: IN PROGRESS     PLAN: PT FREQUENCY: 2x/week  PT DURATION: 6 weeks  PLANNED INTERVENTIONS: Therapeutic exercises, Therapeutic activity, Neuromuscular re-education, Balance training, Gait training, Patient/Family education, Self Care, Joint mobilization, Aquatic Therapy, Dry Needling, Electrical stimulation, Cryotherapy, Moist heat, Taping, Ionotophoresis '4mg'$ /ml Dexamethasone, and Manual therapy  PLAN FOR NEXT SESSION: Continue lifting and strengthening exercises. Continue hip/knee strengthening. Aquatics in community    Saleemah Mollenhauer April Gordy Levan, PT, DPT 09/04/2022, 8:12 AM

## 2022-09-06 ENCOUNTER — Ambulatory Visit: Payer: BC Managed Care – PPO | Admitting: Bariatrics

## 2022-09-06 ENCOUNTER — Encounter: Payer: Self-pay | Admitting: Bariatrics

## 2022-09-06 VITALS — BP 113/77 | HR 76 | Temp 98.1°F | Ht 66.0 in | Wt 299.0 lb

## 2022-09-06 DIAGNOSIS — Z6841 Body Mass Index (BMI) 40.0 and over, adult: Secondary | ICD-10-CM

## 2022-09-06 DIAGNOSIS — F5089 Other specified eating disorder: Secondary | ICD-10-CM

## 2022-09-06 DIAGNOSIS — Z9884 Bariatric surgery status: Secondary | ICD-10-CM | POA: Diagnosis not present

## 2022-09-06 DIAGNOSIS — E669 Obesity, unspecified: Secondary | ICD-10-CM | POA: Diagnosis not present

## 2022-09-06 NOTE — Progress Notes (Signed)
Chief Complaint:   OBESITY Lisa Tran is here to discuss her progress with her obesity treatment plan along with follow-up of her obesity related diagnoses. Lisa Tran is on the Category 3 Plan and states she is following her eating plan approximately 70% of the time. Lisa Tran states she is doing weight training, swimming, and biking for 30-60 minutes 3-4 times per week.  Today's visit was #: 3 Starting weight: 306 lbs Starting date: 07/25/2022 Today's weight: 297 lbs Today's date: 08/23/2022 Total lbs lost to date: 9 Total lbs lost since last in-office visit: 5  Interim History: Lisa Tran is down 5 lbs since her last visit.   Subjective:   1. H/O gastric bypass Lisa Tran has been eating more protein.   2. Other disorder of eating Lisa Tran notes some stress and emotional eating.  Assessment/Plan:   1. H/O gastric bypass Lisa Tran will continue to work on increasing her protein intake.   2. Other disorder of eating Handouts on trigger for emotional eating, and emotional hunger versus physical hunger were given today.   3. Obesity, Current BMI 48.0 Lisa Tran is currently in the action stage of change. As such, her goal is to continue with weight loss efforts. She has agreed to the Category 3 Plan and keeping a food journal and adhering to recommended goals of 1200-1400 calories and 90-120 grams of protein.   She will keep her protein and water intake high.  She will adhere closely to the plan.  Strategies for the holidays were given.  Exercise goals: As is.   Behavioral modification strategies: increasing lean protein intake, decreasing simple carbohydrates, increasing vegetables, increasing water intake, decreasing eating out, no skipping meals, meal planning and cooking strategies, keeping healthy foods in the home, holiday eating strategies , and planning for success.  Lisa Tran has agreed to follow-up with our clinic in 2 weeks. She was informed of the importance of frequent follow-up  visits to maximize her success with intensive lifestyle modifications for her multiple health conditions.   Objective:   Blood pressure 105/72, pulse 85, temperature 97.7 F (36.5 C), height '5\' 6"'$  (1.676 m), weight 297 lb (134.7 kg), SpO2 100 %. Body mass index is 47.94 kg/m.  General: Cooperative, alert, well developed, in no acute distress. HEENT: Conjunctivae and lids unremarkable. Cardiovascular: Regular rhythm.  Lungs: Normal work of breathing. Neurologic: No focal deficits.   Lab Results  Component Value Date   CREATININE 0.75 07/25/2022   BUN 14 07/25/2022   NA 138 07/25/2022   K 4.1 07/25/2022   CL 100 07/25/2022   CO2 25 07/25/2022   Lab Results  Component Value Date   ALT 35 (H) 07/25/2022   AST 34 07/25/2022   ALKPHOS 66 07/25/2022   BILITOT 0.4 07/25/2022   Lab Results  Component Value Date   HGBA1C 5.4 07/25/2022   HGBA1C 5.4 02/09/2022   Lab Results  Component Value Date   INSULIN 3.8 07/25/2022   Lab Results  Component Value Date   TSH 2.270 07/25/2022   Lab Results  Component Value Date   CHOL 169 07/25/2022   HDL 89 07/25/2022   LDLCALC 67 07/25/2022   TRIG 69 07/25/2022   CHOLHDL 2.0 02/09/2022   Lab Results  Component Value Date   VD25OH 72.3 07/25/2022   VD25OH 72 02/09/2022   VD25OH 43 09/27/2020   Lab Results  Component Value Date   WBC 5.1 02/09/2022   HGB 13.9 02/09/2022   HCT 41.1 02/09/2022   MCV 89.9 02/09/2022  PLT 352 02/09/2022   Lab Results  Component Value Date   IRON 115 02/09/2022   TIBC 334 02/09/2022   FERRITIN 259 (H) 07/25/2022   Attestation Statements:   Reviewed by clinician on day of visit: allergies, medications, problem list, medical history, surgical history, family history, social history, and previous encounter notes.   Wilhemena Durie, am acting as Location manager for [employer], DO.  I have reviewed the above documentation for accuracy and completeness, and I agree with the above. Jearld Lesch, DO

## 2022-09-08 ENCOUNTER — Ambulatory Visit: Payer: BC Managed Care – PPO | Attending: Family Medicine | Admitting: Physical Therapy

## 2022-09-08 ENCOUNTER — Encounter: Payer: Self-pay | Admitting: Physical Therapy

## 2022-09-08 DIAGNOSIS — M6281 Muscle weakness (generalized): Secondary | ICD-10-CM | POA: Diagnosis not present

## 2022-09-08 DIAGNOSIS — R262 Difficulty in walking, not elsewhere classified: Secondary | ICD-10-CM | POA: Insufficient documentation

## 2022-09-08 DIAGNOSIS — M170 Bilateral primary osteoarthritis of knee: Secondary | ICD-10-CM | POA: Insufficient documentation

## 2022-09-08 DIAGNOSIS — R2689 Other abnormalities of gait and mobility: Secondary | ICD-10-CM | POA: Insufficient documentation

## 2022-09-08 NOTE — Therapy (Signed)
OUTPATIENT PHYSICAL THERAPY TREATMENT   Patient Name: Lisa Tran MRN: [id number] DOB:19-Apr-1976, 46 y.o., female Today's Date: 09/08/2022   PT End of Session - 09/08/22 0801     Visit Number 19    Number of Visits 24    Date for PT Re-Evaluation 09/15/22    Authorization Type BCBS    PT Start Time 0802    PT Stop Time 0845    PT Time Calculation (min) 43 min    Activity Tolerance Patient tolerated treatment well    Behavior During Therapy WFL for tasks assessed/performed                Past Medical History:  Diagnosis Date   Allergy    Anemia    Anxiety    Asthma    Back pain    Food allergy    Osteoarthritis    Swelling of lower extremity    Vitamin D deficiency    Past Surgical History:  Procedure Laterality Date   COSMETIC SURGERY     GASTRIC BYPASS  2003   KNEE SURGERY  2014   Patient Active Problem List   Diagnosis Date Noted   Other specified eating disorder 09/06/2022   SOB (shortness of breath) on exertion 07/25/2022   Vitamin D deficiency 07/25/2022   Health care maintenance 07/25/2022   Elevated glucose 07/25/2022   Eating disorder 07/25/2022   Other fatigue 07/12/2022   Class 3 severe obesity with serious comorbidity and body mass index (BMI) of 45.0 to 49.9 in adult ([place]) 07/12/2022   Primary osteoarthritis of both knees 06/21/2022   Family history of colon cancer 09/23/2020   Fibroids 10/15/2017   Thrombophlebitis of superficial veins of left lower extremity 07/21/2017   Cervical paraspinal muscle spasm 10/05/2015   Iron deficiency 03/16/2015   Other specified postprocedural states 02/25/2015   Morbid obesity ([place]) 02/25/2015   H/O gastric bypass 02/25/2015    PCP: Samuel Bouche, NP  REFERRING PROVIDER: Rosemarie Ax, MD   REFERRING DIAG: M17.0 (ICD-10-CM) - Primary osteoarthritis of both knees   THERAPY DIAG:  Primary osteoarthritis of both knees  Muscle weakness (generalized)  Difficulty in walking, not elsewhere  classified  Other abnormalities of gait and mobility  Rationale for Evaluation and Treatment Rehabilitation  ONSET DATE: Labor Day weekend  SUBJECTIVE:   SUBJECTIVE STATEMENT: Pt reports doing well.   PERTINENT HISTORY: Has a history of knee surgery on the right side.   PAIN:  Are you having pain? Yes: NPRS scale: 3/10 Pain location: bilat knee Pain description: "achey" Aggravating factors: Increased weight bearing; after sitting too long and first standing up Relieving factors: No weight    PATIENT GOALS Return back to walking   OBJECTIVE:   FROM EVAL  PATIENT SURVEYS:  FOTO 52; predicted 71  MUSCLE LENGTH: Hamstrings: Right 80 deg; Left 45 deg  LOWER EXTREMITY ROM:  Active ROM Right eval Left eval  Hip flexion    Hip extension    Hip abduction    Hip adduction    Hip internal rotation    Hip external rotation    Knee flexion 105 105  Knee extension 0 0  Ankle dorsiflexion    Ankle plantarflexion    Ankle inversion    Ankle eversion     (Blank rows = not tested)  LOWER EXTREMITY MMT:  MMT Right eval Left eval Right 10/27 Left 10/27  Hip flexion 4+ 4+ 5 4+  Hip extension 4- 4- 5 4  Hip abduction  3+ 3 5 4+  Hip adduction 3 3 4+ 4  Hip internal rotation      Hip external rotation      Knee flexion 4+ 4+ 5 5  Knee extension 5 4+ 5 4+  Ankle dorsiflexion      Ankle plantarflexion      Ankle inversion      Ankle eversion       (Blank rows = not tested)  FUNCTIONAL TESTS:  N/a    TODAY'S TREATMENT: [facility] Adult PT Treatment:                                                DATE: 09/08/22 Therapeutic Exercise: Nustep L6 x 5 min Figure 4 stretch 2x 30 sec Hamstring stretch 2x 30 sec R&L Gastroc stretch x 30 sec R&L Leg press 190# 3x10 Deadlift 25# 3x10 at pulleys Lunge 15# KB 3x15' Sled push and pull 70# 3x15' Quad stretch x30 sec R&L   [facility] Adult PT Treatment:                                                DATE: 09/04/22 Therapeutic  Exercise: Nustep L6 x 5 min Figure 4 stretch seated x30 sec Hamstring stretch seated x30 sec Leg press seat 5 DL 17 plates 3 x 8 SL heel raise 2x10 R&L Forward lunge 3x15' with two 5# weights Deadlift 20# KB 3x10 Modalities: Vaso Lt knee x 10 min low pressure 34 degrees    PATIENT EDUCATION:  Education details: Exam findings, POC, and initial HEP Person educated: Patient Education method: Explanation, Demonstration, and Handouts Education comprehension: verbalized understanding, returned demonstration, and needs further education   HOME EXERCISE PROGRAM: Access Code: [email or web address] URL: [email or web address] Date: 08/14/2022 Prepared by: Gillermo Murdoch  Exercises - Supine Quadriceps Stretch with Strap on Table  - 1 x daily - 7 x weekly - 2 sets - 30 sec hold - Seated Hamstring Stretch  - 1 x daily - 7 x weekly - 2 sets - 30 sec hold - Hooklying Hamstring Stretch with Strap  - 1 x daily - 7 x weekly - 2 sets - 30 sec hold - Seated Figure 4 Piriformis Stretch  - 1 x daily - 7 x weekly - 2 sets - 30 sec hold - Straight Leg Raise with External Rotation  - 1 x daily - 7 x weekly - 2 sets - 10 reps - Hip Abduction with Resistance Loop  - 1 x daily - 7 x weekly - 2 sets - 10 reps - Hip Extension with Resistance Loop  - 1 x daily - 7 x weekly - 2 sets - 10 reps - Standing Hamstring Curl with Resistance  - 1 x daily - 7 x weekly - 2 sets - 10 reps - Seated Knee Extension with Resistance  - 1 x daily - 7 x weekly - 2 sets - 10 reps - Side Stepping with Resistance at Ankles  - 1 x daily - 7 x weekly - 3 sets - 10 reps - Forward Monster Walks  - 1 x daily - 7 x weekly - 3 sets - 10 reps - Backward Monster Walks  - 1 x daily - 7 x weekly - 3  sets - 10 reps - Bridge with Heels on [employer]  - 1 x daily - 7 x weekly - 2 sets - 10 reps - Bridge with Hamstring Curl on [employer]  - 1 x daily - 7 x weekly - 2 sets - 10 reps - Walking March  - 1 x daily - 7 x weekly - 3 sets - Side  Stepping with Hand Floats  - 1 x daily - 7 x weekly - 3 sets - Squat  - 1 x daily - 7 x weekly - 1 sets - 10 reps - Standing 3-way leg kick holding noodle  - 1 x daily - 7 x weekly - 2 sets - 5 reps - Sports administrator with Noodle at [employer]  - 1 x daily - 7 x weekly - 1 sets - 2-3 reps - 15-20 seconds hold - Side to Side Hamstring Stretch with Noodle at [employer]  - 1 x daily - 7 x weekly - 1 sets - 2-3 reps - 15-20 seconds  hold - Jumping Jacks suspended on noodle  - 1 x daily - 7 x weekly - 3 sets - Seated Straddle on Flotation Forward Breast Stroke Arms and Bicycle Legs  - 1 x daily - 7 x weekly - 3 sets - Farmer's Carry with Kettlebells  - 1 x daily - 7 x weekly - 3 sets - 10 reps - Sit to Stand  - 2 x daily - 7 x weekly - 1 sets - 10 reps - 3-5 sec  hold - Wall Quarter Squat  - 2 x daily - 7 x weekly - 1-2 sets - 10 reps - 10 sec  hold - Single Leg Stance  - 2 x daily - 7 x weekly - 2 sets - 3-5 reps - 20 sec hold  ASSESSMENT:  CLINICAL IMPRESSION: Session focused on continuing to progress pt's strengthening. Able to increase leg press weight.    GOALS: Goals reviewed with patient? Yes   REVISED LONG TERM GOALS: Target date: 09/15/2022   Pt will be ind with continuing and advancing HEP at home and in community Baseline: Will be going to aquatics for additional HEP Goal status: IN PROGRESS  2.  Pt will report reduction in pain by at least 50% Baseline: 20% reduction Goal status: REVISED  3.  Pt will be able to return to ambulating at least 1 mile for return to normal activities Baseline: 1/2 mile before knee agitation Goal status: REVISED  4.  Pt will demo at least 4+/5 bilat LE strength Baseline: See MMT above Goal status: REVISED  5.  Pt will have increased FOTO score to at least 71 Baseline: 52 Goal status: IN PROGRESS     PLAN: PT FREQUENCY: 2x/week  PT DURATION: 6 weeks  PLANNED INTERVENTIONS: Therapeutic exercises, Therapeutic activity, Neuromuscular  re-education, Balance training, Gait training, Patient/Family education, Self Care, Joint mobilization, Aquatic Therapy, Dry Needling, Electrical stimulation, Cryotherapy, Moist heat, Taping, Ionotophoresis '4mg'$ /ml Dexamethasone, and Manual therapy  PLAN FOR NEXT SESSION: Continue lifting and strengthening exercises. Continue hip/knee strengthening. Aquatics in community    Malike Foglio April Gordy Levan, PT, DPT 09/08/2022, 8:02 AM

## 2022-09-11 ENCOUNTER — Ambulatory Visit: Payer: BC Managed Care – PPO | Admitting: Physical Therapy

## 2022-09-11 ENCOUNTER — Encounter: Payer: Self-pay | Admitting: Physical Therapy

## 2022-09-11 ENCOUNTER — Telehealth (INDEPENDENT_AMBULATORY_CARE_PROVIDER_SITE_OTHER): Payer: BC Managed Care – PPO | Admitting: Psychology

## 2022-09-11 DIAGNOSIS — M170 Bilateral primary osteoarthritis of knee: Secondary | ICD-10-CM | POA: Diagnosis not present

## 2022-09-11 DIAGNOSIS — M6281 Muscle weakness (generalized): Secondary | ICD-10-CM | POA: Diagnosis not present

## 2022-09-11 DIAGNOSIS — R2689 Other abnormalities of gait and mobility: Secondary | ICD-10-CM

## 2022-09-11 DIAGNOSIS — R262 Difficulty in walking, not elsewhere classified: Secondary | ICD-10-CM | POA: Diagnosis not present

## 2022-09-11 NOTE — Therapy (Signed)
OUTPATIENT PHYSICAL THERAPY TREATMENT   Patient Name: Lisa Tran MRN: [id number] DOB:November 16, 1975, 46 y.o., female Today's Date: 09/11/2022   PT End of Session - 09/11/22 0804     Visit Number 20    Number of Visits 24    Date for PT Re-Evaluation 09/15/22    Authorization Type BCBS    PT Start Time 0804    PT Stop Time 0845    PT Time Calculation (min) 41 min    Activity Tolerance Patient tolerated treatment well    Behavior During Therapy [facility] for tasks assessed/performed               Past Medical History:  Diagnosis Date   Allergy    Anemia    Anxiety    Asthma    Back pain    Food allergy    Osteoarthritis    Swelling of lower extremity    Vitamin D deficiency    Past Surgical History:  Procedure Laterality Date   COSMETIC SURGERY     GASTRIC BYPASS  2003   KNEE SURGERY  2014   Patient Active Problem List   Diagnosis Date Noted   Other specified eating disorder 09/06/2022   SOB (shortness of breath) on exertion 07/25/2022   Vitamin D deficiency 07/25/2022   Health care maintenance 07/25/2022   Elevated glucose 07/25/2022   Eating disorder 07/25/2022   Other fatigue 07/12/2022   Class 3 severe obesity with serious comorbidity and body mass index (BMI) of 45.0 to 49.9 in adult ([place]) 07/12/2022   Primary osteoarthritis of both knees 06/21/2022   Family history of colon cancer 09/23/2020   Fibroids 10/15/2017   Thrombophlebitis of superficial veins of left lower extremity 07/21/2017   Cervical paraspinal muscle spasm 10/05/2015   Iron deficiency 03/16/2015   Other specified postprocedural states 02/25/2015   Morbid obesity ([place]) 02/25/2015   H/O gastric bypass 02/25/2015    PCP: Samuel Bouche, NP  REFERRING PROVIDER: Rosemarie Ax, MD   REFERRING DIAG: M17.0 (ICD-10-CM) - Primary osteoarthritis of both knees   THERAPY DIAG:  Primary osteoarthritis of both knees  Muscle weakness (generalized)  Difficulty in walking, not elsewhere  classified  Other abnormalities of gait and mobility  Rationale for Evaluation and Treatment Rehabilitation  ONSET DATE: Labor Day weekend  SUBJECTIVE:   SUBJECTIVE STATEMENT: Pt reports nothing new or different. Knees are feeling okay this morning.   PERTINENT HISTORY: Has a history of knee surgery on the right side.   PAIN:  Are you having pain? Yes: NPRS scale: 1/10 Pain location: bilat knee Pain description: "achey" Aggravating factors: Increased weight bearing; after sitting too long and first standing up Relieving factors: No weight    PATIENT GOALS Return back to walking   OBJECTIVE:   FROM EVAL  PATIENT SURVEYS:  FOTO 52; predicted 71  MUSCLE LENGTH: Hamstrings: Right 80 deg; Left 45 deg  LOWER EXTREMITY ROM:  Active ROM Right eval Left eval  Hip flexion    Hip extension    Hip abduction    Hip adduction    Hip internal rotation    Hip external rotation    Knee flexion 105 105  Knee extension 0 0  Ankle dorsiflexion    Ankle plantarflexion    Ankle inversion    Ankle eversion     (Blank rows = not tested)  LOWER EXTREMITY MMT:  MMT Right eval Left eval Right 10/27 Left 10/27  Hip flexion 4+ 4+ 5 4+  Hip extension  4- 4- 5 4  Hip abduction 3+ 3 5 4+  Hip adduction 3 3 4+ 4  Hip internal rotation      Hip external rotation      Knee flexion 4+ 4+ 5 5  Knee extension 5 4+ 5 4+  Ankle dorsiflexion      Ankle plantarflexion      Ankle inversion      Ankle eversion       (Blank rows = not tested)  FUNCTIONAL TESTS:  N/a    TODAY'S TREATMENT: [facility] Adult PT Treatment:                                                DATE: 09/11/22 Therapeutic Exercise: Nustep L7 x 5 min Figure 4 stretch 2x30 sec Hamstring stretch 2x30 sec DL leg press 195# 2x10; SL leg press 115# 2x10 Forward runners step up 2x10 with two 10# Kbs Trail leg lunge x10 One foot back on step x10 palloff press with 10# KB Stool scoots 2 laps around    [facility] Adult PT  Treatment:                                                DATE: 09/08/22 Therapeutic Exercise: Nustep L6 x 5 min Figure 4 stretch 2x 30 sec Hamstring stretch 2x 30 sec R&L Gastroc stretch x 30 sec R&L Leg press 190# 3x10 Deadlift 25# 3x10 at pulleys Lunge 15# KB 3x15' Sled push and pull 70# 3x15' Quad stretch x30 sec R&L     PATIENT EDUCATION:  Education details: Exam findings, POC, and initial HEP Person educated: Patient Education method: Explanation, Demonstration, and Handouts Education comprehension: verbalized understanding, returned demonstration, and needs further education   HOME EXERCISE PROGRAM: Access Code: [email or web address] URL: [email or web address] Date: 08/14/2022 Prepared by: Gillermo Murdoch  Exercises - Supine Quadriceps Stretch with Strap on Table  - 1 x daily - 7 x weekly - 2 sets - 30 sec hold - Seated Hamstring Stretch  - 1 x daily - 7 x weekly - 2 sets - 30 sec hold - Hooklying Hamstring Stretch with Strap  - 1 x daily - 7 x weekly - 2 sets - 30 sec hold - Seated Figure 4 Piriformis Stretch  - 1 x daily - 7 x weekly - 2 sets - 30 sec hold - Straight Leg Raise with External Rotation  - 1 x daily - 7 x weekly - 2 sets - 10 reps - Hip Abduction with Resistance Loop  - 1 x daily - 7 x weekly - 2 sets - 10 reps - Hip Extension with Resistance Loop  - 1 x daily - 7 x weekly - 2 sets - 10 reps - Standing Hamstring Curl with Resistance  - 1 x daily - 7 x weekly - 2 sets - 10 reps - Seated Knee Extension with Resistance  - 1 x daily - 7 x weekly - 2 sets - 10 reps - Side Stepping with Resistance at Ankles  - 1 x daily - 7 x weekly - 3 sets - 10 reps - Forward Monster Walks  - 1 x daily - 7 x weekly - 3 sets - 10 reps - Backward [employer]  -  1 x daily - 7 x weekly - 3 sets - 10 reps - Bridge with Heels on [employer]  - 1 x daily - 7 x weekly - 2 sets - 10 reps - Bridge with Hamstring Curl on [employer]  - 1 x daily - 7 x weekly - 2 sets - 10 reps - Walking  March  - 1 x daily - 7 x weekly - 3 sets - Side Stepping with Hand Floats  - 1 x daily - 7 x weekly - 3 sets - Squat  - 1 x daily - 7 x weekly - 1 sets - 10 reps - Standing 3-way leg kick holding noodle  - 1 x daily - 7 x weekly - 2 sets - 5 reps - Sports administrator with Noodle at [employer]  - 1 x daily - 7 x weekly - 1 sets - 2-3 reps - 15-20 seconds hold - Side to Side Hamstring Stretch with Noodle at [employer]  - 1 x daily - 7 x weekly - 1 sets - 2-3 reps - 15-20 seconds  hold - Jumping Jacks suspended on noodle  - 1 x daily - 7 x weekly - 3 sets - Seated Straddle on Flotation Forward Breast Stroke Arms and Bicycle Legs  - 1 x daily - 7 x weekly - 3 sets - Farmer's Carry with Kettlebells  - 1 x daily - 7 x weekly - 3 sets - 10 reps - Sit to Stand  - 2 x daily - 7 x weekly - 1 sets - 10 reps - 3-5 sec  hold - Wall Quarter Squat  - 2 x daily - 7 x weekly - 1-2 sets - 10 reps - 10 sec  hold - Single Leg Stance  - 2 x daily - 7 x weekly - 2 sets - 3-5 reps - 20 sec hold  ASSESSMENT:  CLINICAL IMPRESSION: Continued progressive strengthening. Working on improving more single leg strength and stability this session.    GOALS: Goals reviewed with patient? Yes   REVISED LONG TERM GOALS: Target date: 09/15/2022   Pt will be ind with continuing and advancing HEP at home and in community Baseline: Will be going to aquatics for additional HEP Goal status: IN PROGRESS  2.  Pt will report reduction in pain by at least 50% Baseline: 20% reduction Goal status: REVISED  3.  Pt will be able to return to ambulating at least 1 mile for return to normal activities Baseline: 1/2 mile before knee agitation Goal status: REVISED  4.  Pt will demo at least 4+/5 bilat LE strength Baseline: See MMT above Goal status: REVISED  5.  Pt will have increased FOTO score to at least 71 Baseline: 52 Goal status: IN PROGRESS     PLAN: PT FREQUENCY: 2x/week  PT DURATION: 6 weeks  PLANNED INTERVENTIONS:  Therapeutic exercises, Therapeutic activity, Neuromuscular re-education, Balance training, Gait training, Patient/Family education, Self Care, Joint mobilization, Aquatic Therapy, Dry Needling, Electrical stimulation, Cryotherapy, Moist heat, Taping, Ionotophoresis '4mg'$ /ml Dexamethasone, and Manual therapy  PLAN FOR NEXT SESSION: Continue lifting and strengthening exercises. Continue hip/knee strengthening. Aquatics in community    Yessika Otte April Gordy Levan, PT, DPT 09/11/2022, 8:04 AM

## 2022-09-15 ENCOUNTER — Ambulatory Visit: Payer: BC Managed Care – PPO | Admitting: Physical Therapy

## 2022-09-15 ENCOUNTER — Encounter: Payer: Self-pay | Admitting: Physical Therapy

## 2022-09-15 DIAGNOSIS — R2689 Other abnormalities of gait and mobility: Secondary | ICD-10-CM

## 2022-09-15 DIAGNOSIS — M170 Bilateral primary osteoarthritis of knee: Secondary | ICD-10-CM

## 2022-09-15 DIAGNOSIS — M6281 Muscle weakness (generalized): Secondary | ICD-10-CM

## 2022-09-15 DIAGNOSIS — R262 Difficulty in walking, not elsewhere classified: Secondary | ICD-10-CM

## 2022-09-15 NOTE — Therapy (Addendum)
OUTPATIENT PHYSICAL THERAPY TREATMENT AND DISCHARGE  Patient Name: Lisa Tran MRN: [id number] DOB:October 29, 1975, 46 y.o., female Today's Date: 09/15/2022   PT End of Session - 09/15/22 0801     Visit Number 21    Number of Visits 24    Date for PT Re-Evaluation 09/15/22    PT Start Time 0802    Activity Tolerance Patient tolerated treatment well    Behavior During Therapy [facility] for tasks assessed/performed            PHYSICAL THERAPY DISCHARGE SUMMARY  Visits from Start of Care: 21  Current functional level related to goals / functional outcomes: See below   Remaining deficits: See below   Education / Equipment: See below   Patient agrees to discharge. Patient goals were met. Patient is being discharged due to meeting the stated rehab goals.    Past Medical History:  Diagnosis Date   Allergy    Anemia    Anxiety    Asthma    Back pain    Food allergy    Osteoarthritis    Swelling of lower extremity    Vitamin D deficiency    Past Surgical History:  Procedure Laterality Date   COSMETIC SURGERY     GASTRIC BYPASS  2003   KNEE SURGERY  2014   Patient Active Problem List   Diagnosis Date Noted   Other specified eating disorder 09/06/2022   SOB (shortness of breath) on exertion 07/25/2022   Vitamin D deficiency 07/25/2022   Health care maintenance 07/25/2022   Elevated glucose 07/25/2022   Eating disorder 07/25/2022   Other fatigue 07/12/2022   Class 3 severe obesity with serious comorbidity and body mass index (BMI) of 45.0 to 49.9 in adult ([place]) 07/12/2022   Primary osteoarthritis of both knees 06/21/2022   Family history of colon cancer 09/23/2020   Fibroids 10/15/2017   Thrombophlebitis of superficial veins of left lower extremity 07/21/2017   Cervical paraspinal muscle spasm 10/05/2015   Iron deficiency 03/16/2015   Other specified postprocedural states 02/25/2015   Morbid obesity ([place]) 02/25/2015   H/O gastric bypass 02/25/2015    PCP:  Samuel Bouche, NP  REFERRING PROVIDER: Rosemarie Ax, MD   REFERRING DIAG: M17.0 (ICD-10-CM) - Primary osteoarthritis of both knees   THERAPY DIAG:  Primary osteoarthritis of both knees  Muscle weakness (generalized)  Difficulty in walking, not elsewhere classified  Other abnormalities of gait and mobility  Rationale for Evaluation and Treatment Rehabilitation  ONSET DATE: Labor Day weekend  SUBJECTIVE:   SUBJECTIVE STATEMENT: Pt reports she feels she has made great gains with PT. Discussed doing a check in every 3 weeks for 2 more visits.   PERTINENT HISTORY: Has a history of knee surgery on the right side.   PAIN:  Are you having pain? Yes: NPRS scale: 1/10 Pain location: bilat knee Pain description: "achey" Aggravating factors: Increased weight bearing; after sitting too long and first standing up Relieving factors: No weight    PATIENT GOALS Return back to walking   OBJECTIVE:   FROM EVAL  PATIENT SURVEYS:  FOTO 52; predicted 71 FOTO 64 (09/15/22)  MUSCLE LENGTH: Hamstrings: Right 80 deg; Left 45 deg  LOWER EXTREMITY ROM:  Active ROM Right eval Left eval  Hip flexion    Hip extension    Hip abduction    Hip adduction    Hip internal rotation    Hip external rotation    Knee flexion 105 105  Knee extension 0 0  Ankle dorsiflexion    Ankle plantarflexion    Ankle inversion    Ankle eversion     (Blank rows = not tested)  LOWER EXTREMITY MMT:  MMT Right eval Left eval Right 10/27 Left 10/27 Right 12/8 Left 12/8  Hip flexion 4+ 4+ 5 4+ 5 5  Hip extension 4- 4- _0 Hip abduction 3+ 3 5 4+ 5 5  Hip adduction 3 3 4+ 4    Hip internal rotation        Hip external rotation        Knee flexion 4+ 4+ _1 Knee extension 5 4+ 5 4+ 5 5  Ankle dorsiflexion        Ankle plantarflexion        Ankle inversion        Ankle eversion         (Blank rows = not tested)  FUNCTIONAL TESTS:  N/a   TODAY'S TREATMENT: [facility] Adult PT  Treatment:                                                DATE: 09/15/22 Therapeutic Exercise: Nustep L7 x 5 min Figure 4 stretch 2x30 sec Hamstring stretch 2x30 sec DL leg press 220# 2x10 3 way hip at pulleys 10# 2x10 each R&L    [facility] Adult PT Treatment:                                                DATE: 09/11/22 Therapeutic Exercise: Nustep L7 x 5 min Figure 4 stretch 2x30 sec Hamstring stretch 2x30 sec DL leg press 195# 2x10; SL leg press 115# 2x10 Forward runners step up 2x10 with two 10# Kbs Trail leg lunge x10 One foot back on step x10 palloff press with 10# KB Stool scoots 2 laps around    [facility] Adult PT Treatment:                                                DATE: 09/08/22 Therapeutic Exercise: Nustep L6 x 5 min Figure 4 stretch 2x 30 sec Hamstring stretch 2x 30 sec R&L Gastroc stretch x 30 sec R&L Leg press 190# 3x10 Deadlift 25# 3x10 at pulleys Lunge 15# KB 3x15' Sled push and pull 70# 3x15' Quad stretch x30 sec R&L     PATIENT EDUCATION:  Education details: Exam findings, POC, and initial HEP Person educated: Patient Education method: Explanation, Demonstration, and Handouts Education comprehension: verbalized understanding, returned demonstration, and needs further education   HOME EXERCISE PROGRAM: Access Code: [email or web address] URL: [email or web address] Date: 08/14/2022 Prepared by: Gillermo Murdoch  Exercises - Supine Quadriceps Stretch with Strap on Table  - 1 x daily - 7 x weekly - 2 sets - 30 sec hold - Seated Hamstring Stretch  - 1 x daily - 7 x weekly - 2 sets - 30 sec hold - Hooklying Hamstring Stretch with Strap  - 1 x daily - 7 x weekly - 2 sets - 30 sec hold - Seated Figure 4 Piriformis Stretch  - 1 x daily -  7 x weekly - 2 sets - 30 sec hold - Straight Leg Raise with External Rotation  - 1 x daily - 7 x weekly - 2 sets - 10 reps - Hip Abduction with Resistance Loop  - 1 x daily - 7 x weekly - 2 sets - 10 reps - Hip Extension with  Resistance Loop  - 1 x daily - 7 x weekly - 2 sets - 10 reps - Standing Hamstring Curl with Resistance  - 1 x daily - 7 x weekly - 2 sets - 10 reps - Seated Knee Extension with Resistance  - 1 x daily - 7 x weekly - 2 sets - 10 reps - Side Stepping with Resistance at Ankles  - 1 x daily - 7 x weekly - 3 sets - 10 reps - Forward Monster Walks  - 1 x daily - 7 x weekly - 3 sets - 10 reps - Backward Monster Walks  - 1 x daily - 7 x weekly - 3 sets - 10 reps - Bridge with Heels on [employer]  - 1 x daily - 7 x weekly - 2 sets - 10 reps - Bridge with Hamstring Curl on [employer]  - 1 x daily - 7 x weekly - 2 sets - 10 reps - Walking March  - 1 x daily - 7 x weekly - 3 sets - Side Stepping with Hand Floats  - 1 x daily - 7 x weekly - 3 sets - Squat  - 1 x daily - 7 x weekly - 1 sets - 10 reps - Standing 3-way leg kick holding noodle  - 1 x daily - 7 x weekly - 2 sets - 5 reps - Sports administrator with Noodle at [employer]  - 1 x daily - 7 x weekly - 1 sets - 2-3 reps - 15-20 seconds hold - Side to Side Hamstring Stretch with Noodle at [employer]  - 1 x daily - 7 x weekly - 1 sets - 2-3 reps - 15-20 seconds  hold - Jumping Jacks suspended on noodle  - 1 x daily - 7 x weekly - 3 sets - Seated Straddle on Flotation Forward Breast Stroke Arms and Bicycle Legs  - 1 x daily - 7 x weekly - 3 sets - Farmer's Carry with Kettlebells  - 1 x daily - 7 x weekly - 3 sets - 10 reps - Sit to Stand  - 2 x daily - 7 x weekly - 1 sets - 10 reps - 3-5 sec  hold - Wall Quarter Squat  - 2 x daily - 7 x weekly - 1-2 sets - 10 reps - 10 sec  hold - Single Leg Stance  - 2 x daily - 7 x weekly - 2 sets - 3-5 reps - 20 sec hold  ASSESSMENT:  CLINICAL IMPRESSION: Session focused on rechecking pt's goals. Pt has met 4/5 goals. Has not yet met her FOTO goal. Discussed PT check ins every 3 weeks and continue progressions from there. Pt is demonstrating good gains in strength but is still not strong enough to tolerate plyometrics and  running without pain. Pt would benefit from continued PT to work on these deficits. Plan to progress her towards light hopping and improve power in bilat LEs on next treatment.    GOALS: Goals reviewed with patient? Yes   LONG TERM GOALS: Target date: 09/15/2022   Pt will be ind with continuing and advancing HEP at home and in  community Baseline: Will be going to aquatics for additional HEP Goal status: MET  2.  Pt will report reduction in pain by at least 50% Baseline: 20% reduction Goal status: MET  3.  Pt will be able to return to ambulating at least 1 mile for return to normal activities Baseline: 2 miles Goal status: MET  4.  Pt will demo at least 4+/5 bilat LE strength Baseline: See MMT above Goal status: MET  5.  Pt will have increased FOTO score to at least 71 Baseline: 64 (09/15/22) Goal status: IN PROGRESS   REVISED LONG TERM GOALS: Target date: 11/10/2022   Pt will be able to tolerate hopping x 10 to demo improved power in bilat LEs for transition to running Baseline: unable to perform without pain Goal status: NEW  2. Pt will have increased FOTO score to at least 71 Baseline: 64 (09/15/22) Goal status: IN PROGRESS  3. Pt will have increased FOTO score to at least 71 Baseline: 64 (09/15/22) Goal status: IN PROGRESS   PLAN: PT FREQUENCY: 2x/week  PT DURATION: 6 weeks  PLANNED INTERVENTIONS: Therapeutic exercises, Therapeutic activity, Neuromuscular re-education, Balance training, Gait training, Patient/Family education, Self Care, Joint mobilization, Aquatic Therapy, Dry Needling, Electrical stimulation, Cryotherapy, Moist heat, Taping, Ionotophoresis 62m/ml Dexamethasone, and Manual therapy  PLAN FOR NEXT SESSION: Initiate power movements and plyo if tolerated.     Virgilio Broadhead April Ma L Marcanthony Sleight, PT, DPT 09/15/2022, 8:13 AM

## 2022-09-18 ENCOUNTER — Ambulatory Visit: Payer: BC Managed Care – PPO | Admitting: Family Medicine

## 2022-09-18 ENCOUNTER — Encounter: Payer: Self-pay | Admitting: Family Medicine

## 2022-09-18 ENCOUNTER — Ambulatory Visit: Payer: BC Managed Care – PPO | Admitting: Physical Therapy

## 2022-09-18 VITALS — BP 128/72 | Ht 66.0 in | Wt 299.0 lb

## 2022-09-18 DIAGNOSIS — M170 Bilateral primary osteoarthritis of knee: Secondary | ICD-10-CM | POA: Diagnosis not present

## 2022-09-18 MED ORDER — DICLOFENAC SODIUM 2 % EX SOLN: 2.0000 | Freq: Two times a day (BID) | CUTANEOUS | 11 refills | Status: DC

## 2022-09-18 NOTE — Progress Notes (Signed)
  Lisa Tran - 46 y.o. female MRN [id number]  Date of birth: 07-25-1976  SUBJECTIVE:  Including CC & ROS.  No chief complaint on file.   Lisa Tran is a 46 y.o. female that is following up for her bilateral knee pain.  She has done well with physical therapy.   Review of Systems See HPI   HISTORY: Past Medical, Surgical, Social, and Family History Reviewed & Updated per EMR.   Pertinent Historical Findings include:  Past Medical History:  Diagnosis Date   Allergy    Anemia    Anxiety    Asthma    Back pain    Food allergy    Osteoarthritis    Swelling of lower extremity    Vitamin D deficiency     Past Surgical History:  Procedure Laterality Date   COSMETIC SURGERY     GASTRIC BYPASS  2003   KNEE SURGERY  2014     PHYSICAL EXAM:  VS: BP 128/72   Ht '5\' 6"'$  (1.676 m)   Wt 299 lb (135.6 kg)   BMI 48.26 kg/m  Physical Exam Gen: NAD, alert, cooperative with exam, well-appearing MSK:  Neurovascularly intact       ASSESSMENT & PLAN:   Primary osteoarthritis of both knees Has received improvement with PT. Wants to continue to build strength.  - counseled on home exercise therapy and supportive care - pennsaid  - pursue custom orthotics

## 2022-09-18 NOTE — Assessment & Plan Note (Signed)
Has received improvement with PT. Wants to continue to build strength.  - counseled on home exercise therapy and supportive care - pennsaid  - pursue custom orthotics

## 2022-09-18 NOTE — Patient Instructions (Signed)
Good to see you Please use ice as needed  Please continue the exercises  I have sent in the Pennsaid  We can try custom orthotics   Please send me a message in MyChart with any questions or updates.  Please see me back in 6-8 weeks.   --Dr. Raeford Razor

## 2022-09-20 ENCOUNTER — Encounter: Payer: Self-pay | Admitting: Bariatrics

## 2022-09-20 NOTE — Progress Notes (Signed)
   Chief Complaint:   OBESITY Lisa Tran is here to discuss her progress with her obesity treatment plan along with follow-up of her obesity related diagnoses. Lisa Tran is on the Category 3 Plan and keeping a food journal and adhering to recommended goals of 1200-1400 calories and 90-120 grams of protein and states she is following her eating plan approximately 50% of the time. Lisa Tran states she is walking for 30 minutes 2 times per week.  Today's visit was #: 4 Starting weight: 306 lbs Starting date: 07/25/2022 Today's weight: 299 lbs Today's date: 09/06/2022 Total lbs lost to date: 7 Total lbs lost since last in-office visit: 0  Interim History: Lisa Tran is up 2 lbs since her last visit. She is back on track.   Subjective:   1. H/O gastric bypass Esbeydi is able to eat all foods.   2. Other specified eating disorder Lisa Tran is not on medications currently. She has met with Dr. Barker.   Assessment/Plan:   1. H/O gastric bypass Lisa Tran will try to increase her protein intake.   2. Other specified eating disorder We will continue to follow-up with the patient.   3. Obesity, Current BMI 48.4 O[place]via is currently in the action stage of change. As such, her goal is to continue with weight loss efforts. She has agreed to the Category 3 Plan and keeping a food journal and adhering to recommended goals of 1500 calories and 90 grams of protein.   Meal planning and intentional eating were discussed. She will continue to monitor. We discussed GLP-1 and handout was given.   Exercise goals: As is.   Behavioral modification strategies: increasing lean protein intake, decreasing simple carbohydrates, increasing vegetables, increasing water intake, decreasing eating out, no skipping meals, meal planning and cooking strategies, and keeping healthy foods in the home.  Lisa Tran has agreed to follow-up with our clinic in 2 to 3 weeks. She was informed of the importance of frequent follow-up  visits to maximize her success with intensive lifestyle modifications for her multiple health conditions.   Objective:   Blood pressure 113/77, pulse 76, temperature 98.1 F (36.7 C), height 5' 6" (1.676 m), weight 299 lb (135.6 kg), SpO2 100 %. Body mass index is 48.26 kg/m.  General: Cooperative, alert, well developed, in no acute distress. HEENT: Conjunctivae and lids unremarkable. Cardiovascular: Regular rhythm.  Lungs: Normal work of breathing. Neurologic: No focal deficits.   Lab Results  Component Value Date   CREATININE 0.75 07/25/2022   BUN 14 07/25/2022   NA 138 07/25/2022   K 4.1 07/25/2022   CL 100 07/25/2022   CO2 25 07/25/2022   Lab Results  Component Value Date   ALT 35 (H) 07/25/2022   AST 34 07/25/2022   ALKPHOS 66 07/25/2022   BILITOT 0.4 07/25/2022   Lab Results  Component Value Date   HGBA1C 5.4 07/25/2022   HGBA1C 5.4 02/09/2022   Lab Results  Component Value Date   INSULIN 3.8 07/25/2022   Lab Results  Component Value Date   TSH 2.270 07/25/2022   Lab Results  Component Value Date   CHOL 169 07/25/2022   HDL 89 07/25/2022   LDLCALC 67 07/25/2022   TRIG 69 07/25/2022   CHOLHDL 2.0 02/09/2022   Lab Results  Component Value Date   VD25OH 72.3 07/25/2022   VD25OH 72 02/09/2022   VD25OH 43 09/27/2020   Lab Results  Component Value Date   WBC 5.1 02/09/2022   HGB 13.9 02/09/2022   HCT 41.1   02/09/2022   MCV 89.9 02/09/2022   PLT 352 02/09/2022   Lab Results  Component Value Date   IRON 115 02/09/2022   TIBC 334 02/09/2022   FERRITIN 259 (H) 07/25/2022   Attestation Statements:   Reviewed by clinician on day of visit: allergies, medications, problem list, medical history, surgical history, family history, social history, and previous encounter notes.   I, Sharon Martin, am acting as transcriptionist for Angel Brown, DO.  I have reviewed the above documentation for accuracy and completeness, and I agree with the above. -   Angel Brown, DO   

## 2022-09-21 ENCOUNTER — Encounter: Payer: BC Managed Care – PPO | Admitting: Physical Therapy

## 2022-09-22 ENCOUNTER — Encounter: Payer: BC Managed Care – PPO | Admitting: Physical Therapy

## 2022-09-25 ENCOUNTER — Encounter: Payer: BC Managed Care – PPO | Admitting: Physical Therapy

## 2022-09-27 ENCOUNTER — Encounter: Payer: Self-pay | Admitting: Nurse Practitioner

## 2022-09-27 ENCOUNTER — Ambulatory Visit: Payer: BC Managed Care – PPO | Admitting: Nurse Practitioner

## 2022-09-27 VITALS — BP 133/84 | HR 76 | Temp 97.8°F | Ht 66.0 in | Wt 299.0 lb

## 2022-09-27 DIAGNOSIS — E669 Obesity, unspecified: Secondary | ICD-10-CM

## 2022-09-27 DIAGNOSIS — E611 Iron deficiency: Secondary | ICD-10-CM | POA: Diagnosis not present

## 2022-09-27 DIAGNOSIS — Z6841 Body Mass Index (BMI) 40.0 and over, adult: Secondary | ICD-10-CM | POA: Diagnosis not present

## 2022-09-28 ENCOUNTER — Encounter: Payer: Self-pay | Admitting: Nurse Practitioner

## 2022-09-29 ENCOUNTER — Encounter: Payer: BC Managed Care – PPO | Admitting: Physical Therapy

## 2022-10-11 ENCOUNTER — Ambulatory Visit: Payer: BC Managed Care – PPO | Admitting: Bariatrics

## 2022-10-17 NOTE — Progress Notes (Signed)
Chief Complaint:   OBESITY Lisa Tran is here to discuss her progress with her obesity treatment plan along with follow-up of her obesity related diagnoses. Lisa Tran is on keeping a food journal and adhering to recommended goals of 1500 calories and 90 grams of protein and states she is following her eating plan approximately 0% of the time. Lisa Tran states she is weight training and doing cardio 30-60 minutes 2-3 times per week.  Today's visit was #: 5 Starting weight: 306 lbs Starting date: 07/25/2022 Today's weight: 299 lbs Today's date: 09/27/2022 Total lbs lost to date: 7 lbs Total lbs lost since last in-office visit: 0  Interim History: Lisa Tran has not been able to track or follow plan due to her work schedule.  Has not been able to meal prep.  She has been following [facility].  Focusing more on resistance training.  Subjective:   1. Iron deficiency Lisa Tran is taking iron daily.  History of bariatric surgery and anemia due to fibroids.  Seeing GYN, has IUD.  Assessment/Plan:   1. Iron deficiency Will continue to monitor.  Will check iron at next visit.  2. Obesity, Current BMI 48.3 Lisa Tran is currently in the action stage of change. As such, her goal is to continue with weight loss efforts. She has agreed to keeping a food journal and adhering to recommended goals of 1500 calories and 100+ grams of protein.   Exercise goals: As is.  Discussed [email or web address]. Will consider Zebound or Wegovy at next visit.  Behavioral modification strategies: increasing lean protein intake, increasing water intake, and holiday eating strategies .  Lisa Tran has agreed to follow-up with our clinic in 3 weeks. She was informed of the importance of frequent follow-up visits to maximize her success with intensive lifestyle modifications for her multiple health conditions.   Objective:   Blood pressure 133/84, pulse 76, temperature 97.8 F (36.6 C), temperature source Oral, height '5\' 6"'$  (1.676 m),  weight 299 lb (135.6 kg), SpO2 100 %. Body mass index is 48.26 kg/m.  General: Cooperative, alert, well developed, in no acute distress. HEENT: Conjunctivae and lids unremarkable. Cardiovascular: Regular rhythm.  Lungs: Normal work of breathing. Neurologic: No focal deficits.   Lab Results  Component Value Date   CREATININE 0.75 07/25/2022   BUN 14 07/25/2022   NA 138 07/25/2022   K 4.1 07/25/2022   CL 100 07/25/2022   CO2 25 07/25/2022   Lab Results  Component Value Date   ALT 35 (H) 07/25/2022   AST 34 07/25/2022   ALKPHOS 66 07/25/2022   BILITOT 0.4 07/25/2022   Lab Results  Component Value Date   HGBA1C 5.4 07/25/2022   HGBA1C 5.4 02/09/2022   Lab Results  Component Value Date   INSULIN 3.8 07/25/2022   Lab Results  Component Value Date   TSH 2.270 07/25/2022   Lab Results  Component Value Date   CHOL 169 07/25/2022   HDL 89 07/25/2022   LDLCALC 67 07/25/2022   TRIG 69 07/25/2022   CHOLHDL 2.0 02/09/2022   Lab Results  Component Value Date   VD25OH 72.3 07/25/2022   VD25OH 72 02/09/2022   VD25OH 43 09/27/2020   Lab Results  Component Value Date   WBC 5.1 02/09/2022   HGB 13.9 02/09/2022   HCT 41.1 02/09/2022   MCV 89.9 02/09/2022   PLT 352 02/09/2022   Lab Results  Component Value Date   IRON 115 02/09/2022   TIBC 334 02/09/2022   FERRITIN 259 (H) 07/25/2022  Attestation Statements:   Reviewed by clinician on day of visit: allergies, medications, problem list, medical history, surgical history, family history, social history, and previous encounter notes.  Time spent on visit including pre-visit chart review and post-visit care and charting was 30 minutes.   I, Brendell Tyus, RMA, am acting as transcriptionist for Everardo Pacific, FNP.  I have reviewed the above documentation for accuracy and completeness, and I agree with the above. Everardo Pacific, FNP

## 2022-10-18 ENCOUNTER — Encounter: Payer: Self-pay | Admitting: Nurse Practitioner

## 2022-10-18 ENCOUNTER — Encounter: Payer: Self-pay | Admitting: Professional

## 2022-10-18 ENCOUNTER — Ambulatory Visit: Payer: 59 | Admitting: Nurse Practitioner

## 2022-10-18 ENCOUNTER — Ambulatory Visit (INDEPENDENT_AMBULATORY_CARE_PROVIDER_SITE_OTHER): Payer: 59 | Admitting: Professional

## 2022-10-18 VITALS — BP 138/72 | HR 78 | Temp 98.0°F | Ht 66.0 in | Wt 295.0 lb

## 2022-10-18 DIAGNOSIS — E669 Obesity, unspecified: Secondary | ICD-10-CM

## 2022-10-18 DIAGNOSIS — Z6841 Body Mass Index (BMI) 40.0 and over, adult: Secondary | ICD-10-CM | POA: Diagnosis not present

## 2022-10-18 DIAGNOSIS — F4320 Adjustment disorder, unspecified: Secondary | ICD-10-CM | POA: Diagnosis not present

## 2022-10-18 DIAGNOSIS — E611 Iron deficiency: Secondary | ICD-10-CM | POA: Diagnosis not present

## 2022-10-18 MED ORDER — ZEPBOUND 2.5 MG/0.5ML ~~LOC~~ SOAJ: 2.5000 mg | SUBCUTANEOUS | 0 refills | Status: DC

## 2022-10-18 NOTE — Progress Notes (Signed)
° ° ° ° ° ° ° ° ° ° ° ° ° ° °  Miki Labuda, LCMHC °

## 2022-10-18 NOTE — Patient Instructions (Addendum)
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
## 2022-10-25 NOTE — Progress Notes (Signed)
Chief Complaint:   OBESITY Lisa Tran is here to discuss her progress with her obesity treatment plan along with follow-up of her obesity related diagnoses. Lisa Tran is on keeping a food journal and adhering to recommended goals of 1500 calories and 100 protein and states she is following her eating plan approximately 30-40% of the time. Lisa Tran states she is lifting weights/biking 45-60 minutes 2-3 times per week.  Today's visit was #: 6 Starting weight: 306 lbs Starting date: 07/25/2022 Today's weight: 295 lbs Today's date: 10/18/2022 Total lbs lost to date: 11 lbs Total lbs lost since last in-office visit: 4  Interim History: Lisa Tran has done well with weight loss since her last visit.  Has not been tracking her calories/macros but has been exercising.  Her highest weight was 312 lbs.  Subjective:   1. Iron deficiency Lisa Tran is taking iron one pill a day.  Decreased from 2 pills after last visit.  Assessment/Plan:   1. Iron deficiency Will check iron level at next visit per patient request.  2. Obesity, Current BMI 47.6 Start Zepbound 2.5 mg SQ once weekly for 1 month with 0 refills.  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  Dispense: 2 mL; Refill: 0  Lisa Tran is currently in the action stage of change. As such, her goal is to continue with weight loss efforts. She has agreed to keeping a food journal and adhering to recommended goals of 1500 calories and 100 protein.   Exercise goals: As is.  Behavioral modification strategies: increasing lean protein  intake, increasing water intake, and keeping a strict food journal.  Lisa Tran has agreed to follow-up with our clinic in 4 weeks. She was informed of the importance of frequent follow-up visits to maximize her success with intensive lifestyle modifications for her multiple health conditions.   Objective:   Blood pressure 138/72, pulse 78, temperature 98 F (36.7 C), height '5\' 6"'$  (1.676 m), weight 295 lb (133.8 kg), SpO2 100 %. Body mass index is 47.61 kg/m.  General: Cooperative, alert, well developed, in no acute distress. HEENT: Conjunctivae and lids unremarkable. Cardiovascular: Regular rhythm.  Lungs: Normal work of breathing. Neurologic: No focal deficits.   Lab Results  Component Value Date   CREATININE 0.75 07/25/2022   BUN 14 07/25/2022   NA 138 07/25/2022   K 4.1 07/25/2022   CL 100 07/25/2022   CO2 25 07/25/2022   Lab Results  Component Value Date   ALT 35 (H) 07/25/2022   AST 34 07/25/2022   ALKPHOS 66 07/25/2022   BILITOT 0.4 07/25/2022   Lab Results  Component Value Date   HGBA1C 5.4 07/25/2022   HGBA1C 5.4 02/09/2022   Lab Results  Component Value Date   INSULIN 3.8 07/25/2022   Lab Results  Component Value Date   TSH 2.270 07/25/2022   Lab Results  Component Value Date   CHOL 169 07/25/2022   HDL 89 07/25/2022   LDLCALC 67 07/25/2022   TRIG 69 07/25/2022   CHOLHDL 2.0 02/09/2022   Lab Results  Component Value Date   VD25OH 72.3 07/25/2022   VD25OH 72 02/09/2022   VD25OH 43 09/27/2020  Lab Results  Component Value Date   WBC 5.1 02/09/2022   HGB 13.9 02/09/2022   HCT 41.1 02/09/2022   MCV 89.9 02/09/2022   PLT 352 02/09/2022   Lab Results  Component Value Date   IRON 115 02/09/2022   TIBC 334 02/09/2022   FERRITIN 259 (H) 07/25/2022   Attestation Statements:   Reviewed by clinician on day of visit: allergies, medications, problem list, medical history, surgical history, family history, social history, and previous encounter  notes.  I, Brendell Tyus, RMA, am acting as transcriptionist for Everardo Pacific, FNP.  I have reviewed the above documentation for accuracy and completeness, and I agree with the above. Everardo Pacific, FNP

## 2022-11-01 ENCOUNTER — Encounter: Payer: Self-pay | Admitting: Professional

## 2022-11-01 ENCOUNTER — Ambulatory Visit: Payer: 59 | Admitting: Professional

## 2022-11-01 DIAGNOSIS — F4320 Adjustment disorder, unspecified: Secondary | ICD-10-CM | POA: Diagnosis not present

## 2022-11-01 NOTE — Progress Notes (Signed)
[facility] Counselor/Therapist Progress Note  Patient ID: Lisa Tran, MRN: [id number],    Date: 11/01/2022  Time Spent: 53 minutes [id number]   Treatment Type: Individual Therapy  Risk Assessment: Danger to Self:  No Self-injurious Behavior: No Danger to Others: No  Subjective: This session was held via video teletherapy The patient consented to video teletherapy and was located at her home during this session. She is aware it is the responsibility of the patient to secure confidentiality on her end of the session. The provider was in a private home office for the duration of this session.    The patient arrived on time for her webex visit.    Issues addressed: 1-self-care -pt proud of herself for following through by going to meet an Chief Strategy Officer and have him sign a book -educated selfless vs selfish -examples -hx, what your mother taught you 2-HAALT-B Educated -examples 3-treatment planning -patient and Clinician developed treatment plan -patient fully participated in and agrees with treatment plan  Treatment Plan Problems Addressed  Anger Control Problems, Anxiety, Eating Disorders And Obesity  Goals 1. Decrease the frequency, intensity, and duration of angry thoughts, feelings, and actions and increase the ability to recognize and respectfully express frustration and resolve conflict. 2. Develop an awareness of angry thoughts, feelings, and actions, clarifying origins of, and learning alternatives to aggressive anger. 3. Develop coping strategies (e.g., feeling identification, problem-solving, assertiveness) to address emotional issues that could lead to relapse of the eating disorder. Objective Keep a journal of food consumption. Target Date: 2023-11-01 Frequency: Biweekly  Progress: 0 Modality: individual  Related Interventions Assign the client to self-monitor and record food intake (or assign "A Reality Journal: Food, Weight, Thoughts, and Feelings"  in the Adult Psychotherapy Homework Planner by [facility]); process the journal material to reinforce and facilitate motivation to change. Objective Establish regular eating patterns by eating at regular intervals and consuming optimal daily calories. Target Date: 2023-11-01 Frequency: Biweekly  Progress: 0 Modality: individual  Objective Identify and develop a list of high-risk situations for unhealthy eating or weight loss practices. Target Date: 2023-11-01 Frequency: Biweekly  Progress: 0 Modality: individual  Related Interventions Assess the nature of any external cues (e.g., persons, objects, and situations) and internal cues (thoughts, images, and impulses) that precipitate the client's uncontrolled eating and/or compensatory weight management behaviors. Direct and assist the client in construction of a hierarchy of high-risk internal and external triggers for uncontrolled eating and/or compensatory weight management behaviors. Objective State a basis for positive identity that is not based on weight and appearance but on character, traits, relationships, and intrinsic value. Target Date: 2023-11-01 Frequency: Biweekly  Progress: 0 Modality: individual  Related Interventions Assist the client in identifying a basis for self-worth apart from body image by reviewing his/her talents, successes, positive traits, importance to others, and intrinsic spiritual value. 4. Develop healthy cognitive patterns and beliefs about self that lead to positive identity and prevent a relapse of the eating disorder. 5. Enhance ability to effectively cope with the full variety of life's worries and anxieties. Objective Verbalize an understanding of the cognitive, physiological, and behavioral components of anxiety and its treatment. Target Date: 2023-11-01 Frequency: Biweekly  Progress: 0 Modality: individual  Related Interventions Discuss how treatment targets worry, anxiety symptoms, and avoidance to help  the client manage worry effectively, reduce overarousal, and eliminate unnecessary avoidance. Objective Learn and implement calming skills to reduce overall anxiety and manage anxiety symptoms. Target Date: 2023-11-01 Frequency: Biweekly  Progress: 0 Modality: individual  Related Interventions  Teach the client calming/relaxation skills (e.g., applied relaxation, progressive muscle relaxation, cue controlled relaxation; mindful breathing; biofeedback) and how to discriminate better between relaxation and tension; teach the client how to apply these skills to his/her daily life (e.g., New Directions in Progressive Muscle Relaxation by Casper Harrison, and Hazlett-Stevens; Treating Generalized Anxiety Disorder by Rygh and Amparo Bristol). Assign the client to read about progressive muscle relaxation and other calming strategies in relevant books or treatment manuals (e.g., Progressive Relaxation Training by Gwynneth Aliment and Dani Gobble; Mastery of Your Anxiety and Worry: Workbook by Beckie Busing). Assign the client homework each session in which he/she practices relaxation exercises daily, gradually applying them progressively from non-anxiety-provoking to anxiety-provoking situations; review and reinforce success while providing corrective feedback toward improvement. Objective Identify, challenge, and replace biased, fearful self-talk with positive, realistic, and empowering self-talk. Target Date: 2023-11-01 Frequency: Biweekly  Progress: 0 Modality: individual  Related Interventions Explore the client's schema and self-talk that mediate his/her fear response; assist him/her in challenging the biases; replace the distorted messages with reality-based alternatives and positive, realistic self-talk that will increase his/her self-confidence in coping with irrational fears (see Cognitive Therapy of Anxiety Disorders by Alison Stalling). Objective Identify and engage in pleasant activities on a daily  basis. Target Date: 2023-11-01 Frequency: Biweekly  Progress: 0 Modality: individual  Related Interventions Engage the client in behavioral activation, increasing the client's contact with sources of reward, identifying processes that inhibit activation, and teaching skills to solve life problems (or assign "Identify and Schedule Pleasant Activities" in the Adult Psychotherapy Homework Planner by [employer]); use behavioral techniques such as instruction, rehearsal, role-playing, role reversal as needed to assist adoption into the client's daily life; reinforce success. Objective Maintain involvement in work, family, and social activities. Target Date: 2023-11-01 Frequency: Biweekly  Progress: 0 Modality: individual  Related Interventions Support the client in following through with work, family, and social activities rather than escaping or avoiding them to focus on anxiety. Objective Describe situations, thoughts, feelings, and actions associated with anxieties and worries, their impact on functioning, and attempts to resolve them. Target Date: 2023-11-01 Frequency: Biweekly  Progress: 0 Modality: individual  Related Interventions Ask the client to describe his/her past experiences of anxiety and their impact on functioning; assess the focus, excessiveness, and uncontrollability of the worry and the type, frequency, intensity, and duration of his/her anxiety symptoms (consider using a structured interview such as The Anxiety Disorders Interview Schedule-Adult Version). 6. Increase respectful communication through the use of assertiveness and conflict resolution skills. 7. Learn and implement anger management skills to reduce the level of anger and irritability that accompanies it. Objective Work cooperatively with the therapist to identify situations, thoughts, and feelings associated with anger, angry verbal and/or behavioral actions, and the targets of those actions. Target Date: 2023-11-01  Frequency: Biweekly  Progress: 0 Modality: individual  Related Interventions As the client describes his/her history and nature of anger issues in his/her own words, thoroughly assess the various stimuli (e.g., situations, people, thoughts) that have triggered the client's anger and the thoughts, feelings, and actions that have characterized his/her anger responses. Objective Keep a daily journal of persons, situations, and other triggers of anger; record thoughts, feelings, and actions taken. Target Date: 2023-11-01 Frequency: Biweekly  Progress: 0 Modality: individual  Related Interventions Ask the client to self-monitor, keeping a daily journal in which he/she documents persons, situations, thoughts, feelings, and actions associated with moments of anger, irritation, or disappointment (or assign "Anger Journal" in the Adult Psychotherapy Homework Planner by  Jongsma); routinely process the journal toward helping the client understand his/her contributions to generating his/her anger. Assist the client in generating a list of anger triggers; process the list toward helping the client understand the causes and expressions of his/her anger. Objective Learn and implement calming and coping strategies as part of an overall approach to managing anger. Target Date: 2023-11-01 Frequency: Biweekly  Progress: 0 Modality: individual  Related Interventions Teach the client calming techniques (e.g., progressive muscle relaxation, breathing induced relaxation, calming imagery, cue-controlled relaxation, applied relaxation, mindful breathing) as part of a tailored strategy for reducing chronic and acute physiological tension that accompanies the escalation of his/her angry feelings. Objective Identify, challenge, and replace anger-inducing self-talk with self-talk that facilitates a less angry reaction. Target Date: 2023-11-01 Frequency: Biweekly  Progress: 0 Modality: individual  Related  Interventions Explore the client's self-talk that mediates his/her angry feelings and actions (e.g., demanding expectations reflected in should, must, or have-to statements); identify and challenge biases, assisting him/her in generating appraisals and self-talk that corrects for the biases and facilitates a more flexible and temperate response to frustration. Combine new self-talk with calming skills as part of a set of coping skills to manage anger. Assign the client a homework exercise in which he/she identifies angry self-talk and generates alternatives that help moderate angry reactions; review; reinforce success, providing corrective feedback toward improvement. Objective Learn and implement thought-stopping to manage intrusive unwanted thoughts that trigger anger. Target Date: 2023-11-01 Frequency: Biweekly  Progress: 0 Modality: individual  Related Interventions Assign the client to implement a "thought-stopping" technique in which he/she shouts STOP to himself/herself in his/her mind and then replaces the thought with an alternative that is calming (or assign "Making Use of the Thought-Stopping Technique" in the Adult Psychotherapy Homework Planner by [facility]); review implantation, reinforcing success and providing corrective feedback for failure. Objective Verbalize an understanding of assertive communication and how it can be used to express thoughts and feelings of anger in a controlled, respectful way. Target Date: 2023-11-01 Frequency: Biweekly  Progress: 0 Modality: individual  Related Interventions Use instruction, modeling, and/or role-playing to teach the client the distinctive elements as well as the pros and cons of assertive, unassertive (passive), and aggressive communication. 8. Learn and implement coping skills that result in a reduction of anxiety and worry, and improved daily functioning. 9. Terminate overeating and implement lifestyle changes that lead to weight loss and  improved health.  Diagnosis:Adjustment disorder, unspecified type  Plan:  -familiarize yourself with written copy of treatment plan -begin using HAALT-B for emotional eating -meet again on Monday, December 11, 2022 at [facility].

## 2022-11-01 NOTE — Progress Notes (Signed)
                Belton Peplinski, LCMHC °

## 2022-11-13 ENCOUNTER — Ambulatory Visit: Payer: 59 | Admitting: Family Medicine

## 2022-11-13 VITALS — BP 120/84 | Ht 66.0 in | Wt 298.0 lb

## 2022-11-13 DIAGNOSIS — M170 Bilateral primary osteoarthritis of knee: Secondary | ICD-10-CM

## 2022-11-13 MED ORDER — DICLOFENAC SODIUM 2 % EX SOLN: 2.0000 | Freq: Two times a day (BID) | CUTANEOUS | 2 refills | Status: DC

## 2022-11-13 NOTE — Progress Notes (Signed)
  Lisa Tran - 47 y.o. female MRN [id number]  Date of birth: Jun 17, 1976  SUBJECTIVE:  Including CC & ROS.  No chief complaint on file.   Lisa Tran is a 47 y.o. female that is following up for acute on chronic bilateral knee pain.  She has done well with physical therapy and continues to lose weight.  She has pain mostly at night but has been walking more.    Review of Systems See HPI   HISTORY: Past Medical, Surgical, Social, and Family History Reviewed & Updated per EMR.   Pertinent Historical Findings include:  Past Medical History:  Diagnosis Date   Allergy    Anemia    Anxiety    Asthma    Back pain    Food allergy    Osteoarthritis    Swelling of lower extremity    Vitamin D deficiency     Past Surgical History:  Procedure Laterality Date   COSMETIC SURGERY     GASTRIC BYPASS  2003   KNEE SURGERY  2014     PHYSICAL EXAM:  VS: BP 120/84   Ht '5\' 6"'$  (1.676 m)   Wt 298 lb (135.2 kg)   BMI 48.10 kg/m  Physical Exam Gen: NAD, alert, cooperative with exam, well-appearing MSK:  Neurovascularly intact       ASSESSMENT & PLAN:   Primary osteoarthritis of both knees Acute on chronic in nature.  Tends to have more pain at night. -Counseled on home exercise therapy and supportive care. -[place]aid. -Could consider custom orthotics

## 2022-11-13 NOTE — Assessment & Plan Note (Signed)
Acute on chronic in nature.  Tends to have more pain at night. -Counseled on home exercise therapy and supportive care. -[place]aid. -Could consider custom orthotics

## 2022-11-13 NOTE — Patient Instructions (Signed)
Good to see you Keep up the good work!  Please continue the exercises  We can pursue custom orthotics   Please send me a message in MyChart with any questions or updates.  Please see me back in 6-8 weeks.   --Dr. Raeford Razor

## 2022-11-17 ENCOUNTER — Encounter: Payer: Self-pay | Admitting: Nurse Practitioner

## 2022-11-20 ENCOUNTER — Ambulatory Visit: Payer: 59 | Admitting: Nurse Practitioner

## 2022-11-20 NOTE — Telephone Encounter (Signed)
Appt has been cancelled.  

## 2022-11-27 ENCOUNTER — Ambulatory Visit: Payer: 59 | Admitting: Professional

## 2022-12-11 ENCOUNTER — Ambulatory Visit (INDEPENDENT_AMBULATORY_CARE_PROVIDER_SITE_OTHER): Payer: 59 | Admitting: Professional

## 2022-12-11 ENCOUNTER — Encounter: Payer: Self-pay | Admitting: Professional

## 2022-12-11 DIAGNOSIS — F4320 Adjustment disorder, unspecified: Secondary | ICD-10-CM | POA: Diagnosis not present

## 2022-12-11 NOTE — Progress Notes (Signed)
[facility] Counselor/Therapist Progress Note  Patient ID: Lisa Tran, MRN: [id number],    Date: 12/11/2022  Time Spent: 55 minutes [id number]   Treatment Type: Individual Therapy  Risk Assessment: Danger to Self:  No Self-injurious Behavior: No Danger to Others: No  Subjective: This session was held via video teletherapy The patient consented to video teletherapy and was located at her home during this session. She is aware it is the responsibility of the patient to secure confidentiality on her end of the session. The provider was in a private home office for the duration of this session.    The patient arrived on time for her webex visit.    Issues addressed: 1-homework- partially completed -familiarize yourself with written copy of treatment plan -begin using HAALT-B for emotional eating   -could not find her notes but focused on why she was eating 2-grandmother and responsibility for her financial affairs and for her care a-has not been fixated on things that she cannot control -feels better as she has released it and accepted it for what it is -she caught herself getting annoyed with her grandmother cannot control her dementia b-pt admits the sacrifice that she has experienced has not been helpful -pt likes travel and experience a change of scenery c-grandmother is now at point that she is stating to forget family members 3-self-care a-pt and her spouse are going out of town for a long weekend  (Thursday-Tuesday) to celebrate her birthday -she has made arrangements for her caregivers b-enjoying book club c-had social time with friends this past weekend d-future plans for individual time and couples time e-pt admits self-care is not natural for her -coping strategies -mini-vacation -worry time 4-nutrition and exercise A-HAALT-B -focusing on making healthy choices  Treatment Plan Problems Addressed  Anger Control Problems, Anxiety, Eating Disorders  And Obesity  Goals 1. Decrease the frequency, intensity, and duration of angry thoughts, feelings, and actions and increase the ability to recognize and respectfully express frustration and resolve conflict. 2. Develop an awareness of angry thoughts, feelings, and actions, clarifying origins of, and learning alternatives to aggressive anger. 3. Develop coping strategies (e.g., feeling identification, problem-solving, assertiveness) to address emotional issues that could lead to relapse of the eating disorder. Objective Keep a journal of food consumption. Target Date: 2023-11-01 Frequency: Biweekly  Progress: 0 Modality: individual  Related Interventions Assign the client to self-monitor and record food intake (or assign "A Reality Journal: Food, Weight, Thoughts, and Feelings" in the Adult Psychotherapy Homework Planner by [facility]); process the journal material to reinforce and facilitate motivation to change. Objective Establish regular eating patterns by eating at regular intervals and consuming optimal daily calories. Target Date: 2023-11-01 Frequency: Biweekly  Progress: 0 Modality: individual  Objective Identify and develop a list of high-risk situations for unhealthy eating or weight loss practices. Target Date: 2023-11-01 Frequency: Biweekly  Progress: 0 Modality: individual  Related Interventions Assess the nature of any external cues (e.g., persons, objects, and situations) and internal cues (thoughts, images, and impulses) that precipitate the client's uncontrolled eating and/or compensatory weight management behaviors. Direct and assist the client in construction of a hierarchy of high-risk internal and external triggers for uncontrolled eating and/or compensatory weight management behaviors. Objective State a basis for positive identity that is not based on weight and appearance but on character, traits, relationships, and intrinsic value. Target Date: 2023-11-01 Frequency:  Biweekly  Progress: 0 Modality: individual  Related Interventions Assist the client in identifying a basis for self-worth apart from body image  by reviewing his/her talents, successes, positive traits, importance to others, and intrinsic spiritual value. 4. Develop healthy cognitive patterns and beliefs about self that lead to positive identity and prevent a relapse of the eating disorder. 5. Enhance ability to effectively cope with the full variety of life's worries and anxieties. Objective Verbalize an understanding of the cognitive, physiological, and behavioral components of anxiety and its treatment. Target Date: 2023-11-01 Frequency: Biweekly  Progress: 0 Modality: individual  Related Interventions Discuss how treatment targets worry, anxiety symptoms, and avoidance to help the client manage worry effectively, reduce overarousal, and eliminate unnecessary avoidance. Objective Learn and implement calming skills to reduce overall anxiety and manage anxiety symptoms. Target Date: 2023-11-01 Frequency: Biweekly  Progress: 0 Modality: individual  Related Interventions Teach the client calming/relaxation skills (e.g., applied relaxation, progressive muscle relaxation, cue controlled relaxation; mindful breathing; biofeedback) and how to discriminate better between relaxation and tension; teach the client how to apply these skills to his/her daily life (e.g., New Directions in Progressive Muscle Relaxation by Casper Harrison, and Hazlett-Stevens; Treating Generalized Anxiety Disorder by Rygh and Amparo Bristol). Assign the client to read about progressive muscle relaxation and other calming strategies in relevant books or treatment manuals (e.g., Progressive Relaxation Training by Gwynneth Aliment and Dani Gobble; Mastery of Your Anxiety and Worry: Workbook by Beckie Busing). Assign the client homework each session in which he/she practices relaxation exercises daily, gradually applying them progressively  from non-anxiety-provoking to anxiety-provoking situations; review and reinforce success while providing corrective feedback toward improvement. Objective Identify, challenge, and replace biased, fearful self-talk with positive, realistic, and empowering self-talk. Target Date: 2023-11-01 Frequency: Biweekly  Progress: 0 Modality: individual  Related Interventions Explore the client's schema and self-talk that mediate his/her fear response; assist him/her in challenging the biases; replace the distorted messages with reality-based alternatives and positive, realistic self-talk that will increase his/her self-confidence in coping with irrational fears (see Cognitive Therapy of Anxiety Disorders by Alison Stalling). Objective Identify and engage in pleasant activities on a daily basis. Target Date: 2023-11-01 Frequency: Biweekly  Progress: 0 Modality: individual  Related Interventions Engage the client in behavioral activation, increasing the client's contact with sources of reward, identifying processes that inhibit activation, and teaching skills to solve life problems (or assign "Identify and Schedule Pleasant Activities" in the Adult Psychotherapy Homework Planner by [employer]); use behavioral techniques such as instruction, rehearsal, role-playing, role reversal as needed to assist adoption into the client's daily life; reinforce success. Objective Maintain involvement in work, family, and social activities. Target Date: 2023-11-01 Frequency: Biweekly  Progress: 0 Modality: individual  Related Interventions Support the client in following through with work, family, and social activities rather than escaping or avoiding them to focus on anxiety. Objective Describe situations, thoughts, feelings, and actions associated with anxieties and worries, their impact on functioning, and attempts to resolve them. Target Date: 2023-11-01 Frequency: Biweekly  Progress: 0 Modality: individual  Related  Interventions Ask the client to describe his/her past experiences of anxiety and their impact on functioning; assess the focus, excessiveness, and uncontrollability of the worry and the type, frequency, intensity, and duration of his/her anxiety symptoms (consider using a structured interview such as The Anxiety Disorders Interview Schedule-Adult Version). 6. Increase respectful communication through the use of assertiveness and conflict resolution skills. 7. Learn and implement anger management skills to reduce the level of anger and irritability that accompanies it. Objective Work cooperatively with the therapist to identify situations, thoughts, and feelings associated with anger, angry verbal and/or behavioral actions, and the  targets of those actions. Target Date: 2023-11-01 Frequency: Biweekly  Progress: 0 Modality: individual  Related Interventions As the client describes his/her history and nature of anger issues in his/her own words, thoroughly assess the various stimuli (e.g., situations, people, thoughts) that have triggered the client's anger and the thoughts, feelings, and actions that have characterized his/her anger responses. Objective Keep a daily journal of persons, situations, and other triggers of anger; record thoughts, feelings, and actions taken. Target Date: 2023-11-01 Frequency: Biweekly  Progress: 0 Modality: individual  Related Interventions Ask the client to self-monitor, keeping a daily journal in which he/she documents persons, situations, thoughts, feelings, and actions associated with moments of anger, irritation, or disappointment (or assign "Anger Journal" in the Adult Psychotherapy Homework Planner by Bryn Gulling); routinely process the journal toward helping the client understand his/her contributions to generating his/her anger. Assist the client in generating a list of anger triggers; process the list toward helping the client understand the causes and expressions of  his/her anger. Objective Learn and implement calming and coping strategies as part of an overall approach to managing anger. Target Date: 2023-11-01 Frequency: Biweekly  Progress: 0 Modality: individual  Related Interventions Teach the client calming techniques (e.g., progressive muscle relaxation, breathing induced relaxation, calming imagery, cue-controlled relaxation, applied relaxation, mindful breathing) as part of a tailored strategy for reducing chronic and acute physiological tension that accompanies the escalation of his/her angry feelings. Objective Identify, challenge, and replace anger-inducing self-talk with self-talk that facilitates a less angry reaction. Target Date: 2023-11-01 Frequency: Biweekly  Progress: 0 Modality: individual  Related Interventions Explore the client's self-talk that mediates his/her angry feelings and actions (e.g., demanding expectations reflected in should, must, or have-to statements); identify and challenge biases, assisting him/her in generating appraisals and self-talk that corrects for the biases and facilitates a more flexible and temperate response to frustration. Combine new self-talk with calming skills as part of a set of coping skills to manage anger. Assign the client a homework exercise in which he/she identifies angry self-talk and generates alternatives that help moderate angry reactions; review; reinforce success, providing corrective feedback toward improvement. Objective Learn and implement thought-stopping to manage intrusive unwanted thoughts that trigger anger. Target Date: 2023-11-01 Frequency: Biweekly  Progress: 0 Modality: individual  Related Interventions Assign the client to implement a "thought-stopping" technique in which he/she shouts STOP to himself/herself in his/her mind and then replaces the thought with an alternative that is calming (or assign "Making Use of the Thought-Stopping Technique" in the Adult Psychotherapy  Homework Planner by [facility]); review implantation, reinforcing success and providing corrective feedback for failure. Objective Verbalize an understanding of assertive communication and how it can be used to express thoughts and feelings of anger in a controlled, respectful way. Target Date: 2023-11-01 Frequency: Biweekly  Progress: 0 Modality: individual  Related Interventions Use instruction, modeling, and/or role-playing to teach the client the distinctive elements as well as the pros and cons of assertive, unassertive (passive), and aggressive communication. 8. Learn and implement coping skills that result in a reduction of anxiety and worry, and improved daily functioning. 9. Terminate overeating and implement lifestyle changes that lead to weight loss and improved health.  Diagnosis:Adjustment disorder, unspecified type  Plan:  -meet again on Monday, December 25, 2022 at 9am.

## 2022-12-25 ENCOUNTER — Encounter: Payer: Self-pay | Admitting: Professional

## 2022-12-25 ENCOUNTER — Ambulatory Visit (INDEPENDENT_AMBULATORY_CARE_PROVIDER_SITE_OTHER): Payer: 59 | Admitting: Professional

## 2022-12-25 DIAGNOSIS — F4320 Adjustment disorder, unspecified: Secondary | ICD-10-CM

## 2022-12-25 NOTE — Progress Notes (Signed)
[facility] Counselor/Therapist Progress Note  Patient ID: Lisa Tran, MRN: [id number],    Date: 12/25/2022  Time Spent: 46 minutes [id number]   Treatment Type: Individual Therapy  Risk Assessment: Danger to Self:  No Self-injurious Behavior: No Danger to Others: No  Subjective: This session was held via video teletherapy The patient consented to video teletherapy and was located at her home during this session. She is aware it is the responsibility of the patient to secure confidentiality on her end of the session. The provider was in a private home office for the duration of this session.    The patient arrived on time for her webex visit.    Issues addressed: 1-vacation a-she and her spouse had a great time at the [place] b-it opened her eyes to how impaired her mother is -she would get an alert every night while she was away she would have to step away -she would talk to her mother and she got back in the bed most nights c-she fell one night and her aid had to go back to the house to get back in the bed d-her mother's memory is worsening and increased confusion is noticed e-she could tell that bothered her husband though he did not say anything -this past Saturday night when they were having dinner he asked if it was time to get her help on weekends -she is concerned about her mother's money being gone but knows her husband is bothered -she admitted that in her thinking she was thinking "I really can't get a break" -quality of life issue not just for mom but for she and her spouse  Treatment Plan Problems Addressed  Anger Control Problems, Anxiety, Eating Disorders And Obesity  Goals 1. Decrease the frequency, intensity, and duration of angry thoughts, feelings, and actions and increase the ability to recognize and respectfully express frustration and resolve conflict. 2. Develop an awareness of angry thoughts, feelings, and actions, clarifying  origins of, and learning alternatives to aggressive anger. 3. Develop coping strategies (e.g., feeling identification, problem-solving, assertiveness) to address emotional issues that could lead to relapse of the eating disorder. Objective Keep a journal of food consumption. Target Date: 2023-11-01 Frequency: Biweekly  Progress: 0 Modality: individual  Related Interventions Assign the client to self-monitor and record food intake (or assign "A Reality Journal: Food, Weight, Thoughts, and Feelings" in the Adult Psychotherapy Homework Planner by [facility]); process the journal material to reinforce and facilitate motivation to change. Objective Establish regular eating patterns by eating at regular intervals and consuming optimal daily calories. Target Date: 2023-11-01 Frequency: Biweekly  Progress: 0 Modality: individual  Objective Identify and develop a list of high-risk situations for unhealthy eating or weight loss practices. Target Date: 2023-11-01 Frequency: Biweekly  Progress: 0 Modality: individual  Related Interventions Assess the nature of any external cues (e.g., persons, objects, and situations) and internal cues (thoughts, images, and impulses) that precipitate the client's uncontrolled eating and/or compensatory weight management behaviors. Direct and assist the client in construction of a hierarchy of high-risk internal and external triggers for uncontrolled eating and/or compensatory weight management behaviors. Objective State a basis for positive identity that is not based on weight and appearance but on character, traits, relationships, and intrinsic value. Target Date: 2023-11-01 Frequency: Biweekly  Progress: 0 Modality: individual  Related Interventions Assist the client in identifying a basis for self-worth apart from body image by reviewing his/her talents, successes, positive traits, importance to others, and intrinsic spiritual value. 4. Develop healthy cognitive  patterns and beliefs about self that lead to positive identity and prevent a relapse of the eating disorder. 5. Enhance ability to effectively cope with the full variety of life's worries and anxieties. Objective Verbalize an understanding of the cognitive, physiological, and behavioral components of anxiety and its treatment. Target Date: 2023-11-01 Frequency: Biweekly  Progress: 0 Modality: individual  Related Interventions Discuss how treatment targets worry, anxiety symptoms, and avoidance to help the client manage worry effectively, reduce overarousal, and eliminate unnecessary avoidance. Objective Learn and implement calming skills to reduce overall anxiety and manage anxiety symptoms. Target Date: 2023-11-01 Frequency: Biweekly  Progress: 0 Modality: individual  Related Interventions Teach the client calming/relaxation skills (e.g., applied relaxation, progressive muscle relaxation, cue controlled relaxation; mindful breathing; biofeedback) and how to discriminate better between relaxation and tension; teach the client how to apply these skills to his/her daily life (e.g., New Directions in Progressive Muscle Relaxation by Casper Harrison, and Hazlett-Stevens; Treating Generalized Anxiety Disorder by Rygh and Amparo Bristol). Assign the client to read about progressive muscle relaxation and other calming strategies in relevant books or treatment manuals (e.g., Progressive Relaxation Training by Gwynneth Aliment and Dani Gobble; Mastery of Your Anxiety and Worry: Workbook by Beckie Busing). Assign the client homework each session in which he/she practices relaxation exercises daily, gradually applying them progressively from non-anxiety-provoking to anxiety-provoking situations; review and reinforce success while providing corrective feedback toward improvement. Objective Identify, challenge, and replace biased, fearful self-talk with positive, realistic, and empowering self-talk. Target Date:  2023-11-01 Frequency: Biweekly  Progress: 0 Modality: individual  Related Interventions Explore the client's schema and self-talk that mediate his/her fear response; assist him/her in challenging the biases; replace the distorted messages with reality-based alternatives and positive, realistic self-talk that will increase his/her self-confidence in coping with irrational fears (see Cognitive Therapy of Anxiety Disorders by Alison Stalling). Objective Identify and engage in pleasant activities on a daily basis. Target Date: 2023-11-01 Frequency: Biweekly  Progress: 0 Modality: individual  Related Interventions Engage the client in behavioral activation, increasing the client's contact with sources of reward, identifying processes that inhibit activation, and teaching skills to solve life problems (or assign "Identify and Schedule Pleasant Activities" in the Adult Psychotherapy Homework Planner by [employer]); use behavioral techniques such as instruction, rehearsal, role-playing, role reversal as needed to assist adoption into the client's daily life; reinforce success. Objective Maintain involvement in work, family, and social activities. Target Date: 2023-11-01 Frequency: Biweekly  Progress: 0 Modality: individual  Related Interventions Support the client in following through with work, family, and social activities rather than escaping or avoiding them to focus on anxiety. Objective Describe situations, thoughts, feelings, and actions associated with anxieties and worries, their impact on functioning, and attempts to resolve them. Target Date: 2023-11-01 Frequency: Biweekly  Progress: 0 Modality: individual  Related Interventions Ask the client to describe his/her past experiences of anxiety and their impact on functioning; assess the focus, excessiveness, and uncontrollability of the worry and the type, frequency, intensity, and duration of his/her anxiety symptoms (consider using a structured  interview such as The Anxiety Disorders Interview Schedule-Adult Version). 6. Increase respectful communication through the use of assertiveness and conflict resolution skills. 7. Learn and implement anger management skills to reduce the level of anger and irritability that accompanies it. Objective Work cooperatively with the therapist to identify situations, thoughts, and feelings associated with anger, angry verbal and/or behavioral actions, and the targets of those actions. Target Date: 2023-11-01 Frequency: Biweekly  Progress: 0 Modality: individual  Related Interventions As  the client describes his/her history and nature of anger issues in his/her own words, thoroughly assess the various stimuli (e.g., situations, people, thoughts) that have triggered the client's anger and the thoughts, feelings, and actions that have characterized his/her anger responses. Objective Keep a daily journal of persons, situations, and other triggers of anger; record thoughts, feelings, and actions taken. Target Date: 2023-11-01 Frequency: Biweekly  Progress: 0 Modality: individual  Related Interventions Ask the client to self-monitor, keeping a daily journal in which he/she documents persons, situations, thoughts, feelings, and actions associated with moments of anger, irritation, or disappointment (or assign "Anger Journal" in the Adult Psychotherapy Homework Planner by Bryn Gulling); routinely process the journal toward helping the client understand his/her contributions to generating his/her anger. Assist the client in generating a list of anger triggers; process the list toward helping the client understand the causes and expressions of his/her anger. Objective Learn and implement calming and coping strategies as part of an overall approach to managing anger. Target Date: 2023-11-01 Frequency: Biweekly  Progress: 0 Modality: individual  Related Interventions Teach the client calming techniques (e.g.,  progressive muscle relaxation, breathing induced relaxation, calming imagery, cue-controlled relaxation, applied relaxation, mindful breathing) as part of a tailored strategy for reducing chronic and acute physiological tension that accompanies the escalation of his/her angry feelings. Objective Identify, challenge, and replace anger-inducing self-talk with self-talk that facilitates a less angry reaction. Target Date: 2023-11-01 Frequency: Biweekly  Progress: 0 Modality: individual  Related Interventions Explore the client's self-talk that mediates his/her angry feelings and actions (e.g., demanding expectations reflected in should, must, or have-to statements); identify and challenge biases, assisting him/her in generating appraisals and self-talk that corrects for the biases and facilitates a more flexible and temperate response to frustration. Combine new self-talk with calming skills as part of a set of coping skills to manage anger. Assign the client a homework exercise in which he/she identifies angry self-talk and generates alternatives that help moderate angry reactions; review; reinforce success, providing corrective feedback toward improvement. Objective Learn and implement thought-stopping to manage intrusive unwanted thoughts that trigger anger. Target Date: 2023-11-01 Frequency: Biweekly  Progress: 0 Modality: individual  Related Interventions Assign the client to implement a "thought-stopping" technique in which he/she shouts STOP to himself/herself in his/her mind and then replaces the thought with an alternative that is calming (or assign "Making Use of the Thought-Stopping Technique" in the Adult Psychotherapy Homework Planner by [facility]); review implantation, reinforcing success and providing corrective feedback for failure. Objective Verbalize an understanding of assertive communication and how it can be used to express thoughts and feelings of anger in a controlled, respectful  way. Target Date: 2023-11-01 Frequency: Biweekly  Progress: 0 Modality: individual  Related Interventions Use instruction, modeling, and/or role-playing to teach the client the distinctive elements as well as the pros and cons of assertive, unassertive (passive), and aggressive communication. 8. Learn and implement coping skills that result in a reduction of anxiety and worry, and improved daily functioning. 9. Terminate overeating and implement lifestyle changes that lead to weight loss and improved health.  Diagnosis:Adjustment disorder, unspecified type  Plan:  -meet again on Monday, January 08, 2023 at [place].

## 2023-01-08 ENCOUNTER — Ambulatory Visit: Payer: 59 | Admitting: Professional

## 2023-01-22 ENCOUNTER — Encounter: Payer: Self-pay | Admitting: Professional

## 2023-01-22 ENCOUNTER — Ambulatory Visit (INDEPENDENT_AMBULATORY_CARE_PROVIDER_SITE_OTHER): Payer: 59 | Admitting: Professional

## 2023-01-22 ENCOUNTER — Encounter: Payer: Self-pay | Admitting: *Deleted

## 2023-01-22 DIAGNOSIS — F4323 Adjustment disorder with mixed anxiety and depressed mood: Secondary | ICD-10-CM

## 2023-01-22 NOTE — Progress Notes (Signed)
[facility] Counselor/Therapist Progress Note  Patient ID: Lisa Tran, MRN: [id number],    Date: 01/22/2023  Time Spent: 40 minutes [id number]   Treatment Type: Individual Therapy  Risk Assessment: Danger to Self:  No Self-injurious Behavior: No Danger to Others: No  Subjective: This session was held via video teletherapy The patient consented to video teletherapy and was located at her home during this session. She is aware it is the responsibility of the patient to secure confidentiality on her end of the session. The provider was in a private home office for the duration of this session.    The patient arrived late for her Caregility visit.    Issues addressed: 1-vacation a-missed last appointment since she was out of town for [employer] 2-grandmother a-meds changed and have caused many issues for her -pt asked PCP about rehab and he wanted to await MRI results   -PCP will recommend rehab once reviewing results -Dr. Logan Bores, neurology, said that since PCP changed they would try b-pt reaching out to him since it is a safety issue c-last night her grandmother was up all night long -her aid came in to home and her grandmother was still awake -pt had been up all night when cameras were reviewed d-pt had already ordered a lock for her door and last night was further confirmation e-pt proud of herself because she didn't freak out; she stepped back and had a moment and moved on -pt went to gym as scheduled -pt had a moment where she recognized her growth in situation 3-nursing care -evaluating need -memory care vs. Rehab -conversations with PCP to facilitate  Treatment Plan Problems Addressed  Anger Control Problems, Anxiety, Eating Disorders And Obesity  Goals 1. Decrease the frequency, intensity, and duration of angry thoughts, feelings, and actions and increase the ability to recognize and respectfully express frustration and resolve conflict. 2. Develop an  awareness of angry thoughts, feelings, and actions, clarifying origins of, and learning alternatives to aggressive anger. 3. Develop coping strategies (e.g., feeling identification, problem-solving, assertiveness) to address emotional issues that could lead to relapse of the eating disorder. Objective Keep a journal of food consumption. Target Date: 2023-11-01 Frequency: Biweekly  Progress: 0 Modality: individual  Related Interventions Assign the client to self-monitor and record food intake (or assign "A Reality Journal: Food, Weight, Thoughts, and Feelings" in the Adult Psychotherapy Homework Planner by [facility]); process the journal material to reinforce and facilitate motivation to change. Objective Establish regular eating patterns by eating at regular intervals and consuming optimal daily calories. Target Date: 2023-11-01 Frequency: Biweekly  Progress: 0 Modality: individual  Objective Identify and develop a list of high-risk situations for unhealthy eating or weight loss practices. Target Date: 2023-11-01 Frequency: Biweekly  Progress: 0 Modality: individual  Related Interventions Assess the nature of any external cues (e.g., persons, objects, and situations) and internal cues (thoughts, images, and impulses) that precipitate the client's uncontrolled eating and/or compensatory weight management behaviors. Direct and assist the client in construction of a hierarchy of high-risk internal and external triggers for uncontrolled eating and/or compensatory weight management behaviors. Objective State a basis for positive identity that is not based on weight and appearance but on character, traits, relationships, and intrinsic value. Target Date: 2023-11-01 Frequency: Biweekly  Progress: 0 Modality: individual  Related Interventions Assist the client in identifying a basis for self-worth apart from body image by reviewing his/her talents, successes, positive traits, importance to others, and  intrinsic spiritual value. 4. Develop healthy cognitive patterns and beliefs  about self that lead to positive identity and prevent a relapse of the eating disorder. 5. Enhance ability to effectively cope with the full variety of life's worries and anxieties. Objective Verbalize an understanding of the cognitive, physiological, and behavioral components of anxiety and its treatment. Target Date: 2023-11-01 Frequency: Biweekly  Progress: 0 Modality: individual  Related Interventions Discuss how treatment targets worry, anxiety symptoms, and avoidance to help the client manage worry effectively, reduce overarousal, and eliminate unnecessary avoidance. Objective Learn and implement calming skills to reduce overall anxiety and manage anxiety symptoms. Target Date: 2023-11-01 Frequency: Biweekly  Progress: 0 Modality: individual  Related Interventions Teach the client calming/relaxation skills (e.g., applied relaxation, progressive muscle relaxation, cue controlled relaxation; mindful breathing; biofeedback) and how to discriminate better between relaxation and tension; teach the client how to apply these skills to his/her daily life (e.g., New Directions in Progressive Muscle Relaxation by Marcelyn Ditty, and Hazlett-Stevens; Treating Generalized Anxiety Disorder by Rygh and Ida Rogue). Assign the client to read about progressive muscle relaxation and other calming strategies in relevant books or treatment manuals (e.g., Progressive Relaxation Training by Robb Matar and Alen Blew; Mastery of Your Anxiety and Worry: Workbook by Earlie Counts). Assign the client homework each session in which he/she practices relaxation exercises daily, gradually applying them progressively from non-anxiety-provoking to anxiety-provoking situations; review and reinforce success while providing corrective feedback toward improvement. Objective Identify, challenge, and replace biased, fearful self-talk with positive,  realistic, and empowering self-talk. Target Date: 2023-11-01 Frequency: Biweekly  Progress: 0 Modality: individual  Related Interventions Explore the client's schema and self-talk that mediate his/her fear response; assist him/her in challenging the biases; replace the distorted messages with reality-based alternatives and positive, realistic self-talk that will increase his/her self-confidence in coping with irrational fears (see Cognitive Therapy of Anxiety Disorders by Laurence Slate). Objective Identify and engage in pleasant activities on a daily basis. Target Date: 2023-11-01 Frequency: Biweekly  Progress: 0 Modality: individual  Related Interventions Engage the client in behavioral activation, increasing the client's contact with sources of reward, identifying processes that inhibit activation, and teaching skills to solve life problems (or assign "Identify and Schedule Pleasant Activities" in the Adult Psychotherapy Homework Planner by [employer]); use behavioral techniques such as instruction, rehearsal, role-playing, role reversal as needed to assist adoption into the client's daily life; reinforce success. Objective Maintain involvement in work, family, and social activities. Target Date: 2023-11-01 Frequency: Biweekly  Progress: 0 Modality: individual  Related Interventions Support the client in following through with work, family, and social activities rather than escaping or avoiding them to focus on anxiety. Objective Describe situations, thoughts, feelings, and actions associated with anxieties and worries, their impact on functioning, and attempts to resolve them. Target Date: 2023-11-01 Frequency: Biweekly  Progress: 0 Modality: individual  Related Interventions Ask the client to describe his/her past experiences of anxiety and their impact on functioning; assess the focus, excessiveness, and uncontrollability of the worry and the type, frequency, intensity, and duration of  his/her anxiety symptoms (consider using a structured interview such as The Anxiety Disorders Interview Schedule-Adult Version). 6. Increase respectful communication through the use of assertiveness and conflict resolution skills. 7. Learn and implement anger management skills to reduce the level of anger and irritability that accompanies it. Objective Work cooperatively with the therapist to identify situations, thoughts, and feelings associated with anger, angry verbal and/or behavioral actions, and the targets of those actions. Target Date: 2023-11-01 Frequency: Biweekly  Progress: 0 Modality: individual  Related Interventions As the client describes  his/her history and nature of anger issues in his/her own words, thoroughly assess the various stimuli (e.g., situations, people, thoughts) that have triggered the client's anger and the thoughts, feelings, and actions that have characterized his/her anger responses. Objective Keep a daily journal of persons, situations, and other triggers of anger; record thoughts, feelings, and actions taken. Target Date: 2023-11-01 Frequency: Biweekly  Progress: 0 Modality: individual  Related Interventions Ask the client to self-monitor, keeping a daily journal in which he/she documents persons, situations, thoughts, feelings, and actions associated with moments of anger, irritation, or disappointment (or assign "Anger Journal" in the Adult Psychotherapy Homework Planner by Stephannie Li); routinely process the journal toward helping the client understand his/her contributions to generating his/her anger. Assist the client in generating a list of anger triggers; process the list toward helping the client understand the causes and expressions of his/her anger. Objective Learn and implement calming and coping strategies as part of an overall approach to managing anger. Target Date: 2023-11-01 Frequency: Biweekly  Progress: 0 Modality: individual  Related  Interventions Teach the client calming techniques (e.g., progressive muscle relaxation, breathing induced relaxation, calming imagery, cue-controlled relaxation, applied relaxation, mindful breathing) as part of a tailored strategy for reducing chronic and acute physiological tension that accompanies the escalation of his/her angry feelings. Objective Identify, challenge, and replace anger-inducing self-talk with self-talk that facilitates a less angry reaction. Target Date: 2023-11-01 Frequency: Biweekly  Progress: 0 Modality: individual  Related Interventions Explore the client's self-talk that mediates his/her angry feelings and actions (e.g., demanding expectations reflected in should, must, or have-to statements); identify and challenge biases, assisting him/her in generating appraisals and self-talk that corrects for the biases and facilitates a more flexible and temperate response to frustration. Combine new self-talk with calming skills as part of a set of coping skills to manage anger. Assign the client a homework exercise in which he/she identifies angry self-talk and generates alternatives that help moderate angry reactions; review; reinforce success, providing corrective feedback toward improvement. Objective Learn and implement thought-stopping to manage intrusive unwanted thoughts that trigger anger. Target Date: 2023-11-01 Frequency: Biweekly  Progress: 0 Modality: individual  Related Interventions Assign the client to implement a "thought-stopping" technique in which he/she shouts STOP to himself/herself in his/her mind and then replaces the thought with an alternative that is calming (or assign "Making Use of the Thought-Stopping Technique" in the Adult Psychotherapy Homework Planner by [facility]); review implantation, reinforcing success and providing corrective feedback for failure. Objective Verbalize an understanding of assertive communication and how it can be used to express  thoughts and feelings of anger in a controlled, respectful way. Target Date: 2023-11-01 Frequency: Biweekly  Progress: 0 Modality: individual  Related Interventions Use instruction, modeling, and/or role-playing to teach the client the distinctive elements as well as the pros and cons of assertive, unassertive (passive), and aggressive communication. 8. Learn and implement coping skills that result in a reduction of anxiety and worry, and improved daily functioning. 9. Terminate overeating and implement lifestyle changes that lead to weight loss and improved health.  Diagnosis: Adjustment disorder with mixed anxiety and depressed mood  Plan:  -meet again on Monday, February 05, 2023 at 9am.

## 2023-02-05 ENCOUNTER — Ambulatory Visit: Payer: 59 | Admitting: Professional

## 2023-02-19 ENCOUNTER — Ambulatory Visit: Payer: 59 | Admitting: Professional

## 2023-04-02 ENCOUNTER — Ambulatory Visit: Payer: 59 | Admitting: Professional

## 2023-04-16 ENCOUNTER — Ambulatory Visit: Payer: 59 | Admitting: Professional

## 2023-08-17 ENCOUNTER — Other Ambulatory Visit: Payer: Self-pay

## 2023-08-17 ENCOUNTER — Ambulatory Visit
Admission: EM | Admit: 2023-08-17 | Discharge: 2023-08-17 | Disposition: A | Discharge status: Home / Self Care | Payer: 59 | Attending: Physician Assistant | Admitting: Physician Assistant

## 2023-08-17 DIAGNOSIS — J209 Acute bronchitis, unspecified: Secondary | ICD-10-CM

## 2023-08-17 MED ORDER — DOXYCYCLINE HYCLATE 100 MG PO CAPS: 100.0000 mg | ORAL_CAPSULE | Freq: Two times a day (BID) | ORAL | 0 refills | Status: DC

## 2023-08-17 NOTE — Discharge Instructions (Addendum)
Return if any problems.

## 2023-08-17 NOTE — ED Provider Notes (Signed)
[facility]    CSN: [id number] Arrival date & time: 08/17/23  1520      History   Chief Complaint No chief complaint on file.   HPI Lisa Tran is a 47 y.o. female.   Patient reports that she had a cough and congestion since the first she has tried multiple symptomatic treatments.  Patient states that over the last week she has started coughing up yellow phlegm.  Patient has a past medical history of asthma.  Patient has been using her albuterol inhaler.  Patient reports that she has had bronchitis in the past and feels the same.  Patient denies any shortness of breath.  She denies any sore throat.  Patient denies any flu or COVID exposure.     Past Medical History:  Diagnosis Date   Allergy    Anemia    Anxiety    Asthma    Back pain    Food allergy    Osteoarthritis    Swelling of lower extremity    Vitamin D deficiency     Patient Active Problem List   Diagnosis Date Noted   Adjustment disorder 10/18/2022   Other specified eating disorder 09/06/2022   SOB (shortness of breath) on exertion 07/25/2022   Vitamin D deficiency 07/25/2022   Health care maintenance 07/25/2022   Elevated glucose 07/25/2022   Eating disorder 07/25/2022   Other fatigue 07/12/2022   Class 3 severe obesity with serious comorbidity and body mass index (BMI) of 45.0 to 49.9 in adult (HCC) 07/12/2022   Primary osteoarthritis of both knees 06/21/2022   Family history of colon cancer 09/23/2020   Fibroids 10/15/2017   Thrombophlebitis of superficial veins of left lower extremity 07/21/2017   Cervical paraspinal muscle spasm 10/05/2015   Iron deficiency 03/16/2015   Other specified postprocedural states 02/25/2015   Morbid obesity (HCC) 02/25/2015   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 Authorizing Provider  doxycycline (VIBRAMYCIN) 100 MG capsule Take 1 capsule (100 mg total) by mouth 2 (two) times daily. 08/17/23  Yes Cheron Schaumann K, PA-C  albuterol (VENTOLIN HFA) 108 (90 Base) MCG/ACT inhaler Inhale 2 puffs into the lungs every 6 (six) hours as needed. For shortness of breath or wheezing 02/09/22   Christen Butter, NP  cetirizine (ZYRTEC) 10 MG tablet Take 10 mg by mouth daily.    [provider]  Cholecalciferol (VITAMIN D3) 1000 units CAPS Take by mouth.    [provider]  diclofenac Sodium (PENNSAID) 2 % SOLN Place 2 Pump (40 mg total) onto the skin 2 (two) times daily. 11/13/22   Myra Rude, MD  ferrous fumarate-iron polysaccharide complex (TANDEM) 162-115.2 MG CAPS capsule Take by mouth. 03/16/15   [provider]  Multiple  Vitamin (MULTIVITAMIN WITH MINERALS) TABS Take 1 tablet by mouth daily.    [provider]  Omega-3 Fatty Acids (FISH OIL PO) Take by mouth.    [provider]  Probiotic Product (PROBIOTIC PO) Take by mouth.    [provider]  tirzepatide (ZEPBOUND) 2.5 MG/0.5ML Pen Inject 2.5 mg into the skin once a week. 10/18/22   Irene Limbo, FNP    Family History Family History  Problem Relation Age of Onset   Asthma Mother    Sudden death Mother    Obesity Mother    Diabetes Maternal Grandfather    Prostate cancer Maternal Grandfather        prostate   Diabetes Maternal Aunt    Hypertension Maternal Aunt     Social History Social History   Tobacco Use   Smoking status: Never   Smokeless tobacco: Never  Vaping Use   Vaping status: Never Used  Substance Use Topics   Alcohol use: Yes    Comment: occ   Drug use: No     Allergies   Shellfish allergy and Fish  allergy   Review of Systems Review of Systems  All other systems reviewed and are negative.    Physical Exam Triage Vital Signs ED Triage Vitals  Encounter Vitals Group     BP 08/17/23 1605 128/86     Systolic BP Percentile --      Diastolic BP Percentile --      Pulse Rate 08/17/23 1559 80     Resp 08/17/23 1559 16     Temp 08/17/23 1559 98.4 F (36.9 C)     Temp src --      SpO2 08/17/23 1559 99 %     Weight --      Height --      Head Circumference --      Peak Flow --      Pain Score 08/17/23 1605 0     Pain Loc --      Pain Education --      Exclude from Growth Chart --    No data found.  Updated Vital Signs BP 128/86   Pulse 80   Temp 98.4 F (36.9 C)   Resp 16   SpO2 99%   Visual Acuity Right Eye Distance:   Left Eye Distance:   Bilateral Distance:    Right Eye Near:   Left Eye Near:    Bilateral Near:     Physical Exam Vitals and nursing note reviewed.  Constitutional:      Appearance: She is well-developed.  HENT:     Head: Normocephalic.  Cardiovascular:     Rate and Rhythm: Normal rate.  Pulmonary:     Effort: Pulmonary effort is normal.  Abdominal:     General: There is no distension.  Musculoskeletal:        General: Normal range of motion.     Cervical back: Normal range of motion.  Skin:    General: Skin is warm.  Neurological:     General: No focal deficit present.     Mental Status: She is alert and oriented to person, place, and time.      UC Treatments / Results  Labs (all labs ordered are listed, but only abnormal results are displayed) Labs Reviewed - No data to display  EKG   Radiology No results found.  Procedures Procedures (including critical care time)  Medications Ordered in UC Medications - No data to display  Initial  Impression / Assessment and Plan / UC Course  I have reviewed the triage vital signs and the nursing notes.  Pertinent labs & imaging results that were available during my care of  the patient were reviewed by me and considered in my medical decision making (see chart for details).     Patient has had symptoms for over 5 weeks.  Patient is advised to continue using her albuterol inhaler I will treat her with doxycycline. Final Clinical Impressions(s) / UC Diagnoses   Final diagnoses:  Acute bronchitis, unspecified organism     Discharge Instructions      Return if any problems.    ED Prescriptions     Medication Sig Dispense Auth. Provider   doxycycline (VIBRAMYCIN) 100 MG capsule Take 1 capsule (100 mg total) by mouth 2 (two) times daily. 20 capsule Elson Areas, [place]      PDMP not reviewed this encounter. An After Visit Summary was printed and given to the patient.       Elson Areas, [place] 08/17/23 1658

## 2023-08-17 NOTE — ED Triage Notes (Signed)
C/o cold symptoms x 5 weeks, thought it was allergies but it hasn't gone away. Has tried otc medications without relief. Reports mucus is now coming up yellow/green in the past 5 days.

## 2024-02-09 ENCOUNTER — Other Ambulatory Visit: Payer: Self-pay

## 2024-02-09 ENCOUNTER — Ambulatory Visit
Admission: EM | Admit: 2024-02-09 | Discharge: 2024-02-09 | Disposition: A | Discharge status: Home / Self Care | Attending: Family Medicine | Admitting: Family Medicine

## 2024-02-09 DIAGNOSIS — M545 Low back pain, unspecified: Secondary | ICD-10-CM

## 2024-02-09 MED ORDER — HYDROCODONE-ACETAMINOPHEN 5-325 MG PO TABS: 1.0000 | ORAL_TABLET | Freq: Three times a day (TID) | ORAL | 0 refills | Status: AC | PRN

## 2024-02-09 NOTE — Discharge Instructions (Addendum)
 Advised patient to take Norco as directed daily or as needed for acute right sided low back pain.  Patient advised of sedative effects of this medication.  Encouraged increase daily water intake to 64 ounces per day while taking this medication.  Advised if symptoms worsen and/or unresolved please follow-up with your PCP or here for further evaluation.

## 2024-02-09 NOTE — ED Triage Notes (Signed)
 Pt presents to uc with co of back pain on right side lower back for a few days pt thinks she may have injured it while working in the yard.

## 2024-02-09 NOTE — ED Provider Notes (Signed)
 [facility]    CSN: [id number] Arrival date & time: 02/09/24  0920      History   Chief Complaint Chief Complaint  Patient presents with   Back Pain    HPI Lisa Tran is a 48 y.o. female.   HPI Pleasant 48 year old female presents with right sided lower back pain 3-4 days.  Patient reports may have injured it while working in her yard.  PMH significant for morbid morbid/severe obesity, back pain, and swelling of the lower extremity.  Epic chart reviews reveal MRI of lumbar spine performed on 09/02/2019 revealed disc bulging L3-L4 with small extraforaminal disc protrusion on the left.  Mild spinal stenosis and mild subarticular stenosis on the left.  Past Medical History:  Diagnosis Date   Allergy    Anemia    Anxiety    Asthma    Back pain    Food allergy    Osteoarthritis    Swelling of lower extremity    Vitamin D  deficiency     Patient Active Problem List   Diagnosis Date Noted   Adjustment disorder 10/18/2022   Other specified eating disorder 09/06/2022   SOB (shortness of breath) on exertion 07/25/2022   Vitamin D  deficiency 07/25/2022   Health care maintenance 07/25/2022   Elevated glucose 07/25/2022   Eating disorder 07/25/2022   Other fatigue 07/12/2022   Class 3 severe obesity with serious comorbidity and body mass index (BMI) of 45.0 to 49.9 in adult 07/12/2022   Primary osteoarthritis of both knees 06/21/2022   Family history of colon cancer 09/23/2020   Fibroids 10/15/2017   Thrombophlebitis of superficial veins of left lower extremity 07/21/2017   Cervical paraspinal muscle spasm 10/05/2015   Iron deficiency 03/16/2015   Other specified postprocedural states 02/25/2015   Morbid obesity (HCC) 02/25/2015   H/O gastric bypass 02/25/2015    Past Surgical History:  Procedure Laterality Date   COSMETIC SURGERY     GASTRIC BYPASS  2003   KNEE SURGERY  2014    OB History     Gravida  1   Para      Term      Preterm      AB   1   Living         SAB      IAB      Ectopic      Multiple      Live Births               Home Medications    Prior to Admission medications   Medication Sig Start Date End Date Taking? Authorizing Provider  HYDROcodone-acetaminophen  (NORCO/VICODIN) 5-325 MG tablet Take 1 tablet by mouth every 8 (eight) hours as needed for up to 5 days. 02/09/24 02/14/24 Yes Leonides Ramp, FNP  albuterol  (VENTOLIN  HFA) 108 (90 Base) MCG/ACT inhaler Inhale 2 puffs into the lungs every 6 (six) hours as needed. For shortness of breath or wheezing 02/09/22   Cherre Cornish, NP  cetirizine (ZYRTEC) 10 MG tablet Take 10 mg by mouth daily.    [provider]  Cholecalciferol (VITAMIN D3) 1000 units CAPS Take by mouth.    [provider]  diclofenac  Sodium (PENNSAID ) 2 % SOLN Place 2 Pump (40 mg total) onto the skin 2 (two) times daily. 11/13/22   Margaree Shark, MD  doxycycline  (VIBRAMYCIN ) 100 MG capsule Take 1 capsule (100 mg total) by mouth 2 (two) times daily. 08/17/23   Sofia, Leslie K, PA-C  ferrous  fumarate-iron polysaccharide complex (TANDEM) 162-115.2 MG CAPS capsule Take by mouth. 03/16/15   [provider]  Multiple Vitamin (MULTIVITAMIN WITH MINERALS) TABS Take 1 tablet by mouth daily.    [provider]  Omega-3 Fatty Acids (FISH OIL PO) Take by mouth.    [provider]  Probiotic Product (PROBIOTIC PO) Take by mouth.    [provider]  tirzepatide  (ZEPBOUND ) 2.5 MG/0.5ML Pen Inject 2.5 mg into the skin once a week. 10/18/22   Helane Lloyd, FNP    Family History Family History  Problem Relation Age of Onset   Asthma Mother    Sudden death Mother    Obesity Mother    Diabetes Maternal Grandfather    Prostate cancer Maternal Grandfather        prostate   Diabetes Maternal Aunt    Hypertension Maternal Aunt     Social History Social History   Tobacco Use   Smoking status: Never   Smokeless tobacco: Never  Vaping Use    Vaping status: Never Used  Substance Use Topics   Alcohol use: Yes    Comment: occ   Drug use: No     Allergies   Shellfish allergy and Fish allergy   Review of Systems Review of Systems  Musculoskeletal:  Positive for back pain.     Physical Exam Triage Vital Signs ED Triage Vitals  Encounter Vitals Group     BP      Systolic BP Percentile      Diastolic BP Percentile      Pulse      Resp      Temp      Temp src      SpO2      Weight      Height      Head Circumference      Peak Flow      Pain Score      Pain Loc      Pain Education      Exclude from Growth Chart    No data found.  Updated Vital Signs BP 115/78   Pulse 80   Temp 98.3 F (36.8 C)   Resp 16   SpO2 98%    Physical Exam Vitals and nursing note reviewed.  Constitutional:      Appearance: Normal appearance. She is obese.  HENT:     Head: Normocephalic and atraumatic.     Mouth/Throat:     Mouth: Mucous membranes are moist.     Pharynx: Oropharynx is clear.  Eyes:     Extraocular Movements: Extraocular movements intact.     Conjunctiva/sclera: Conjunctivae normal.     Pupils: Pupils are equal, round, and reactive to light.  Cardiovascular:     Rate and Rhythm: Normal rate and regular rhythm.     Pulses: Normal pulses.     Heart sounds: Normal heart sounds.  Pulmonary:     Effort: Pulmonary effort is normal.     Breath sounds: Normal breath sounds. No wheezing, rhonchi or rales.  Musculoskeletal:        General: Normal range of motion.     Cervical back: Normal range of motion and neck supple.     Comments: Lumbar spine (right sided inferior aspect: TTP over spinous processes paraspinous muscles and inferior spinal erectors, patient reporting moderate to severe pain with movement to that area  Skin:    General: Skin is warm and dry.  Neurological:     General: No focal  deficit present.     Mental Status: She is alert and oriented to person, place, and time. Mental status is at  baseline.  Psychiatric:        Mood and Affect: Mood normal.        Behavior: Behavior normal.      UC Treatments / Results  Labs (all labs ordered are listed, but only abnormal results are displayed) Labs Reviewed - No data to display  EKG   Radiology No results found.  Procedures Procedures (including critical care time)  Medications Ordered in UC Medications - No data to display  Initial Impression / Assessment and Plan / UC Course  I have reviewed the triage vital signs and the nursing notes.  Pertinent labs & imaging results that were available during my care of the patient were reviewed by me and considered in my medical decision making (see chart for details).     MDM: 1.  Acute right sided low back pain without sciatica-Rx'd Norco 5/325 mg tablet: Take 1 tablet every 8 hours, as needed for right-sided low back pain. Advised patient to take Norco as directed daily or as needed for acute right sided low back pain.  Patient advised of sedative effects of this medication.  Encouraged increase daily water intake to 64 ounces per day while taking this medication.  Advised if symptoms worsen and/or unresolved please follow-up with your PCP or here for further evaluation.  Patient discharged home, hemodynamically stable. Final Clinical Impressions(s) / UC Diagnoses   Final diagnoses:  Acute right-sided low back pain without sciatica     Discharge Instructions      Advised patient to take Norco as directed daily or as needed for acute right sided low back pain.  Patient advised of sedative effects of this medication.  Encouraged increase daily water intake to 64 ounces per day while taking this medication.  Advised if symptoms worsen and/or unresolved please follow-up with your PCP or here for further evaluation.     ED Prescriptions     Medication Sig Dispense Auth. Provider   HYDROcodone-acetaminophen  (NORCO/VICODIN) 5-325 MG tablet Take 1 tablet by mouth every 8  (eight) hours as needed for up to 5 days. 15 tablet Raliegh Scobie, FNP      I have reviewed the PDMP during this encounter.   Leonides Ramp, FNP 02/09/24 1012

## 2024-03-26 ENCOUNTER — Other Ambulatory Visit: Payer: Self-pay | Admitting: Medical-Surgical

## 2024-03-26 DIAGNOSIS — Z1231 Encounter for screening mammogram for malignant neoplasm of breast: Secondary | ICD-10-CM

## 2024-04-03 ENCOUNTER — Encounter

## 2024-04-03 DIAGNOSIS — Z1231 Encounter for screening mammogram for malignant neoplasm of breast: Secondary | ICD-10-CM

## 2024-04-07 ENCOUNTER — Other Ambulatory Visit (HOSPITAL_COMMUNITY)
Admission: RE | Admit: 2024-04-07 | Discharge: 2024-04-07 | Disposition: A | Discharge status: Home / Self Care | Source: Ambulatory Visit | Attending: Obstetrics & Gynecology | Admitting: Obstetrics & Gynecology

## 2024-04-07 ENCOUNTER — Ambulatory Visit: Admitting: Obstetrics & Gynecology

## 2024-04-07 ENCOUNTER — Encounter: Payer: Self-pay | Admitting: Obstetrics & Gynecology

## 2024-04-07 VITALS — BP 132/83 | HR 71 | Ht 65.5 in | Wt 316.0 lb

## 2024-04-07 DIAGNOSIS — N951 Menopausal and female climacteric states: Secondary | ICD-10-CM

## 2024-04-07 DIAGNOSIS — Z0000 Encounter for general adult medical examination without abnormal findings: Secondary | ICD-10-CM | POA: Diagnosis present

## 2024-04-07 DIAGNOSIS — Z01419 Encounter for gynecological examination (general) (routine) without abnormal findings: Secondary | ICD-10-CM

## 2024-04-07 NOTE — Progress Notes (Signed)
   Subjective:     Lisa Tran is a 48 y.o. female here for a routine exam.  Current complaints:   Thinks pre menopause. Irregular at times now but lasting 6 days. Says bleeds heavy. Having hot flashes both day and night. Brain fog and difficulty articulating. Irreg sleep patterns. Vaginal dryness.Wants STI testing today, swab only. PHQ -6  GAD -5   Gynecologic History Patient's last menstrual period was 03/03/2024 (exact date). Contraception: IUD Last pap smear (date and result):04/2022 WNL Last mammogram (date and result):6/2-23 WNL Last colon screening (date and result):N/A Brush:Daily Floss:Floss Seatbelts: Yes Sunscreen: Yes   Obstetric History OB History  Gravida Para Term Preterm AB Living  1    1   SAB IAB Ectopic Multiple Live Births          # Outcome Date GA Lbr Len/2nd Weight Sex Type Anes PTL Lv  1 AB              The following portions of the patient's history were reviewed and updated as appropriate: allergies, current medications, past family history, past medical history, past social history, past surgical history, and problem list.  Review of Systems Pertinent items noted in HPI and remainder of comprehensive ROS otherwise negative.    Objective:   Vitals:   04/07/24 1555  BP: 132/83  Pulse: 71  Weight: (!) 316 lb (143.3 kg)  Height: 5' 5.5 (1.664 m)   Vitals:  WNL General appearance: alert, cooperative and no distress  HEENT: Normocephalic, without obvious abnormality, atraumatic Eyes: negative Throat: lips, mucosa, and tongue normal; teeth and gums normal  Respiratory: Clear to auscultation bilaterally  CV: Regular rate and rhythm  Breasts:  Normal appearance, no masses or tenderness, no nipple retraction or dimpling  GI: Soft, non-tender; bowel sounds normal; no masses,  no organomegaly  GU: External Genitalia:  Tanner V, no lesion Urethra:  No prolapse   Vagina: Pink, normal rugae, no blood or discharge  Cervix: No CMT, no lesion, IUD  strings not seen; ?tip felt??  Uterus:  Normal size and contour, non tender  Adnexa: Normal, no masses, non tender  Musculoskeletal: No edema, redness or tenderness in the calves or thighs  Skin: No lesions or rash  Lymphatic: Axillary adenopathy: none     Psychiatric: Normal mood and behavior      Assessment:    Healthy female exam.    Plan:    1.  Pap up to date. 2.  Yearly mammogram  3.  Referral to GI for colonoscopy  4.  Check FSH and estradiol  5.  Return to office in a couple of weeks for menopausal symptoms. 6.  Replens

## 2024-04-08 LAB — FOLLICLE STIMULATING HORMONE: FSH: 37.5 m[IU]/mL

## 2024-04-08 LAB — ESTRADIOL: Estradiol: 17.2 pg/mL

## 2024-04-09 ENCOUNTER — Ambulatory Visit: Payer: Self-pay | Admitting: Obstetrics & Gynecology

## 2024-04-09 LAB — CERVICOVAGINAL ANCILLARY ONLY
Chlamydia: NEGATIVE
Comment: NEGATIVE
Comment: NEGATIVE
Comment: NORMAL
Neisseria Gonorrhea: NEGATIVE
Trichomonas: NEGATIVE

## 2024-04-21 ENCOUNTER — Encounter: Payer: Self-pay | Admitting: Obstetrics & Gynecology

## 2024-04-21 ENCOUNTER — Ambulatory Visit: Admitting: Obstetrics & Gynecology

## 2024-04-21 VITALS — BP 118/86 | HR 70 | Ht 65.5 in | Wt 313.0 lb

## 2024-04-21 DIAGNOSIS — N951 Menopausal and female climacteric states: Secondary | ICD-10-CM

## 2024-04-21 MED ORDER — ESTRADIOL 0.025 MG/24HR TD PTTW: 1.0000 | MEDICATED_PATCH | TRANSDERMAL | 12 refills | Status: AC

## 2024-04-21 NOTE — Progress Notes (Signed)
   Subjective:    Patient ID: Lisa Tran, female    DOB: January 09, 1976, 48 y.o.   MRN: [id number]  HPI  48 yo female presents to talk about menopausal symptoms--brain fog and hot flashes/night sweats--increasing in frequency up to 3 a day.  Still has menstrual cycles--they are regular.  Patient has some vaginal dryness and dyspareunia but is better after starting Replens.  Patient given samples of Uberlube today.  Review of Systems  Constitutional:        Hot flashes and night sweats  Respiratory: Negative.    Cardiovascular: Negative.   Gastrointestinal: Negative.   Genitourinary: Negative.        Some vaginal dryness and dyspareunia that is improved with Replens.       Objective:   Physical Exam Vitals reviewed.  Constitutional:      General: She is not in acute distress.    Appearance: She is well-developed.  HENT:     Head: Normocephalic and atraumatic.  Eyes:     Conjunctiva/sclera: Conjunctivae normal.  Cardiovascular:     Rate and Rhythm: Normal rate.  Pulmonary:     Effort: Pulmonary effort is normal.  Skin:    General: Skin is warm and dry.  Neurological:     Mental Status: She is alert and oriented to person, place, and time.  Psychiatric:        Mood and Affect: Mood normal.    Vitals:   04/21/24 1035  BP: 118/86  Pulse: 70  Weight: (!) 313 lb (142 kg)  Height: 5' 5.5 (1.664 m)       Assessment & Plan:  48 year old premenopausal female with hot flashes and flashes and brain fog.  Patient has slightly elevated FSH at 37 and an estradiol  of 17.  She fits the perimenopausal picture.  She does still have monthly menses.  She has no spotting in between.  She has a Mirena  IUD.  Last ultrasound showed some fibroid tumors of the uterus and the IUD in the lower uterine segment. We discussed hormonal and nonhormonal therapies.  Patient also given up-to-date handouts on both.  At this time patient would like to try the hormone replacement therapy.  She thinks  that it will help the majority of her symptoms.  She denies history of deep vein thrombosis, pulmonary embolism or stroke.  Patient with bothersome menopausal vasomotor symptoms. Discussed lifestyle interventions such as wearing light clothing, remaining in cool environments, having fan/air conditioner in the room, avoiding hot beverages etc.  Discussed using hormone therapy and concerns about increased risk of heart disease, cerebrovascular disease, thromboembolic disease,  and breast cancer.  Also discussed other medical options such as Paxil, Effexor or Neurontin.   Also discussed alternative therapies such as herbal remedies but cautioned that most of the products contained phytoestrogens (plant estrogens) in unregulated amounts which can have the same effects on the body as the pharmaceutical estrogen preparations.  Also referred her to www.menopause.org for other alternative options.  Patient opted for transdermal estrogen therapy for now, mirena  IUD for progesterone.  RTC in 6 weeks.  27 minutes spent in the encounter including review of history and physical, ultrasound results, lab results, counseling, and documentation.

## 2024-05-08 ENCOUNTER — Ambulatory Visit

## 2024-05-08 DIAGNOSIS — Z1231 Encounter for screening mammogram for malignant neoplasm of breast: Secondary | ICD-10-CM

## 2024-05-12 ENCOUNTER — Ambulatory Visit: Payer: Self-pay | Admitting: Medical-Surgical

## 2024-05-21 ENCOUNTER — Encounter: Payer: Self-pay | Admitting: Medical-Surgical

## 2024-05-21 ENCOUNTER — Ambulatory Visit (INDEPENDENT_AMBULATORY_CARE_PROVIDER_SITE_OTHER): Admitting: Medical-Surgical

## 2024-05-21 VITALS — BP 101/70 | HR 80 | Resp 20 | Ht 65.5 in | Wt 316.6 lb

## 2024-05-21 DIAGNOSIS — Z0289 Encounter for other administrative examinations: Secondary | ICD-10-CM

## 2024-05-21 DIAGNOSIS — E611 Iron deficiency: Secondary | ICD-10-CM | POA: Diagnosis not present

## 2024-05-21 DIAGNOSIS — Z0000 Encounter for general adult medical examination without abnormal findings: Secondary | ICD-10-CM | POA: Diagnosis not present

## 2024-05-21 DIAGNOSIS — Z91013 Allergy to seafood: Secondary | ICD-10-CM

## 2024-05-21 DIAGNOSIS — E559 Vitamin D deficiency, unspecified: Secondary | ICD-10-CM

## 2024-05-21 DIAGNOSIS — Z1211 Encounter for screening for malignant neoplasm of colon: Secondary | ICD-10-CM

## 2024-05-21 DIAGNOSIS — Z9884 Bariatric surgery status: Secondary | ICD-10-CM

## 2024-05-21 MED ORDER — ALBUTEROL SULFATE HFA 108 (90 BASE) MCG/ACT IN AERS: 2.0000 | INHALATION_SPRAY | Freq: Four times a day (QID) | RESPIRATORY_TRACT | 3 refills | Status: AC | PRN

## 2024-05-21 MED ORDER — EPINEPHRINE 0.3 MG/0.3ML IJ SOAJ: 0.3000 mg | INTRAMUSCULAR | 2 refills | Status: AC | PRN

## 2024-05-21 NOTE — Patient Instructions (Signed)
 Preventive Care 50-50 Years Old, Female  Preventive care refers to lifestyle choices and visits with your health care provider that can promote health and wellness. Preventive care visits are also called wellness exams.  What can I expect for my preventive care visit?  Counseling  Your health care provider may ask you questions about your:  Medical history, including:  Past medical problems.  Family medical history.  Pregnancy history.  Current health, including:  Menstrual cycle.  Method of birth control.  Emotional well-being.  Home life and relationship well-being.  Sexual activity and sexual health.  Lifestyle, including:  Alcohol, nicotine or tobacco, and drug use.  Access to firearms.  Diet, exercise, and sleep habits.  Work and work Astronomer.  Sunscreen use.  Safety issues such as seatbelt and bike helmet use.  Physical exam  Your health care provider will check your:  Height and weight. These may be used to calculate your BMI (body mass index). BMI is a measurement that tells if you are at a healthy weight.  Waist circumference. This measures the distance around your waistline. This measurement also tells if you are at a healthy weight and may help predict your risk of certain diseases, such as type 2 diabetes and high blood pressure.  Heart rate and blood pressure.  Body temperature.  Skin for abnormal spots.  What immunizations do I need?    Vaccines are usually given at various ages, according to a schedule. Your health care provider will recommend vaccines for you based on your age, medical history, and lifestyle or other factors, such as travel or where you work.  What tests do I need?  Screening  Your health care provider may recommend screening tests for certain conditions. This may include:  Lipid and cholesterol levels.  Diabetes screening. This is done by checking your blood sugar (glucose) after you have not eaten for a while (fasting).  Pelvic exam and Pap test.  Hepatitis B test.  Hepatitis C  test.  HIV (human immunodeficiency virus) test.  STI (sexually transmitted infection) testing, if you are at risk.  Lung cancer screening.  Colorectal cancer screening.  Mammogram. Talk with your health care provider about when you should start having regular mammograms. This may depend on whether you have a family history of breast cancer.  BRCA-related cancer screening. This may be done if you have a family history of breast, ovarian, tubal, or peritoneal cancers.  Bone density scan. This is done to screen for osteoporosis.  Talk with your health care provider about your test results, treatment options, and if necessary, the need for more tests.  Follow these instructions at home:  Eating and drinking    Eat a diet that includes fresh fruits and vegetables, whole grains, lean protein, and low-fat dairy products.  Take vitamin and mineral supplements as recommended by your health care provider.  Do not drink alcohol if:  Your health care provider tells you not to drink.  You are pregnant, may be pregnant, or are planning to become pregnant.  If you drink alcohol:  Limit how much you have to 0-1 drink a day.  Know how much alcohol is in your drink. In the [place] one drink equals one 12 oz bottle of beer (355 mL), one 5 oz glass of wine (148 mL), or one 1 oz glass of hard liquor (44 mL).  Lifestyle  Brush your teeth every morning and night with fluoride toothpaste. Floss one time each day.  Exercise for at least  30 minutes 5 or more days each week.  Do not use any products that contain nicotine or tobacco. These products include cigarettes, chewing tobacco, and vaping devices, such as e-cigarettes. If you need help quitting, ask your health care provider.  Do not use drugs.  If you are sexually active, practice safe sex. Use a condom or other form of protection to prevent STIs.  If you do not wish to become pregnant, use a form of birth control. If you plan to become pregnant, see your health care provider for a  prepregnancy visit.  Take aspirin only as told by your health care provider. Make sure that you understand how much to take and what form to take. Work with your health care provider to find out whether it is safe and beneficial for you to take aspirin daily.  Find healthy ways to manage stress, such as:  Meditation, yoga, or listening to music.  Journaling.  Talking to a trusted person.  Spending time with friends and family.  Minimize exposure to UV radiation to reduce your risk of skin cancer.  Safety  Always wear your seat belt while driving or riding in a vehicle.  Do not drive:  If you have been drinking alcohol. Do not ride with someone who has been drinking.  When you are tired or distracted.  While texting.  If you have been using any mind-altering substances or drugs.  Wear a helmet and other protective equipment during sports activities.  If you have firearms in your house, make sure you follow all gun safety procedures.  Seek help if you have been physically or sexually abused.  What's next?  Visit your health care provider once a year for an annual wellness visit.  Ask your health care provider how often you should have your eyes and teeth checked.  Stay up to date on all vaccines.  This information is not intended to replace advice given to you by your health care provider. Make sure you discuss any questions you have with your health care provider.  Document Revised: 03/23/2021 Document Reviewed: 03/23/2021  Elsevier Patient Education  2024 [employer].

## 2024-05-21 NOTE — Progress Notes (Signed)
 Complete physical exam  Patient: Lisa Tran   DOB: Oct 15, 1975   48 y.o. Female  MRN: [id number]  Subjective:    Chief Complaint  Patient presents with   Annual Exam   Weight Management Screening   HORMONES   Chest Pain    Past Friday - Chest pain just breathing in Saturday - chest sore Sunday - chest a little sore Monday - Gone Has been lifting weights recently   Alopecia    Thinning on top    Lisa Tran is a 48 y.o. female who presents today for a complete physical exam. She reports consuming a general diet. Likes to lift weights for exercise. She generally feels well. She reports sleeping fairly well. She does not have additional problems to discuss today.    Most recent fall risk assessment:    02/09/2022   11:07 AM  Fall Risk   Falls in the past year? 0  Number falls in past yr: 0  Injury with Fall? 0  Risk for fall due to : No Fall Risks  Follow up Falls evaluation completed      Data saved with a previous flowsheet row definition     Most recent depression screenings:    04/07/2024    4:49 PM 07/25/2022    7:49 AM  PHQ 2/9 Scores  PHQ - 2 Score 1 4  PHQ- 9 Score 6 13    Vision:Within last year and Dental: No current dental problems and Receives regular dental care    Patient Care Team: Willo Mini, NP as PCP - General (Nurse Practitioner)   Outpatient Medications Prior to Visit  Medication Sig   cetirizine (ZYRTEC) 10 MG tablet Take 10 mg by mouth daily.   Cholecalciferol (VITAMIN D3) 1000 units CAPS Take by mouth.   diclofenac  Sodium (PENNSAID ) 2 % SOLN Place 2 Pump (40 mg total) onto the skin 2 (two) times daily.   estradiol  (VIVELLE -DOT) 0.025 MG/24HR Place 1 patch onto the skin 2 (two) times a week.   ferrous fumarate-iron polysaccharide complex (TANDEM) 162-115.2 MG CAPS capsule Take by mouth.   Multiple Vitamin (MULTIVITAMIN WITH MINERALS) TABS Take 1 tablet by mouth daily.   Omega-3 Fatty Acids (FISH OIL PO) Take by mouth.    Probiotic Product (PROBIOTIC PO) Take by mouth.   [DISCONTINUED] albuterol  (VENTOLIN  HFA) 108 (90 Base) MCG/ACT inhaler Inhale 2 puffs into the lungs every 6 (six) hours as needed. For shortness of breath or wheezing   [DISCONTINUED] doxycycline  (VIBRAMYCIN ) 100 MG capsule Take 1 capsule (100 mg total) by mouth 2 (two) times daily. (Patient not taking: Reported on 04/21/2024)   [DISCONTINUED] tirzepatide  (ZEPBOUND ) 2.5 MG/0.5ML Pen Inject 2.5 mg into the skin once a week. (Patient not taking: Reported on 04/21/2024)   Facility-Administered Medications Prior to Visit  Medication Dose Route Frequency Provider   levonorgestrel  (MIRENA ) 20 MCG/24HR IUD   Intrauterine Once Dove, Myra C, MD    Review of Systems  Constitutional:  Positive for malaise/fatigue. Negative for chills, fever and weight loss.       Hot flashes, night sweats   HENT:  Negative for congestion, ear pain, hearing loss, sinus pain and sore throat.   Eyes:  Negative for blurred vision, photophobia and pain.  Respiratory:  Positive for shortness of breath (with asthma flares) and wheezing (with asthma flares). Negative for cough.   Cardiovascular:  Positive for chest pain. Negative for palpitations and leg swelling.  Gastrointestinal:  Negative for abdominal pain, constipation, diarrhea, heartburn, nausea  and vomiting.  Genitourinary:  Negative for dysuria, frequency and urgency.  Musculoskeletal:  Negative for falls and neck pain.  Skin:  Negative for itching and rash.  Neurological:  Negative for dizziness, weakness and headaches.       Brain fog  Endo/Heme/Allergies:  Negative for polydipsia. Does not bruise/bleed easily.  Psychiatric/Behavioral:  Negative for depression, substance abuse and suicidal ideas. The patient is not nervous/anxious and does not have insomnia (disturbed by night sweats/hot flashes).      Objective:    BP 101/70 (BP Location: Left Arm, Cuff Size: Large)   Pulse 80   Resp 20   Ht 5' 5.5 (1.664 m)    Wt (!) 316 lb 9.6 oz (143.6 kg)   LMP 05/08/2024   SpO2 99%   BMI 51.88 kg/m    Physical Exam Vitals reviewed.  Constitutional:      General: She is not in acute distress.    Appearance: Normal appearance. She is well-developed. She is obese. She is not ill-appearing.  HENT:     Head: Normocephalic and atraumatic.     Right Ear: Tympanic membrane, ear canal and external ear normal. There is no impacted cerumen.     Left Ear: Tympanic membrane, ear canal and external ear normal. There is no impacted cerumen.     Nose: Nose normal. No congestion or rhinorrhea.     Mouth/Throat:     Mouth: Mucous membranes are moist.     Pharynx: No oropharyngeal exudate or posterior oropharyngeal erythema.  Eyes:     General: No scleral icterus.       Right eye: No discharge.        Left eye: No discharge.     Extraocular Movements: Extraocular movements intact.     Conjunctiva/sclera: Conjunctivae normal.     Pupils: Pupils are equal, round, and reactive to light.  Neck:     Thyroid : No thyromegaly.     Vascular: No carotid bruit or JVD.     Trachea: Trachea normal.  Cardiovascular:     Rate and Rhythm: Normal rate and regular rhythm.     Pulses: Normal pulses.     Heart sounds: Normal heart sounds. No murmur heard.    No friction rub. No gallop.  Pulmonary:     Effort: Pulmonary effort is normal. No respiratory distress.     Breath sounds: Normal breath sounds. No wheezing.  Abdominal:     General: Bowel sounds are normal. There is no distension.     Palpations: Abdomen is soft.     Tenderness: There is no abdominal tenderness. There is no guarding.  Musculoskeletal:        General: Normal range of motion.     Cervical back: Normal range of motion and neck supple.  Lymphadenopathy:     Cervical: No cervical adenopathy.  Skin:    General: Skin is warm and dry.  Neurological:     Mental Status: She is alert and oriented to person, place, and time.     Cranial Nerves: No cranial  nerve deficit.  Psychiatric:        Mood and Affect: Mood normal.        Behavior: Behavior normal.        Thought Content: Thought content normal.        Judgment: Judgment normal.    No results found for any visits on 05/21/24.     Assessment & Plan:    Routine Health Maintenance and Physical Exam  Immunization History  Administered Date(s) Administered   Influenza,inj,Quad PF,6+ Mos 10/21/2013   Tdap 09/17/2018    Health Maintenance  Topic Date Due   HIV Screening  Never done   Hepatitis C Screening  Never done   Hepatitis B Vaccines (1 of 3 - 19+ 3-dose series) Never done   Colonoscopy  Never done   Pneumococcal Vaccine: 50+ Years (1 of 1 - PCV) Never done   Zoster Vaccines- Shingrix (1 of 2) Never done   COVID-19 Vaccine (1 - 2024-25 season) 06/06/2024 (Originally 06/10/2023)   INFLUENZA VACCINE  01/06/2025 (Originally 05/09/2024)   MAMMOGRAM  05/08/2026   Cervical Cancer Screening (HPV/Pap Cotest)  04/11/2027   DTaP/Tdap/Td (2 - Td or Tdap) 09/17/2028   HPV VACCINES  Aged Out   Meningococcal B Vaccine  Aged Out    Discussed health benefits of physical activity, and encouraged her to engage in regular exercise appropriate for her age and condition.  1. Annual physical exam (Primary) Checking labs as below. UTD on preventative care. Wellness information provided with AVS. - CBC - CMP14+EGFR - Lipid panel  2. Iron deficiency Checking CBC and iron today. - CBC - Iron, TIBC and Ferritin Panel  3. Vitamin D  deficiency Checking vitamin D . - VITAMIN D  25 Hydroxy (Vit-D Deficiency, Fractures)  4. Morbid obesity (HCC) Remains interested in weight loss options.  Checking labs as below.  Referring to medical weight management as she did well with this in the past and would like to return. - Lipid panel - TSH - Hemoglobin A1c - Amb Ref to Medical Weight Management  5. H/O gastric bypass Taking vitamin B12. - Vitamin B12  6. Colon cancer screening Referral  placed by her OB/GYN however she has not heard anything yet.  Unable to determine where this was sent but we will reach out to our referral coordinators to see if they are able to provide additional information.  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  EpiPen  ordered. - EPINEPHrine  (EPIPEN  2-PAK) 0.3 mg/0.3 mL IJ SOAJ injection; Inject 0.3 mg into the muscle as needed for anaphylaxis.  Dispense: 1 each; Refill: 2   Return for chest pain follow up (EKG, CXR) at your convenience.     Marlyce Mcdougald, NP

## 2024-05-22 ENCOUNTER — Encounter: Payer: Self-pay | Admitting: Nurse Practitioner

## 2024-05-22 ENCOUNTER — Ambulatory Visit: Admitting: Nurse Practitioner

## 2024-05-22 VITALS — BP 134/78 | HR 77 | Temp 97.8°F | Ht 65.5 in | Wt 309.0 lb

## 2024-05-22 DIAGNOSIS — E559 Vitamin D deficiency, unspecified: Secondary | ICD-10-CM | POA: Diagnosis not present

## 2024-05-22 DIAGNOSIS — Z6841 Body Mass Index (BMI) 40.0 and over, adult: Secondary | ICD-10-CM | POA: Diagnosis not present

## 2024-05-22 DIAGNOSIS — Z9884 Bariatric surgery status: Secondary | ICD-10-CM

## 2024-05-22 DIAGNOSIS — E611 Iron deficiency: Secondary | ICD-10-CM | POA: Diagnosis not present

## 2024-05-22 DIAGNOSIS — E66813 Obesity, class 3: Secondary | ICD-10-CM | POA: Diagnosis not present

## 2024-05-22 LAB — IRON,TIBC AND FERRITIN PANEL
Ferritin: 152 ng/mL — ABNORMAL HIGH (ref 15–150)
Iron Saturation: 29 % (ref 15–55)
Iron: 84 ug/dL (ref 27–159)
Total Iron Binding Capacity: 288 ug/dL (ref 250–450)
UIBC: 204 ug/dL (ref 131–425)

## 2024-05-22 LAB — LIPID PANEL
Chol/HDL Ratio: 2 ratio (ref 0.0–4.4)
Cholesterol, Total: 171 mg/dL (ref 100–199)
HDL: 84 mg/dL (ref >39)
LDL Chol Calc (NIH): 78 mg/dL (ref 0–99)
Triglycerides: 44 mg/dL (ref 0–149)
VLDL Cholesterol Cal: 9 mg/dL (ref 5–40)

## 2024-05-22 LAB — CMP14+EGFR
ALT: 18 IU/L (ref 0–32)
AST: 23 IU/L (ref 0–40)
Albumin: 4.2 g/dL (ref 3.9–4.9)
Alkaline Phosphatase: 76 IU/L (ref 44–121)
BUN/Creatinine Ratio: 20 (ref 9–23)
BUN: 13 mg/dL (ref 6–24)
Bilirubin Total: 0.4 mg/dL (ref 0.0–1.2)
CO2: 24 mmol/L (ref 20–29)
Calcium: 9.3 mg/dL (ref 8.7–10.2)
Chloride: 100 mmol/L (ref 96–106)
Creatinine, Ser: 0.66 mg/dL (ref 0.57–1.00)
Globulin, Total: 2.6 g/dL (ref 1.5–4.5)
Glucose: 89 mg/dL (ref 70–99)
Potassium: 4.9 mmol/L (ref 3.5–5.2)
Sodium: 137 mmol/L (ref 134–144)
Total Protein: 6.8 g/dL (ref 6.0–8.5)
eGFR: 107 mL/min/1.73 (ref >59)

## 2024-05-22 LAB — CBC
Hematocrit: 39 % (ref 34.0–46.6)
Hemoglobin: 12.7 g/dL (ref 11.1–15.9)
MCH: 29.6 pg (ref 26.6–33.0)
MCHC: 32.6 g/dL (ref 31.5–35.7)
MCV: 91 fL (ref 79–97)
Platelets: 361 x10E3/uL (ref 150–450)
RBC: 4.29 x10E6/uL (ref 3.77–5.28)
RDW: 13.2 % (ref 11.7–15.4)
WBC: 3.5 x10E3/uL (ref 3.4–10.8)

## 2024-05-22 LAB — HEMOGLOBIN A1C
Est. average glucose Bld gHb Est-mCnc: 114 mg/dL
Hgb A1c MFr Bld: 5.6 % (ref 4.8–5.6)

## 2024-05-22 LAB — VITAMIN D 25 HYDROXY (VIT D DEFICIENCY, FRACTURES): Vit D, 25-Hydroxy: 61.4 ng/mL (ref 30.0–100.0)

## 2024-05-22 LAB — TSH: TSH: 1.71 u[IU]/mL (ref 0.450–4.500)

## 2024-05-22 LAB — VITAMIN B12: Vitamin B-12: 346 pg/mL (ref 232–1245)

## 2024-05-22 NOTE — Progress Notes (Unsigned)
 Office: [phone]  /  Fax: [phone]   Initial Visit  Lisa Tran was seen in clinic today to evaluate for obesity. She is interested in losing weight to improve overall health and reduce the risk of weight related complications. She presents today to review program treatment options, initial physical assessment, and evaluation.     She was referred by: PCP  When asked what else they would like to accomplish? She states: Adopt a healthier eating pattern and lifestyle, Improve energy levels and physical activity, Improve existing medical conditions, and Improve quality of life  She is status post ***   When asked how has your weight affected you? She states: Contributed to orthopedic problems or mobility issues, Having fatigue, and Having poor endurance  Some associated conditions: OA both knees, Vit D def  Contributing factors: family history of obesity, menopause, and sedentary job  Weight promoting medications identified: Contraceptives or hormonal therapy  Current nutrition plan: None  Current level of physical activity: None   Past medical history includes:   Past Medical History:  Diagnosis Date   Allergy    Anemia    Anxiety    Asthma    Back pain    Food allergy    Osteoarthritis    Swelling of lower extremity    Vitamin D  deficiency      Objective:   BP 134/78   Pulse 77   Temp 97.8 F (36.6 C)   Ht 5' 5.5 (1.664 m)   Wt (!) 309 lb (140.2 kg)   LMP 05/08/2024 (Approximate)   SpO2 96%   BMI 50.64 kg/m  She was weighed on the bioimpedance scale: Body mass index is 50.64 kg/m.  Peak Weight:400 lbs , Body Fat%:52%, Visceral Fat Rating:19, Weight trend over the last 12 months: Increasing  General:  Alert, oriented and cooperative. Patient is in no acute distress.  Respiratory: Normal respiratory effort, no problems with respiration noted   Gait: able to ambulate independently  Mental Status: Normal mood and affect. Normal behavior. Normal  judgment and thought content.   DIAGNOSTIC DATA REVIEWED:  BMET    Component Value Date/Time   NA 137 05/21/2024 1100   K 4.9 05/21/2024 1100   CL 100 05/21/2024 1100   CO2 24 05/21/2024 1100   GLUCOSE 89 05/21/2024 1100   GLUCOSE 82 02/09/2022 1151   BUN 13 05/21/2024 1100   CREATININE 0.66 05/21/2024 1100   CREATININE 0.75 02/09/2022 1151   CALCIUM 9.3 05/21/2024 1100   GFRNONAA 97 09/27/2020 0000   GFRAA 113 09/27/2020 0000   Lab Results  Component Value Date   HGBA1C 5.6 05/21/2024   HGBA1C 5.4 02/09/2022   Lab Results  Component Value Date   INSULIN  3.8 07/25/2022   CBC    Component Value Date/Time   WBC 3.5 05/21/2024 1100   WBC 5.1 02/09/2022 1151   RBC 4.29 05/21/2024 1100   RBC 4.57 02/09/2022 1151   HGB 12.7 05/21/2024 1100   HCT 39.0 05/21/2024 1100   PLT 361 05/21/2024 1100   MCV 91 05/21/2024 1100   MCH 29.6 05/21/2024 1100   MCH 30.4 02/09/2022 1151   MCHC 32.6 05/21/2024 1100   MCHC 33.8 02/09/2022 1151   RDW 13.2 05/21/2024 1100   Iron/TIBC/Ferritin/ %Sat    Component Value Date/Time   IRON 84 05/21/2024 1100   TIBC 288 05/21/2024 1100   FERRITIN 152 (H) 05/21/2024 1100   IRONPCTSAT 29 05/21/2024 1100   IRONPCTSAT 34 02/09/2022 1151   Lipid  Panel     Component Value Date/Time   CHOL 171 05/21/2024 1100   TRIG 44 05/21/2024 1100   HDL 84 05/21/2024 1100   CHOLHDL 2.0 05/21/2024 1100   CHOLHDL 2.0 02/09/2022 1151   VLDL 12 04/24/2017 0834   LDLCALC 78 05/21/2024 1100   LDLCALC 81 02/09/2022 1151   Hepatic Function Panel     Component Value Date/Time   PROT 6.8 05/21/2024 1100   ALBUMIN 4.2 05/21/2024 1100   AST 23 05/21/2024 1100   ALT 18 05/21/2024 1100   ALKPHOS 76 05/21/2024 1100   BILITOT 0.4 05/21/2024 1100      Component Value Date/Time   TSH 1.710 05/21/2024 1100     Assessment and Plan:   There are no diagnoses linked to this encounter.      Obesity Treatment / Action Plan:  Patient will work on garnering  support from family and friends to begin weight loss journey. Will work on eliminating or reducing the presence of highly palatable, calorie dense foods in the home. Will complete provided nutritional and psychosocial assessment questionnaire before the next appointment. Will be scheduled for indirect calorimetry to determine resting energy expenditure in a fasting state.  This will allow us  to create a reduced calorie, high-protein meal plan to promote loss of fat mass while preserving muscle mass. Counseled on the health benefits of losing 5%-15% of total body weight. Was counseled on nutritional approaches to weight loss and benefits of reducing processed foods and consuming plant-based foods and high quality protein as part of nutritional weight management. Was counseled on pharmacotherapy and role as an adjunct in weight management.   Obesity Education Performed Today:  She was weighed on the bioimpedance scale and results were discussed and documented in the synopsis.  We discussed obesity as a disease and the importance of a more detailed evaluation of all the factors contributing to the disease.  We discussed the importance of long term lifestyle changes which include nutrition, exercise and behavioral modifications as well as the importance of customizing this to her specific health and social needs.  We discussed the benefits of reaching a healthier weight to alleviate the symptoms of existing conditions and reduce the risks of the biomechanical, metabolic and psychological effects of obesity.  Weston Fulco appears to be in the action stage of change and states they are ready to start intensive lifestyle modifications and behavioral modifications.  30 minutes was spent today on this visit including the above counseling, pre-visit chart review, and post-visit documentation.  Reviewed by clinician on day of visit: allergies, medications, problem list, medical history, surgical  history, family history, social history, and previous encounter notes pertinent to obesity diagnosis.    Corean SAUNDERS Trejon Duford FNP-C

## 2024-05-23 ENCOUNTER — Ambulatory Visit: Admitting: Medical-Surgical

## 2024-05-23 ENCOUNTER — Encounter: Payer: Self-pay | Admitting: Medical-Surgical

## 2024-05-23 VITALS — BP 103/72 | HR 66 | Resp 20 | Ht 65.5 in | Wt 312.0 lb

## 2024-05-23 DIAGNOSIS — R079 Chest pain, unspecified: Secondary | ICD-10-CM

## 2024-05-23 NOTE — Progress Notes (Signed)
        Established patient visit   History of Present Illness   Discussed the use of AI scribe software for clinical note transcription with the patient, who gave verbal consent to proceed.  History of Present Illness   Lisa Tran is a 48 year old female who presents with a history of chest pain.  Chest pain - No current chest pain or soreness today - Previously experienced chest pain, now resolved - Discussed at physical appointment and here today for in office EKG and possible CXR - Labs done at her physical appointment but has not reviewed the results     Physical Exam   Physical Exam Vitals reviewed.  Constitutional:      General: She is not in acute distress.    Appearance: Normal appearance.  HENT:     Head: Normocephalic and atraumatic.  Cardiovascular:     Rate and Rhythm: Normal rate and regular rhythm.     Pulses: Normal pulses.  Pulmonary:     Effort: Pulmonary effort is normal. No respiratory distress.  Skin:    General: Skin is warm and dry.  Neurological:     Mental Status: She is alert and oriented to person, place, and time.  Psychiatric:        Mood and Affect: Mood normal.        Behavior: Behavior normal.        Thought Content: Thought content normal.        Judgment: Judgment normal.    In office EKG showing NSR with SA, rate 69, normal axis.   Assessment & Plan   Assessment and Plan    Musculoskeletal chest pain (resolved) Resolved musculoskeletal chest pain. EKG showed normal sinus rhythm with sinus arrhythmia, a normal variant. No concerning findings on EKG. Unsure if she wants to do the x-ray or not. - Order chest x-ray if symptoms recur and she desires. - Advise her to notify if symptoms recur and x-ray is performed. - Reviewed lab results and answered all questions at the time of the appointment.    General Health Maintenance Pneumonia vaccine discussed but undecided. Colonoscopy for colon cancer screening due. Referral  initiated by gynecologist. - Discuss pneumonia vaccine decision at next visit. - Ensure colonoscopy referral is completed.     Follow up   Return if symptoms worsen or fail to improve.  __________________________________ Zada FREDRIK Palin, DNP, APRN, FNP-BC Primary Care and Sports Medicine [facility] [place]

## 2024-06-02 ENCOUNTER — Ambulatory Visit: Admitting: Obstetrics & Gynecology

## 2024-06-02 ENCOUNTER — Encounter: Payer: Self-pay | Admitting: Obstetrics & Gynecology

## 2024-06-02 VITALS — BP 123/85 | HR 83 | Ht 65.5 in | Wt 313.1 lb

## 2024-06-02 DIAGNOSIS — G479 Sleep disorder, unspecified: Secondary | ICD-10-CM | POA: Diagnosis not present

## 2024-06-02 DIAGNOSIS — N951 Menopausal and female climacteric states: Secondary | ICD-10-CM

## 2024-06-02 DIAGNOSIS — N898 Other specified noninflammatory disorders of vagina: Secondary | ICD-10-CM

## 2024-06-02 DIAGNOSIS — R4189 Other symptoms and signs involving cognitive functions and awareness: Secondary | ICD-10-CM | POA: Diagnosis not present

## 2024-06-02 NOTE — Progress Notes (Signed)
   Subjective:    Patient ID: Lisa Tran, female    DOB: [birth year], 48 y.o.   MRN: [id number]  HPI  48 yo female presents for follow up after staring estrogen patch (has Mirena  for endometrial protection).  Nights sweats have subsided and had one hot flash during the day.  Very satisfied with results.    Review of Systems  Constitutional: Negative.   Respiratory: Negative.    Cardiovascular: Negative.   Gastrointestinal: Negative.   Genitourinary: Negative.        Objective:   Physical Exam Vitals reviewed.  Constitutional:      General: She is not in acute distress.    Appearance: She is well-developed.  HENT:     Head: Normocephalic and atraumatic.  Eyes:     Conjunctiva/sclera: Conjunctivae normal.  Cardiovascular:     Rate and Rhythm: Normal rate.  Pulmonary:     Effort: Pulmonary effort is normal.  Skin:    General: Skin is warm and dry.  Neurological:     Mental Status: She is alert and oriented to person, place, and time.  Psychiatric:        Mood and Affect: Mood normal.    Vitals:   06/02/24 0846  BP: 123/85  Pulse: 83  Weight: (!) 313 lb 1.3 oz (142 kg)  Height: 5' 5.5 (1.664 m)      Assessment & Plan:  48 yo female doing   Continue Vivelle  at 0.25 Continue IUD Brain fog -- uses oura ring to monitor sleep; will  try magnesium glycinate; Wakes up at 2 a.m. and difficulty going back to sleep.  Will f/u with PCP Maximize calcium and vitamin D , weight bearing exercise; pt has had gastric bypass--consider early Dexa.  Vaginal dryness--Replens and uberlube working well.   I provided 30 minutes of verbal and non-verbal time during this encounter date, time was needed to gather information, review chart, records, communicate/coordinate with staff, as well as complete documentation.

## 2024-06-05 ENCOUNTER — Encounter: Payer: Self-pay | Admitting: Bariatrics

## 2024-06-05 ENCOUNTER — Ambulatory Visit: Admitting: Bariatrics

## 2024-06-05 VITALS — BP 110/76 | HR 70 | Temp 97.7°F | Ht 65.5 in | Wt 306.0 lb

## 2024-06-05 DIAGNOSIS — E559 Vitamin D deficiency, unspecified: Secondary | ICD-10-CM

## 2024-06-05 DIAGNOSIS — R0602 Shortness of breath: Secondary | ICD-10-CM | POA: Diagnosis not present

## 2024-06-05 DIAGNOSIS — R7309 Other abnormal glucose: Secondary | ICD-10-CM | POA: Diagnosis not present

## 2024-06-05 DIAGNOSIS — R5383 Other fatigue: Secondary | ICD-10-CM

## 2024-06-05 DIAGNOSIS — Z6841 Body Mass Index (BMI) 40.0 and over, adult: Secondary | ICD-10-CM

## 2024-06-05 DIAGNOSIS — Z1331 Encounter for screening for depression: Secondary | ICD-10-CM

## 2024-06-05 DIAGNOSIS — Z9884 Bariatric surgery status: Secondary | ICD-10-CM

## 2024-06-05 NOTE — Progress Notes (Signed)
 At a Glance:  Vitals Temp: 97.7 F (36.5 C) BP: 110/76 Pulse Rate: 70 SpO2: 98 %   Anthropometric Measurements Height: 5' 5.5 (1.664 m) Weight: (!) 306 lb (138.8 kg) BMI (Calculated): 50.13 Starting Weight: 306lb Peak Weight: 400lb Waist Measurement : 53 inches   Body Composition  Body Fat %: 54.4 % Fat Mass (lbs): 166.8 lbs Muscle Mass (lbs): 133 lbs Total Body Water (lbs): 105.4 lbs Visceral Fat Rating : 19   Other Clinical Data RMR: 1786 Fasting: yes Labs: yes Today's Visit #: 1 Starting Date: 06/05/24     Indirect Calorimeter:   Resting Metabolic Rate ( RMR):  RMR (actual): 1786 kcal RMR (calculated): 2054 kcal The calculated basal metabolic rate is 7945 kcal  thus her basal metabolic rate is worse than expected.  Plan:   Indirect calorimeter completed, interpreted and reviewed with patient today and allowed to ask questions.  Discussed the implications for the chosen plan and exercise based on the RMR reading.  Will consider repeating the RMR in the future based on weight loss.    Chief Complaint:  Obesity   Subjective:  Lisa Tran (MR# [id number]) is a 48 y.o. female who presents for evaluation and treatment of obesity and related comorbidities.   Lisa Tran is currently in the action stage of change and ready to dedicate time achieving and maintaining a healthier weight. Lisa Tran is interested in becoming our patient and working on intensive lifestyle modifications including (but not limited to) diet and exercise for weight loss.  Lisa Tran has been struggling with her weight. She has been unsuccessful in either losing weight, maintaining weight loss, or reaching her healthy weight goal.  Lisa Tran's habits were reviewed today and are as follows: she thinks her family will eat healthier with her, she has been heavy most of her life, she has significant food cravings issues, she snacks frequently in the evenings, she skips meals frequently, and she  frequently makes poor food choices.  Current or previous pharmacotherapy: None  Response to medication: Never tried medications  Other Fatigue Lisa Tran admits to daytime somnolence and admits to waking up still tired. Lisa Tran generally gets 6 or 7 hours of sleep per night, and states that she has difficulty falling back asleep if awakened. Snoring is present. Apneic episodes are present. Epworth Sleepiness Score is 7.   Shortness of Breath Lisa Tran notes increasing shortness of breath with exercising and seems to be worsening over time with weight gain. She notes getting out of breath sooner with activity than she used to. This has gotten worse recently. Lisa Tran denies shortness of breath at rest or orthopnea.  Depression Screen Lisa Tran's Food and Mood (modified PHQ-9) score was 14. 10-14 moderate depression     04/07/2024    4:49 PM  Depression screen PHQ 2/9  Decreased Interest 0  Down, Depressed, Hopeless 1  PHQ - 2 Score 1  Altered sleeping 2  Tired, decreased energy 1  Change in appetite 0  Feeling bad or failure about yourself  0  Trouble concentrating 1  Moving slowly or fidgety/restless 1  Suicidal thoughts 0  PHQ-9 Score 6     Assessment and Plan:   Other Fatigue Lisa Tran does feel that her weight is causing her energy to be lower than it should be. Fatigue may be related to obesity, depression or many other causes. Labs will be ordered, and in the meanwhile, [name] will focus on self care including making healthy food choices, increasing physical activity and focusing on stress reduction.  Shortness of Breath Lisa Tran does feel that she gets out of breath more easily that she used to when she exercises. Lisa Tran's shortness of breath appears to be obesity related and exercise induced. She has agreed to work on weight loss and gradually increase exercise to treat her exercise induced shortness of breath. Will continue to monitor closely.  Health Maintenance:   Obesity    Plan: Will do indirect calorimetry, and labs.     Vitamin D  Deficiency Vitamin D  is at goal of 50.  Most recent vitamin D  level was 61.4. She is at risk for vitamin D  deficiency due to obesity.  She is on vitamin D  10,000 international units daily Lab Results  Component Value Date   VD25OH 61.4 05/21/2024   VD25OH 72.3 07/25/2022   VD25OH 72 02/09/2022    Plan: Will continue vitamin D    History of Bariatric Surgery (stomach stapling)  Dr/Facility/State: [facility] Year: 2001 Complications: no complications.  Highest Weight: 403 lbs Lowest Weight:  200 lbs Taking vitamins:  MV , vitamin D , Omega 3, and Magnesium glycinate.  Hx of deficiencies: none  Hx of iron infusions: none  Plan Counseling You may need to eat 3 meals and 2 snacks, or 5 small meals each day in order to reach your protein and calorie goals.  Allow at least 15 minutes for each meal so that you can eat mindfully. Listen to your body so that you do not overeat. For most people, your sleeve or pouch will comfortably hold 4-6 ounces. Eat foods from all food groups. This includes fruits and vegetables, grains, dairy, and meat and other proteins. Include a protein-rich food at every meal and snack, and eat the protein food first.  You should be taking a Bariatric Multivitamin as well as calcium.     Elevated glucose:   History of slightly elevated glucose  Plan: Will check her insulin  level.    [name] had a positive depression screening. Depression is commonly associated with obesity and often results in emotional eating behaviors. We will monitor this closely and work on CBT to help improve the non-hunger eating patterns. Referral to Psychology may be required if no improvement is seen as she continues in our clinic.   Previous labs reviewed today. Date: 05/21/24 CMP, Lipids, HgbA1c, Vit D, Vit B12, and TSH, iron/anemia panel,   Labs done today Insulin    Morbid Obesity: BMI (Calculated):  50.13   Lisa Tran is currently in the action stage of change and her goal is to begin weight loss efforts. I recommend Lisa Tran begin the structured treatment plan as follows:  She has agreed to Category 2 Plan  Exercise goals: All adults should avoid inactivity. Some activity is better than none, and adults who participate in any amount of physical activity, gain some health benefits.  Behavioral modification strategies:increasing lean protein intake, decreasing simple carbohydrates, increasing vegetables, increase H2O intake, no skipping meals, keeping healthy foods in the home, avoiding temptations, and planning for success  She was informed of the importance of frequent follow-up visits to maximize her success with intensive lifestyle modifications for her multiple health conditions. She was informed we would discuss her lab results at her next visit unless there is a critical issue that needs to be addressed sooner. Lisa Tran agreed to keep her next visit at the agreed upon time to discuss these results.  Objective:  General: Cooperative, alert, well developed, in no acute distress. HEENT: Conjunctivae and lids unremarkable. Cardiovascular: Regular rhythm.  Lungs: Normal work of breathing.  Neurologic: No focal deficits.   Lab Results  Component Value Date   CREATININE 0.66 05/21/2024   BUN 13 05/21/2024   NA 137 05/21/2024   K 4.9 05/21/2024   CL 100 05/21/2024   CO2 24 05/21/2024   Lab Results  Component Value Date   ALT 18 05/21/2024   AST 23 05/21/2024   ALKPHOS 76 05/21/2024   BILITOT 0.4 05/21/2024   Lab Results  Component Value Date   HGBA1C 5.6 05/21/2024   HGBA1C 5.4 07/25/2022   HGBA1C 5.4 02/09/2022   Lab Results  Component Value Date   INSULIN  3.8 07/25/2022   Lab Results  Component Value Date   TSH 1.710 05/21/2024   Lab Results  Component Value Date   CHOL 171 05/21/2024   HDL 84 05/21/2024   LDLCALC 78 05/21/2024   TRIG 44 05/21/2024   CHOLHDL  2.0 05/21/2024   Lab Results  Component Value Date   WBC 3.5 05/21/2024   HGB 12.7 05/21/2024   HCT 39.0 05/21/2024   MCV 91 05/21/2024   PLT 361 05/21/2024   Lab Results  Component Value Date   IRON 84 05/21/2024   TIBC 288 05/21/2024   FERRITIN 152 (H) 05/21/2024    Attestation Statements:  Applicable history such as the following:  allergies, medications, problem list, medical history, surgical history, family history, social history, and previous encounter notes reviewed by clinician on day of visit:  Time spent on visit in care of the patient today including the items listed below was 44 minutes.    20 minutes were spent talking about the history, 20 minutes for face to face counseling implementing the plan, discussing the specifics of how to arrange meals, meal planning, water intake.   I spent face to face time discussing his/her plan, including breakfast, additional breakfast options, lunch, and dinner options, grocery list, and snacks.  I reviewed her indirect calorimetry. I discussed the implications for the diet plan.    Discussed the bio-impedence test (fat %, muscle mass, and water weight) and allowed the patient to ask questions.   Discussed the following information sheets: Category 2., 100 and 200 calorie snack sheets   I reviewed the labs which were ordered from her visit on 05/21/24,   I additionally spent time documenting, reviewing, and checking the codes before submitting.   This may have been prepared with the assistance of Engineer, civil (consulting).  Occasional wrong-word or sound-a-like substitutions may have occurred due to the inherent limitations of voice recognition software.    Clayborne Daring, DO

## 2024-06-06 LAB — INSULIN, RANDOM: INSULIN: 3.4 u[IU]/mL (ref 2.6–24.9)

## 2024-06-26 ENCOUNTER — Encounter: Payer: Self-pay | Admitting: Bariatrics

## 2024-06-26 ENCOUNTER — Ambulatory Visit (INDEPENDENT_AMBULATORY_CARE_PROVIDER_SITE_OTHER): Admitting: Bariatrics

## 2024-06-26 VITALS — BP 113/72 | HR 75 | Temp 97.8°F | Ht 65.5 in | Wt 304.0 lb

## 2024-06-26 DIAGNOSIS — Z9884 Bariatric surgery status: Secondary | ICD-10-CM

## 2024-06-26 DIAGNOSIS — Z6841 Body Mass Index (BMI) 40.0 and over, adult: Secondary | ICD-10-CM

## 2024-06-26 DIAGNOSIS — E611 Iron deficiency: Secondary | ICD-10-CM | POA: Diagnosis not present

## 2024-06-26 NOTE — Progress Notes (Signed)
 First follow-up after initial visit.        WEIGHT SUMMARY AND BIOMETRICS  Weight Lost Since Last Visit: 2lb  Weight Gained Since Last Visit: 0   Vitals Temp: 97.8 F (36.6 C) BP: 113/72 Pulse Rate: 75 SpO2: 98 %   Anthropometric Measurements Height: 5' 5.5 (1.664 m) Weight: (!) 304 lb (137.9 kg) BMI (Calculated): 49.8 Weight at Last Visit: 306lb Weight Lost Since Last Visit: 2lb Weight Gained Since Last Visit: 0 Starting Weight: 306lb Total Weight Loss (lbs): 2 lb (0.907 kg) Peak Weight: 400lb Waist Measurement : 53 inches   Body Composition  Body Fat %: 51.1 % Fat Mass (lbs): 155.6 lbs Muscle Mass (lbs): 141.4 lbs Total Body Water (lbs): 114.8 lbs Visceral Fat Rating : 19   Other Clinical Data Fasting: no Labs: no Today's Visit #: 2 Starting Date: 06/05/24    OBESITY Lucresia is here to discuss her progress with her obesity treatment plan along with follow-up of her obesity related diagnoses.    Nutrition Plan: the Category 2 plan - 0% adherence.  Current exercise: walking  Interim History:  She is down 2 lbs since her last visit.  Not eating all of the food on the plan., Protein intake is as prescribed, Is not skipping meals, and Water intake is adequate.  Initial positives regarding the dietary plan: Working more and some weight training.  Initial challenges regarding  the dietary plan: She had to travel for work (unable to track).   Pharmacotherapy: She is not on any anti-obesity Hunger is moderately controlled.  Cravings are moderately controlled.  Assessment/Plan:   H/O gastric bypass:   Dr/Facility/State: [facility] Year: 2001 Complications: no complications.  Highest Weight: 403 lbs Lowest Weight:  200 lbs Taking vitamins:  MV , vitamin D , Omega 3, and Magnesium glycinate.  Hx of deficiencies: none  Hx of iron  infusions: none  Plan:  Will eat small, frequent meals.  Will stay with the plan and exercise.  Handouts: Travel sheet , and Foods high in fiber.   Iron deficiency:   She is not currently taking iron.  Anemia panel, ferritin, and CBC were done  Plan:   Stable at this time.    Morbid Obesity: Current BMI BMI (Calculated): 49.8   Pharmacotherapy Plan She wants to try to lose without weight without medication.   Marylan is currently in the action stage of change. As such, her goal is to continue with weight loss efforts.  She has agreed to the Category 2 plan.  Exercise goals: All adults should avoid inactivity. Some physical activity is better than none, and adults who participate in any amount of physical activity gain some health benefits.  Behavioral modification strategies: increasing lean protein intake, no meal skipping, decrease eating out, meal planning , increase water intake, better snacking choices, planning for success, increasing vegetables, increasing fiber rich foods, avoiding temptations, keep healthy foods in the home, and weigh protein portions.  Arneshia has agreed to follow-up with our clinic in 2 weeks.   Labs reviewed today from last visit (CMP, Lipids, HgbA1c, insulin , vitamin D , B 12, anemia panel, CBC,  and TSH).   Objective:   VITALS: Per patient if applicable, see vitals. GENERAL: Alert and in no acute distress. CARDIOPULMONARY: No increased WOB. Speaking in clear sentences.  PSYCH: Pleasant and cooperative. Speech normal rate and rhythm. Affect is appropriate. Insight and judgement are appropriate. Attention is focused, linear, and appropriate.  NEURO: Oriented as arrived to appointment on time with no  prompting.   Attestation Statements:   This was prepared with the assistance of Engineer, civil (consulting).  Occasional wrong-word or sound-a-like substitutions may have occurred due to the inherent limitations of voice recognition software.    Clayborne Daring, DO

## 2024-07-07 ENCOUNTER — Ambulatory Visit
Admission: EM | Admit: 2024-07-07 | Discharge: 2024-07-07 | Disposition: A | Discharge status: Home / Self Care | Attending: Emergency Medicine | Admitting: Emergency Medicine

## 2024-07-07 DIAGNOSIS — M5441 Lumbago with sciatica, right side: Secondary | ICD-10-CM | POA: Diagnosis not present

## 2024-07-07 MED ORDER — CYCLOBENZAPRINE HCL 10 MG PO TABS: 10.0000 mg | ORAL_TABLET | Freq: Every evening | ORAL | 0 refills | Status: AC

## 2024-07-07 MED ORDER — METHYLPREDNISOLONE SODIUM SUCC 125 MG IJ SOLR
60.0000 mg | Freq: Once | INTRAMUSCULAR | Status: AC
  Administered 2024-07-07: 60 mg via INTRAMUSCULAR

## 2024-07-07 MED ORDER — PREDNISONE 20 MG PO TABS
40.0000 mg | ORAL_TABLET | Freq: Every day | ORAL | 0 refills | Status: AC
Start: 2024-07-08 — End: 2024-07-13

## 2024-07-07 NOTE — ED Triage Notes (Addendum)
 Pt presents to [facility] with co right sided back pain/ buttock pain since last Monday pt reports she was working out using the leg press and she thinks she aggravated her sciatic nerve. Pt has attempted tyelnol and aleeve with minimal improvement.

## 2024-07-07 NOTE — Discharge Instructions (Addendum)
 The steroid injection (solumedrol) given today should start to work in about 30 minutes to reduce pain and inflammation.  Starting tomorrow morning, start the oral steroid (prednisone ). Please do not use any NSAIDs (ibuprofen /Advil , naproxen/Aleve, etc) while taking the prednisone . You can safely use tylenol .   You can take the muscle relaxer Flexeril  twice daily. If the medication makes you drowsy, take only at bed time.  Please follow up with sports medicine if symptoms are persisting or return

## 2024-07-07 NOTE — ED Provider Notes (Signed)
 [facility]    CSN: [id number] Arrival date & time: 07/07/24  0805      History   Chief Complaint Chief Complaint  Patient presents with   Back Pain    HPI Lisa Tran is a 48 y.o. female.  1 week history of right side low back pain that radiates into the right glute. With certain movements will radiate down leg. Started after she was using the leg press. Has attempted tylenol  and aleve  No weakness, paresthesias, bladder/bowel dysfunction  Does have history of back pain, low back injury years ago  PCP appointment is next week  Past Medical History:  Diagnosis Date   Allergy    Anemia    Anxiety    Asthma    Back pain    Back pain    Food allergy    Joint pain    Osteoarthritis    Swelling of lower extremity    Vitamin D  deficiency     Patient Active Problem List   Diagnosis Date Noted   Adjustment disorder 10/18/2022   SOB (shortness of breath) on exertion 07/25/2022   Vitamin D  deficiency 07/25/2022   Elevated glucose 07/25/2022   Eating disorder 07/25/2022   Other fatigue 07/12/2022   Class 3 severe obesity with serious comorbidity and body mass index (BMI) of 45.0 to 49.9 in adult 07/12/2022   Primary osteoarthritis of both knees 06/21/2022   Family history of colon cancer 09/23/2020   Fibroids 10/15/2017   Cervical paraspinal muscle spasm 10/05/2015   Iron deficiency 03/16/2015   Morbid obesity (HCC) 02/25/2015   H/O gastric bypass 02/25/2015    Past Surgical History:  Procedure Laterality Date   COSMETIC SURGERY     GASTRIC BYPASS  2003   KNEE SURGERY  2014    OB History     Gravida  1   Para      Term      Preterm      AB  1   Living         SAB      IAB      Ectopic      Multiple      Live Births               Home Medications    Prior to Admission medications   Medication Sig Start Date End Date Taking? Authorizing Provider  cyclobenzaprine  (FLEXERIL ) 10 MG tablet Take 1 tablet (10 mg  total) by mouth at bedtime. 07/07/24  Yes Mickaela Starlin, [place], PA-C  predniSONE  (DELTASONE ) 20 MG tablet Take 2 tablets (40 mg total) by mouth daily with breakfast for 5 days. 07/08/24 07/13/24 Yes Rockney Grenz, [place], PA-C  albuterol  (VENTOLIN  HFA) 108 (90 Base) MCG/ACT inhaler Inhale 2 puffs into the lungs every 6 (six) hours as needed. For shortness of breath or wheezing 05/21/24   Willo Mini, NP  cetirizine (ZYRTEC) 10 MG tablet Take 10 mg by mouth daily.    [provider]  Cholecalciferol (VITAMIN D3) 1000 units CAPS Take by mouth.    [provider]  EPINEPHrine  (EPIPEN  2-PAK) 0.3 mg/0.3 mL IJ SOAJ injection Inject 0.3 mg into the muscle as needed for anaphylaxis. 05/21/24   Willo Mini, NP  estradiol  (VIVELLE -DOT) 0.025 MG/24HR Place 1 patch onto the skin 2 (two) times a week. 04/21/24   Cris Burnard DEL, MD  Magnesium Glycinate 120 MG CAPS Take by mouth.    [provider]  Multiple Vitamin (MULTIVITAMIN WITH MINERALS) TABS Take  1 tablet by mouth daily.    [provider]  Omega-3 Fatty Acids (FISH OIL PO) Take by mouth.    [provider]  Probiotic Product (PROBIOTIC PO) Take by mouth.    [provider]  UNABLE TO FIND 500 mg. Med Name: EGGSHELL MEMBRANE    [provider]    Family History Family History  Problem Relation Age of Onset   Asthma Mother    Sudden death Mother    Obesity Mother    Diabetes Maternal Grandfather    Prostate cancer Maternal Grandfather        prostate   Diabetes Maternal Aunt    Hypertension Maternal Aunt     Social History Social History   Tobacco Use   Smoking status: Never   Smokeless tobacco: Never  Vaping Use   Vaping status: Never Used  Substance Use Topics   Alcohol use: Yes    Comment: occ   Drug use: No     Allergies   Shellfish allergy and Fish allergy   Review of Systems Review of Systems  As per HPI  Physical Exam Triage Vital Signs ED Triage Vitals  Encounter  Vitals Group     BP      Girls Systolic BP Percentile      Girls Diastolic BP Percentile      Boys Systolic BP Percentile      Boys Diastolic BP Percentile      Pulse      Resp      Temp      Temp src      SpO2      Weight      Height      Head Circumference      Peak Flow      Pain Score      Pain Loc      Pain Education      Exclude from Growth Chart    No data found.  Updated Vital Signs BP (!) 144/93   Pulse 92   Temp 97.7 F (36.5 C)   Resp 18   SpO2 98%    Physical Exam Vitals and nursing note reviewed.  Constitutional:      General: She is not in acute distress. HENT:     Mouth/Throat:     Mouth: Mucous membranes are moist.     Pharynx: Oropharynx is clear.  Eyes:     Conjunctiva/sclera: Conjunctivae normal.     Pupils: Pupils are equal, round, and reactive to light.  Cardiovascular:     Rate and Rhythm: Normal rate and regular rhythm.     Pulses: Normal pulses.     Heart sounds: Normal heart sounds.  Pulmonary:     Effort: Pulmonary effort is normal.     Breath sounds: Normal breath sounds.  Musculoskeletal:     Cervical back: Normal range of motion. No rigidity or tenderness.     Thoracic back: No bony tenderness.     Lumbar back: No swelling, deformity or bony tenderness. Positive right straight leg raise test.  Skin:    General: Skin is warm and dry.     Findings: No ecchymosis, erythema, lesion or rash.  Neurological:     General: No focal deficit present.     Mental Status: She is alert and oriented to person, place, and time.     Cranial Nerves: Cranial nerves 2-12 are intact. No cranial nerve deficit.     Sensory: Sensation is intact.  Motor: Motor function is intact. No weakness.     Coordination: Coordination is intact.     Gait: Gait is intact.     Deep Tendon Reflexes: Reflexes are normal and symmetric.     Comments: Strength 5/5. Sensation intact throughout      UC Treatments / Results  Labs (all labs ordered are listed,  but only abnormal results are displayed) Labs Reviewed - No data to display  EKG   Radiology No results found.  Procedures Procedures (including critical care time)  Medications Ordered in UC Medications  methylPREDNISolone sodium succinate (SOLU-MEDROL) 125 mg/2 mL injection 60 mg (60 mg Intramuscular Given 07/07/24 0851)    Initial Impression / Assessment and Plan / UC Course  I have reviewed the triage vital signs and the nursing notes.  Pertinent labs & imaging results that were available during my care of the patient were reviewed by me and considered in my medical decision making (see chart for details).  Reviewed labs from 1 month ago  Stable vitals, neurologically intact Right side low back pain with right sciatica Offered IM steroid, patient agreeable. Starting tomorrow prednisone  burst. Advised no NSAIDs while taking. Flexeril  with drowsy precautions Follow with sports med/PCP Return and ED precautions   Final Clinical Impressions(s) / UC Diagnoses   Final diagnoses:  Acute right-sided low back pain with right-sided sciatica     Discharge Instructions      The steroid injection (solumedrol) given today should start to work in about 30 minutes to reduce pain and inflammation.  Starting tomorrow morning, start the oral steroid (prednisone ). Please do not use any NSAIDs (ibuprofen /Advil , naproxen/Aleve, etc) while taking the prednisone . You can safely use tylenol .   You can take the muscle relaxer Flexeril  twice daily. If the medication makes you drowsy, take only at bed time.  Please follow up with sports medicine if symptoms are persisting or return      ED Prescriptions     Medication Sig Dispense Auth. Provider   predniSONE  (DELTASONE ) 20 MG tablet Take 2 tablets (40 mg total) by mouth daily with breakfast for 5 days. 10 tablet Larrisha Babineau, PA-C   cyclobenzaprine  (FLEXERIL ) 10 MG tablet Take 1 tablet (10 mg total) by mouth at bedtime. 10 tablet  Shatera Rennert, [place], PA-C      PDMP not reviewed this encounter.   Jae Bruck, [place], PA-C 07/07/24 [id number]

## 2024-07-16 ENCOUNTER — Ambulatory Visit: Admitting: Bariatrics

## 2024-07-23 ENCOUNTER — Ambulatory Visit: Admitting: Bariatrics

## 2024-07-23 ENCOUNTER — Encounter: Payer: Self-pay | Admitting: Bariatrics

## 2024-07-23 VITALS — BP 127/83 | HR 91 | Temp 97.6°F | Ht 65.5 in | Wt 298.0 lb

## 2024-07-23 DIAGNOSIS — R632 Polyphagia: Secondary | ICD-10-CM | POA: Diagnosis not present

## 2024-07-23 DIAGNOSIS — Z6841 Body Mass Index (BMI) 40.0 and over, adult: Secondary | ICD-10-CM

## 2024-07-23 DIAGNOSIS — Z9884 Bariatric surgery status: Secondary | ICD-10-CM

## 2024-07-23 NOTE — Progress Notes (Signed)
 WEIGHT SUMMARY AND BIOMETRICS  Weight Lost Since Last Visit: 6lb  Weight Gained Since Last Visit: 0   Vitals Temp: 97.6 F (36.4 C) BP: 127/83 Pulse Rate: 91 SpO2: 99 %   Anthropometric Measurements Height: 5' 5.5 (1.664 m) Weight: 298 lb (135.2 kg) BMI (Calculated): 48.82 Weight at Last Visit: 304lb Weight Lost Since Last Visit: 6lb Weight Gained Since Last Visit: 0 Starting Weight: 306lb Total Weight Loss (lbs): 8 lb (3.629 kg) Peak Weight: 400lb Waist Measurement : 53 inches   Body Composition  Body Fat %: 53.4 % Fat Mass (lbs): 159.2 lbs Muscle Mass (lbs): 131.8 lbs Total Body Water (lbs): 104.4 lbs Visceral Fat Rating : 19   Other Clinical Data Fasting: yes Labs: no Today's Visit #: 3 Starting Date: 06/05/24    OBESITY Lisa Tran is here to discuss her progress with her obesity treatment plan along with follow-up of her obesity related diagnoses.    Nutrition Plan: the Category 2 plan - 50% adherence.  Current exercise: walking and weightlifting  Interim History:  She is down 6 lbs since her last visit. She states that she is struggling with her calorie limit. She is doing more exercise. She is tracking more.  Eating all of the food on the plan., Protein intake is as prescribed, Journaling consistently., and Water intake is adequate.   Pharmacotherapy: Lisa Tran is on no anti-obesity  Hunger is moderately controlled.  Cravings are moderately controlled.  Assessment/Plan:   Lisa Tran endorses excessive hunger.  Medication(s) none Effects of medication (appetite):  moderately controlled. Cravings are moderately controlled.   Plan: Medication(s): none Will increase water, protein and fiber to help assuage hunger.  Will minimize foods that have a high glucose index/load to minimize reactive hypoglycemia.  Will journal her  protein and calories.  Will continue both cardio and resistance. Will increase fiber in her diet. Fiber sheet handout given.   H/O of gastric bypass:  She still has some restriction   Plan: Will have small, frequent meals.  Will have chia seeds in the afternoon with a shake.    Morbid Obesity: Current BMI BMI (Calculated): 48.82   Pharmacotherapy Plan No anti-obesity medications.   Lisa Tran is currently in the action stage of change. As such, her goal is to continue with weight loss efforts.  She has agreed to keeping a food journal with goal of 1,200 to 1,500  calories and 90 to 120  grams of protein daily.  Exercise goals: All adults should avoid inactivity. Some physical activity is better than none, and adults who participate in any amount of physical activity gain some health benefits.  Behavioral modification strategies: increasing lean protein intake, decreasing simple carbohydrates , no meal skipping, meal planning , increase water intake, better snacking choices, planning for success, increasing vegetables, keep healthy foods in the home, increase frequency of journaling, and mindful eating.  Lisa Tran has agreed to follow-up with our clinic in 2 weeks.     Objective:   VITALS: Per patient if applicable, see vitals. GENERAL: Alert and in no acute distress. CARDIOPULMONARY: No increased WOB. Speaking in clear sentences.  PSYCH: Pleasant and cooperative. Speech normal rate and rhythm. Affect is appropriate. Insight and judgement are appropriate. Attention is focused, linear, and appropriate.  NEURO: Oriented as arrived to appointment on time with no prompting.   Attestation Statements:   This was prepared with the assistance of Engineer, civil (consulting).  Occasional wrong-word or sound-a-like substitutions may have occurred due to the inherent limitations of voice recognition   Clayborne Daring, DO

## 2024-08-12 ENCOUNTER — Ambulatory Visit: Admitting: Bariatrics

## 2024-08-19 ENCOUNTER — Encounter: Payer: Self-pay | Admitting: Bariatrics

## 2024-08-19 ENCOUNTER — Ambulatory Visit: Admitting: Bariatrics

## 2024-08-19 VITALS — BP 121/74 | HR 72 | Temp 98.2°F | Ht 65.5 in | Wt 302.0 lb

## 2024-08-19 DIAGNOSIS — Z6841 Body Mass Index (BMI) 40.0 and over, adult: Secondary | ICD-10-CM | POA: Diagnosis not present

## 2024-08-19 DIAGNOSIS — E66813 Obesity, class 3: Secondary | ICD-10-CM

## 2024-08-19 DIAGNOSIS — R632 Polyphagia: Secondary | ICD-10-CM | POA: Diagnosis not present

## 2024-08-19 NOTE — Progress Notes (Signed)
 WEIGHT SUMMARY AND BIOMETRICS  Weight Lost Since Last Visit: 0  Weight Gained Since Last Visit: 4lb   Vitals Temp: 98.2 F (36.8 C) BP: 121/74 Pulse Rate: 72 SpO2: 100 %   Anthropometric Measurements Height: 5' 5.5 (1.664 m) Weight: (!) 302 lb (137 kg) BMI (Calculated): 49.47 Weight at Last Visit: 298lb Weight Lost Since Last Visit: 0 Weight Gained Since Last Visit: 4lb Starting Weight: 306lb Total Weight Loss (lbs): 4 lb (1.814 kg) Peak Weight: 400lb   Body Composition  Body Fat %: 53.8 % Fat Mass (lbs): 162.8 lbs Muscle Mass (lbs): 132.8 lbs Total Body Water (lbs): 104.6 lbs Visceral Fat Rating : 19   Other Clinical Data Fasting: no Labs: no Today's Visit #: 4 Starting Date: 06/05/24    OBESITY Lisa Tran is here to discuss her progress with her obesity treatment plan along with follow-up of her obesity related diagnoses.    Nutrition Plan: the Category 2 plan - 40% adherence.  Current exercise: walking and weight lifting  Interim History:  She is up 4 lbs since her last visit. [place] and was not eating as well.  Eating all of the food on the plan., Protein intake is as prescribed, Is not skipping meals, and Water intake is adequate.   Pharmacotherapy: Lisa Tran is on not on any anti-obesity medications.  Hunger is moderately controlled.  Cravings are moderately controlled.  Assessment/Plan:   Lisa Tran endorses excessive hunger.  Medication(s): none Appetite:  moderately controlled. Cravings are moderately controlled.   Plan: Medication(s): none Will increase water, protein and fiber to help assuage hunger.  Will minimize foods that have a high glucose index/load to minimize reactive hypoglycemia.  Discussed the Travel sheet.    Morbid Obesity: Current BMI BMI (Calculated): 49.47   Pharmacotherapy  Plan No anti-obesity medications.   Lisa Tran is currently in the action stage of change. As such, her goal is to continue with weight loss efforts.  She has agreed to the Category 2 plan.  Exercise goals: All adults should avoid inactivity. Some physical activity is better than none, and adults who participate in any amount of physical activity gain some health benefits.  Behavioral modification strategies: increasing lean protein intake, no meal skipping, meal planning , increase water intake, increasing vegetables, avoiding temptations, keep healthy foods in the home, and increase frequency of journaling.  Lisa Tran has agreed to follow-up with our clinic in 3 weeks.     Objective:   VITALS: Per patient if applicable, see vitals. GENERAL: Alert and in no acute distress. CARDIOPULMONARY: No increased WOB. Speaking in clear sentences.  PSYCH: Pleasant and cooperative. Speech normal rate and rhythm. Affect is appropriate. Insight and judgement are appropriate. Attention is focused, linear, and appropriate.  NEURO: Oriented as arrived to appointment on time with no prompting.   Attestation Statements:   This was prepared with  the assistance of Engineer, Civil (consulting).  Occasional wrong-word or sound-a-like substitutions may have occurred due to the inherent limitations of voice recognition   Clayborne Daring, DO

## 2024-08-20 ENCOUNTER — Ambulatory Visit: Admitting: Bariatrics

## 2024-09-08 ENCOUNTER — Ambulatory Visit: Admitting: Bariatrics

## 2024-09-08 ENCOUNTER — Encounter: Payer: Self-pay | Admitting: Bariatrics

## 2024-09-08 VITALS — BP 130/82 | HR 77 | Temp 97.5°F | Ht 65.5 in | Wt 295.0 lb

## 2024-09-08 DIAGNOSIS — Z6841 Body Mass Index (BMI) 40.0 and over, adult: Secondary | ICD-10-CM | POA: Diagnosis not present

## 2024-09-08 DIAGNOSIS — R632 Polyphagia: Secondary | ICD-10-CM | POA: Diagnosis not present

## 2024-09-08 NOTE — Progress Notes (Signed)
 WEIGHT SUMMARY AND BIOMETRICS  Weight Lost Since Last Visit: 7lb  Weight Gained Since Last Visit: 0   Vitals Temp: (!) 97.5 F (36.4 C) BP: 130/82 Pulse Rate: 77 SpO2: 100 %   Anthropometric Measurements Height: 5' 5.5 (1.664 m) Weight: 295 lb (133.8 kg) BMI (Calculated): 48.33 Weight at Last Visit: 302lb Weight Lost Since Last Visit: 7lb Weight Gained Since Last Visit: 0 Starting Weight: 306lb Total Weight Loss (lbs): 11 lb (4.99 kg) Peak Weight: 400lb   Body Composition  Body Fat %: 52.4 % Fat Mass (lbs): 155 lbs Muscle Mass (lbs): 133.8 lbs Total Body Water (lbs): 101.8 lbs Visceral Fat Rating : 19   Other Clinical Data Fasting: yes Labs: no Today's Visit #: 5 Starting Date: 06/05/24    OBESITY Lisa Tran is here to discuss her progress with her obesity treatment plan along with follow-up of her obesity related diagnoses.    Nutrition Plan: the Category 2 plan - 70% adherence.  Current exercise: walking and weightlifting  Interim History:  She is down 7 lbs since her last visit.  Eating all of the food on the plan., Protein intake is less than prescribed., and Water intake is adequate.   Pharmacotherapy: Lisa Tran is not on any anti-obesity medications.  Hunger is moderately controlled.  Cravings are moderately controlled.  Assessment/Plan:   Lisa Tran endorses occasional, excessive hunger, but mainly cravings. Medication(s): none Effects of medication:  moderately controlled. Cravings are moderately controlled.   Plan: Medication(s): none Will increase water, protein and fiber to help assuage hunger.  Will minimize foods that have a high glucose index/load to minimize reactive hypoglycemia.  Will focus on healthy fat and protein.  Will have a hot drink (no sugar) if she is having cravings.  Will have a protein shake in the  am to get in her protein.     Morbid Obesity: Current BMI BMI (Calculated): 48.33   Pharmacotherapy Plan She is not on any anti-obesity medications.   Lisa Tran is currently in the action stage of change. As such, her goal is to continue with weight loss efforts.  She has agreed to the Category 2 plan.  Exercise goals: All adults should avoid inactivity. Some physical activity is better than none, and adults who participate in any amount of physical activity gain some health benefits. She will continue the Zumba and other activities.   Behavioral modification strategies: increasing lean protein intake, no meal skipping, meal planning , planning for success, increasing fiber rich foods, avoiding temptations, keep healthy foods in the home, and increase frequency of journaling.  Lisa Tran has agreed to follow-up with our clinic in 4 weeks.   Objective:   VITALS: Per patient if applicable, see vitals. GENERAL: Alert and in no acute distress. CARDIOPULMONARY: No increased WOB. Speaking in clear sentences.  PSYCH: Pleasant and cooperative. Speech normal rate and rhythm. Affect is appropriate.  Insight and judgement are appropriate. Attention is focused, linear, and appropriate.  NEURO: Oriented as arrived to appointment on time with no prompting.   Attestation Statements:   This was prepared with the assistance of Engineer, Civil (consulting).  Occasional wrong-word or sound-a-like substitutions may have occurred due to the inherent limitations of voice recognition   Clayborne Daring, DO

## 2024-09-22 ENCOUNTER — Ambulatory Visit: Admitting: Obstetrics & Gynecology

## 2024-09-22 ENCOUNTER — Encounter: Payer: Self-pay | Admitting: Obstetrics & Gynecology

## 2024-09-22 ENCOUNTER — Other Ambulatory Visit (HOSPITAL_COMMUNITY)
Admission: RE | Admit: 2024-09-22 | Discharge: 2024-09-22 | Disposition: A | Source: Ambulatory Visit | Attending: Obstetrics & Gynecology | Admitting: Obstetrics & Gynecology

## 2024-09-22 VITALS — BP 115/80 | HR 74 | Ht 65.5 in | Wt 295.0 lb

## 2024-09-22 DIAGNOSIS — N941 Unspecified dyspareunia: Secondary | ICD-10-CM | POA: Diagnosis not present

## 2024-09-22 DIAGNOSIS — N9489 Other specified conditions associated with female genital organs and menstrual cycle: Secondary | ICD-10-CM

## 2024-09-22 DIAGNOSIS — N898 Other specified noninflammatory disorders of vagina: Secondary | ICD-10-CM

## 2024-09-22 NOTE — Progress Notes (Signed)
° °  Subjective:    Patient ID: Lisa Tran, female    DOB: 01-15-1976, 48 y.o.   MRN: [id number]  HPI  48 yo female presents for f/u of HRT.  Hot flashes are better.  Still has brain fog.  Vaginal dryness is worse--uberlube and Replens not working.  Denies discharge. 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  She also has pain with deep penetration.  It is starting to affect her libido.  She is in a monogamous relationship with her husband and feels that their relationship is good.  Attraction and emotional connection is not an issue.  Review of Systems  Constitutional: Negative.   Respiratory: Negative.    Cardiovascular: Negative.   Gastrointestinal: Negative.   Genitourinary:  Positive for dyspareunia, vaginal discharge and vaginal pain. Negative for vaginal bleeding.       Objective:   Physical Exam Vitals reviewed.  Constitutional:      General: She is not in acute distress.    Appearance: She is well-developed.  HENT:     Head: Normocephalic and atraumatic.  Eyes:     Conjunctiva/sclera: Conjunctivae normal.  Cardiovascular:     Rate and Rhythm: Normal rate.  Pulmonary:     Effort: Pulmonary effort is normal.  Genitourinary:    Comments: Tanner V Vulva:  No lesion--pain at vestibular glands; no redness Vagina:  Pink, no lesions, no discharge, no blood; some pain along vaginal sidewalls Cervix:  No CMT Uterus:  Non tender, mobile Right adnexa--non tender, no mass Left adnexa--non tender, no mass Skin:    General: Skin is warm and dry.  Neurological:     Mental Status: She is alert and oriented to person, place, and time.  Psychiatric:        Mood and Affect: Mood normal.    Vitals:   09/22/24 1512  BP: 115/80  Pulse: 74  Weight: 295 lb (133.8 kg)  Height: 5' 5.5 (1.664 m)      Assessment & Plan:  48 year old female on HRT and dyspareunia/vaginal  burning  Vivelle -Dot and IUD Vaginal discharge--rule out BV and yeast Dyspareunia--pelvic PT  RTC 3 months.   I provided 30 minutes of verbal and non-verbal time during this encounter date, time was needed to gather information, review chart, records, communicate/coordinate with staff, as well as complete documentation.

## 2024-09-23 LAB — CERVICOVAGINAL ANCILLARY ONLY
Bacterial Vaginitis (gardnerella): NEGATIVE
Candida Glabrata: NEGATIVE
Candida Vaginitis: NEGATIVE
Comment: NEGATIVE
Comment: NEGATIVE
Comment: NEGATIVE

## 2024-09-24 ENCOUNTER — Ambulatory Visit: Payer: Self-pay | Admitting: Obstetrics & Gynecology

## 2024-10-06 ENCOUNTER — Ambulatory Visit: Admitting: Bariatrics

## 2024-10-27 ENCOUNTER — Ambulatory Visit: Admitting: Bariatrics
